# Patient Record
Sex: Female | Born: 1955 | Race: White | Hispanic: No | Marital: Single | Smoking: Former smoker
Health system: Southern US, Community
[De-identification: ages and names within clinical notes are randomized; demographics above are authoritative.]

## PROBLEM LIST (undated history)

## (undated) DIAGNOSIS — Z923 Personal history of irradiation: Secondary | ICD-10-CM

## (undated) DIAGNOSIS — I7300 Raynaud's syndrome without gangrene: Secondary | ICD-10-CM

## (undated) DIAGNOSIS — C089 Malignant neoplasm of major salivary gland, unspecified: Secondary | ICD-10-CM

## (undated) DIAGNOSIS — F329 Major depressive disorder, single episode, unspecified: Secondary | ICD-10-CM

## (undated) DIAGNOSIS — F32A Depression, unspecified: Secondary | ICD-10-CM

## (undated) DIAGNOSIS — I10 Essential (primary) hypertension: Secondary | ICD-10-CM

## (undated) DIAGNOSIS — K589 Irritable bowel syndrome without diarrhea: Secondary | ICD-10-CM

## (undated) DIAGNOSIS — K219 Gastro-esophageal reflux disease without esophagitis: Secondary | ICD-10-CM

## (undated) DIAGNOSIS — K922 Gastrointestinal hemorrhage, unspecified: Secondary | ICD-10-CM

## (undated) DIAGNOSIS — E785 Hyperlipidemia, unspecified: Secondary | ICD-10-CM

## (undated) HISTORY — DX: Major depressive disorder, single episode, unspecified: F32.9

## (undated) HISTORY — DX: Essential (primary) hypertension: I10

## (undated) HISTORY — DX: Depression, unspecified: F32.A

## (undated) HISTORY — DX: Gastro-esophageal reflux disease without esophagitis: K21.9

## (undated) HISTORY — PX: PAROTIDECTOMY: SUR1003

## (undated) HISTORY — DX: Irritable bowel syndrome, unspecified: K58.9

## (undated) HISTORY — DX: Raynaud's syndrome without gangrene: I73.00

## (undated) HISTORY — DX: Hyperlipidemia, unspecified: E78.5

## (undated) HISTORY — DX: Malignant neoplasm of major salivary gland, unspecified: C08.9

---

## 1997-06-09 DIAGNOSIS — C089 Malignant neoplasm of major salivary gland, unspecified: Secondary | ICD-10-CM

## 1997-06-09 DIAGNOSIS — Z923 Personal history of irradiation: Secondary | ICD-10-CM

## 1997-06-09 HISTORY — DX: Personal history of irradiation: Z92.3

## 1997-06-09 HISTORY — DX: Malignant neoplasm of major salivary gland, unspecified: C08.9

## 1997-06-09 HISTORY — PX: SALIVARY GLAND SURGERY: SHX768

## 2002-06-09 HISTORY — PX: ABDOMINAL HYSTERECTOMY: SHX81

## 2004-10-21 ENCOUNTER — Ambulatory Visit: Payer: Self-pay | Admitting: Psychiatry

## 2005-08-05 ENCOUNTER — Inpatient Hospital Stay: Payer: Self-pay | Admitting: Obstetrics and Gynecology

## 2007-03-24 ENCOUNTER — Emergency Department: Payer: Self-pay | Admitting: Emergency Medicine

## 2008-10-09 ENCOUNTER — Ambulatory Visit: Payer: Self-pay | Admitting: Family Medicine

## 2008-12-05 ENCOUNTER — Emergency Department: Payer: Self-pay | Admitting: Emergency Medicine

## 2009-11-07 ENCOUNTER — Ambulatory Visit: Payer: Self-pay | Admitting: Internal Medicine

## 2009-12-17 LAB — HM DEXA SCAN

## 2010-03-05 ENCOUNTER — Ambulatory Visit: Payer: Self-pay | Admitting: Emergency Medicine

## 2010-05-13 ENCOUNTER — Ambulatory Visit: Payer: Self-pay | Admitting: Gastroenterology

## 2010-07-01 ENCOUNTER — Ambulatory Visit: Payer: Self-pay | Admitting: Gastroenterology

## 2011-01-07 ENCOUNTER — Ambulatory Visit: Payer: Self-pay | Admitting: Internal Medicine

## 2011-10-29 ENCOUNTER — Ambulatory Visit: Payer: Self-pay | Admitting: Unknown Physician Specialty

## 2011-11-06 ENCOUNTER — Encounter: Payer: Self-pay | Admitting: Family Medicine

## 2011-11-06 ENCOUNTER — Ambulatory Visit (INDEPENDENT_AMBULATORY_CARE_PROVIDER_SITE_OTHER): Payer: 59 | Admitting: Family Medicine

## 2011-11-06 VITALS — BP 130/90 | HR 88 | Temp 97.9°F | Ht 65.0 in | Wt 140.0 lb

## 2011-11-06 DIAGNOSIS — E785 Hyperlipidemia, unspecified: Secondary | ICD-10-CM | POA: Insufficient documentation

## 2011-11-06 DIAGNOSIS — K219 Gastro-esophageal reflux disease without esophagitis: Secondary | ICD-10-CM | POA: Insufficient documentation

## 2011-11-06 DIAGNOSIS — I10 Essential (primary) hypertension: Secondary | ICD-10-CM

## 2011-11-06 DIAGNOSIS — E119 Type 2 diabetes mellitus without complications: Secondary | ICD-10-CM

## 2011-11-06 DIAGNOSIS — F329 Major depressive disorder, single episode, unspecified: Secondary | ICD-10-CM

## 2011-11-06 DIAGNOSIS — F32A Depression, unspecified: Secondary | ICD-10-CM | POA: Insufficient documentation

## 2011-11-06 MED ORDER — HYDROCHLOROTHIAZIDE 12.5 MG PO CAPS: 12.5000 mg | ORAL_CAPSULE | Freq: Every day | ORAL | Status: DC

## 2011-11-06 NOTE — Progress Notes (Signed)
Subjective:    Patient ID: Krystal Walker, female    DOB: Feb 13, 1956, 56 y.o.   MRN: [id number]  HPI  56 yo here to establish care.  HTN- has been on and off medication for years.  Stopped taking everything because she felt overmedicated.  Last anti hypertensive was amlodipine. She does not think she had any adverse reactions to any of them.  Dr. Mikey Bussing office called and asked that we work her in because her BP was 158/101 in his office. She has been having more headaches. No blurred vision, CP. Does have some mild LE edema.  OA- undergoing PT for her right hip pain. Was told she could not participate in PT under BP under better control.  Depression- h/o prior mental health hospitalizations.  Most recently on effexor.  Feels symptoms are well controlled off medications. Would prefer "not to take man made medications when possible."  HLD- was previously on statin.  Advised to stop taking it because it "was causing muscle break down." Lipids normal in 05/2011.  Patient Active Problem List  Diagnoses  . Hypertension  . Hyperlipidemia  . Depression  . GERD (gastroesophageal reflux disease)   Past Medical History  Diagnosis Date  . Hypertension   . Hyperlipidemia   . Diabetes mellitus   . Depression   . IBS (irritable bowel syndrome)   . Osteoporosis   . Raynaud disease   . GERD (gastroesophageal reflux disease)   . 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 Abdominal hysterectomy    History  Substance Use Topics  . Smoking status: Former Games developer  . Smokeless tobacco: Not on file  . Alcohol Use: Not on file   No family history on file. Allergies  Allergen Reactions  . Morphine And Related Nausea And Vomiting   Current Outpatient Prescriptions on File Prior to Visit  Medication Sig Dispense Refill  . CALCIUM-VITAMIN D PO Take one by mouth daily      . topiramate (TOPAMAX) 25 MG tablet Take 25 mg by  mouth daily.      . hydrochlorothiazide (MICROZIDE) 12.5 MG capsule Take 1 capsule (12.5 mg total) by mouth daily.  30 capsule  1   The PMH, PSH, Social History, Family History, Medications, and allergies have been reviewed in [facility], and have been updated if relevant.   Review of Systems See HPI Patient reports no  vision/ hearing changes,anorexia, weight change, fever ,adenopathy, persistant / recurrent hoarseness, swallowing issues, chest pain, edema,persistant / recurrent cough, hemoptysis, dyspnea(rest, exertional, paroxysmal nocturnal), gastrointestinal  bleeding (melena, rectal bleeding)    Objective:   Physical Exam BP 130/90  Pulse 88  Temp(Src) 97.9 F (36.6 C) (Oral)  Ht 5\' 5"  (1.651 m)  Wt 140 lb (63.504 kg)  BMI 23.30 kg/m2  General:  Well-developed,well-nourished,in no acute distress; alert,appropriate and cooperative throughout examination Head:  normocephalic and atraumatic.   Eyes:  vision grossly intact, pupils equal, pupils round, and pupils reactive to light.   Ears:  R ear normal and L ear normal.   Nose:  no external deformity.   Mouth:  good dentition.   Neck:  No deformities, masses, or tenderness noted. Lungs:  Normal respiratory effort, chest expands symmetrically. Lungs are clear to auscultation, no crackles or wheezes. Heart:  Normal rate and regular rhythm. S1 and S2 normal without gallop, murmur, click, rub or other extra sounds. Abdomen:  Bowel sounds positive,abdomen soft and non-tender without  masses, organomegaly or hernias noted. Msk:  No deformity or scoliosis noted of thoracic or lumbar spine.   Extremities:  No clubbing, cyanosis, edema, or deformity noted with normal full range of motion of all joints.   Neurologic:  alert & oriented X3 and gait normal.   Skin:  Intact without suspicious lesions or rashes Cervical Nodes:  No lymphadenopathy noted Axillary Nodes:  No palpable lymphadenopathy Psych:  Cognition and judgment appear intact. Very  tangential speech, difficulty focusing on one topic.     Assessment & Plan:   1. Hypertension  Deteriorated although BP quite good in office today. >30 min spent with face to face with patient, >50% counseling and/or coordinating care Will start low dose HCTZ 12.5 mg daily. Follow up in one month.  2. Hyperlipidemia  Off statin. Recheck lipids in 3 months (will need order as she works for [employer]).  3. GERD (gastroesophageal reflux disease)  Stable.  4. Depression  Stable off meds.

## 2011-11-06 NOTE — Patient Instructions (Addendum)
It was nice to meet you. We are starting hydrochlorothiazide 12.5 mg daily. Please come back in 1 month for follow up blood pressure.  We will plan to make an appointment for a physical this summer.

## 2011-12-08 ENCOUNTER — Encounter: Payer: Self-pay | Admitting: Family Medicine

## 2011-12-08 ENCOUNTER — Ambulatory Visit (INDEPENDENT_AMBULATORY_CARE_PROVIDER_SITE_OTHER): Payer: 59 | Admitting: Family Medicine

## 2011-12-08 VITALS — BP 110/64 | HR 88 | Temp 97.9°F | Wt 138.2 lb

## 2011-12-08 DIAGNOSIS — I10 Essential (primary) hypertension: Secondary | ICD-10-CM

## 2011-12-08 MED ORDER — OMEPRAZOLE 40 MG PO CPDR: 40.0000 mg | DELAYED_RELEASE_CAPSULE | Freq: Every day | ORAL | Status: DC

## 2011-12-08 MED ORDER — HYDROCHLOROTHIAZIDE 12.5 MG PO CAPS: 12.5000 mg | ORAL_CAPSULE | Freq: Every day | ORAL | Status: DC

## 2011-12-08 NOTE — Progress Notes (Signed)
Subjective:    Patient ID: Krystal Walker, female    DOB: June 02, 1956, 56 y.o.   MRN: [id number]  HPI  56 yo here for follow up HTN.  HTN- has been on and off medication for years.  Stopped taking everything because she felt overmedicated.  Last anti hypertensive was amlodipine. She does not think she had any adverse reactions to any of them.  Started HCTZ 12.5 mg daily last month. Normotensive today.  Denies any Side effects. No CP, SOB, HA, blurred vision or LE edema.  Patient Active Problem List  Diagnosis  . Hypertension  . Hyperlipidemia  . Depression  . GERD (gastroesophageal reflux disease)   Past Medical History  Diagnosis Date  . Hypertension   . Hyperlipidemia   . Diabetes mellitus   . Depression   . IBS (irritable bowel syndrome)   . Osteoporosis   . Raynaud disease   . GERD (gastroesophageal reflux disease)   . 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 Abdominal hysterectomy    History  Substance Use Topics  . Smoking status: Former Games developer  . Smokeless tobacco: Not on file  . Alcohol Use: No   No family history on file. Allergies  Allergen Reactions  . Latex   . Morphine And Related Nausea And Vomiting   Current Outpatient Prescriptions on File Prior to Visit  Medication Sig Dispense Refill  . CALCIUM-VITAMIN D PO Take one by mouth daily      . hydrochlorothiazide (MICROZIDE) 12.5 MG capsule Take 1 capsule (12.5 mg total) by mouth daily.  30 capsule  1  . Multiple Vitamin (MULTIVITAMIN) tablet Take 1 tablet by mouth daily.      Marland Kitchen omeprazole (PRILOSEC) 40 MG capsule Take 40 mg by mouth daily.      . Probiotic Product (ALIGN PO) Take by mouth.      . topiramate (TOPAMAX) 25 MG tablet Take 25 mg by mouth daily.       The PMH, PSH, Social History, Family History, Medications, and allergies have been reviewed in [facility], and have been updated if relevant.   Review of Systems See HPI       Objective:   Physical Exam BP 110/64  Pulse 88  Temp 97.9 F (36.6 C) (Oral)  Wt 138 lb 4 oz (62.71 kg)  General:  Well-developed,well-nourished,in no acute distress; alert,appropriate and cooperative throughout examination Head:  normocephalic and atraumatic.   Eyes:  vision grossly intact, pupils equal, pupils round, and pupils reactive to light.   Ears:  R ear normal and L ear normal.   Nose:  no external deformity.   Mouth:  good dentition.   Neck:  No deformities, masses, or tenderness noted. Lungs:  Normal respiratory effort, chest expands symmetrically. Lungs are clear to auscultation, no crackles or wheezes. Heart:  Normal rate and regular rhythm. S1 and S2 normal without gallop, murmur, click, rub or other extra sounds. Extremities:  No clubbing, cyanosis, edema, or deformity noted with normal full range of motion of all joints.   Neurologic:  alert & oriented X3 and gait normal.   Skin:  Intact without suspicious lesions or rashes Psych:  Cognition and judgment appear intact. Very tangential speech, difficulty focusing on one topic.     Assessment & Plan:   1. Hypertension  Improved. Continue low dose HCTZ. Follow up prn. The patient indicates understanding of these issues and agrees with the plan.

## 2012-01-05 ENCOUNTER — Ambulatory Visit: Payer: Self-pay | Admitting: Family Medicine

## 2012-01-12 ENCOUNTER — Telehealth: Payer: Self-pay

## 2012-01-12 DIAGNOSIS — Z1231 Encounter for screening mammogram for malignant neoplasm of breast: Secondary | ICD-10-CM

## 2012-01-12 NOTE — Telephone Encounter (Signed)
Pt got notice from [facility] for pt to contact our office time for mammogram. Pt got letter in June and forgot to call office. Pt wants to know if she needs mammogram and when should she get CPX.Please advise.

## 2012-01-13 ENCOUNTER — Telehealth: Payer: Self-pay | Admitting: Family Medicine

## 2012-01-13 NOTE — Telephone Encounter (Signed)
Left message for patient to call back  

## 2012-01-13 NOTE — Telephone Encounter (Signed)
Patient scheduled her cpx for 03/15/12.  She has to have her lab work done at [facility].   She asked if you could mail the lab order to her.

## 2012-01-13 NOTE — Telephone Encounter (Signed)
Advised patient, call sent to [facility] to schedule.

## 2012-01-13 NOTE — Telephone Encounter (Signed)
Referral placed.   She established care with me in May, does not appear that she had physical this year at her previous doctor's office so yes she can schedule a physical.

## 2012-01-14 NOTE — Telephone Encounter (Signed)
Order mailed to patient.

## 2012-01-14 NOTE — Telephone Encounter (Signed)
Orders written, on my desk.

## 2012-01-20 ENCOUNTER — Ambulatory Visit: Payer: Self-pay | Admitting: Family Medicine

## 2012-01-21 ENCOUNTER — Encounter: Payer: Self-pay | Admitting: Family Medicine

## 2012-01-26 ENCOUNTER — Ambulatory Visit: Payer: Self-pay | Admitting: Family Medicine

## 2012-01-26 ENCOUNTER — Encounter: Payer: Self-pay | Admitting: Family Medicine

## 2012-01-27 ENCOUNTER — Other Ambulatory Visit: Payer: Self-pay | Admitting: Family Medicine

## 2012-01-27 DIAGNOSIS — R928 Other abnormal and inconclusive findings on diagnostic imaging of breast: Secondary | ICD-10-CM

## 2012-02-02 ENCOUNTER — Other Ambulatory Visit: Payer: Self-pay | Admitting: *Deleted

## 2012-02-02 MED ORDER — TOPIRAMATE 25 MG PO TABS: 25.0000 mg | ORAL_TABLET | Freq: Every day | ORAL | Status: DC

## 2012-02-02 NOTE — Telephone Encounter (Signed)
Pt requests refills on topiramate.

## 2012-02-13 ENCOUNTER — Telehealth: Payer: Self-pay | Admitting: Family Medicine

## 2012-02-13 MED ORDER — FLUCONAZOLE 150 MG PO TABS: 150.0000 mg | ORAL_TABLET | Freq: Once | ORAL | Status: AC

## 2012-02-13 NOTE — Telephone Encounter (Signed)
Rx for Diflucan sent to [facility].

## 2012-02-13 NOTE — Telephone Encounter (Signed)
Pt just got off of anti-biotics and has a yeast infection and was wondering if something could be called in to [facility] on [address]. For that.

## 2012-02-13 NOTE — Telephone Encounter (Signed)
Advised patient

## 2012-02-23 ENCOUNTER — Ambulatory Visit: Payer: Self-pay | Admitting: General Surgery

## 2012-02-23 HISTORY — PX: BREAST BIOPSY: SHX20

## 2012-02-24 LAB — PATHOLOGY REPORT

## 2012-03-02 ENCOUNTER — Encounter: Payer: Self-pay | Admitting: Family Medicine

## 2012-03-15 ENCOUNTER — Ambulatory Visit (INDEPENDENT_AMBULATORY_CARE_PROVIDER_SITE_OTHER): Payer: 59 | Admitting: Family Medicine

## 2012-03-15 ENCOUNTER — Encounter: Payer: Self-pay | Admitting: Family Medicine

## 2012-03-15 VITALS — BP 110/80 | HR 72 | Temp 97.7°F | Ht 65.0 in | Wt 130.0 lb

## 2012-03-15 DIAGNOSIS — Z0000 Encounter for general adult medical examination without abnormal findings: Secondary | ICD-10-CM | POA: Insufficient documentation

## 2012-03-15 DIAGNOSIS — F329 Major depressive disorder, single episode, unspecified: Secondary | ICD-10-CM

## 2012-03-15 DIAGNOSIS — E785 Hyperlipidemia, unspecified: Secondary | ICD-10-CM

## 2012-03-15 DIAGNOSIS — I10 Essential (primary) hypertension: Secondary | ICD-10-CM

## 2012-03-15 DIAGNOSIS — K219 Gastro-esophageal reflux disease without esophagitis: Secondary | ICD-10-CM

## 2012-03-15 MED ORDER — PRAVASTATIN SODIUM 10 MG PO TABS: 10.0000 mg | ORAL_TABLET | Freq: Every day | ORAL | Status: DC

## 2012-03-15 NOTE — Progress Notes (Signed)
Subjective:    Patient ID: Krystal Walker, female    DOB: 11/09/1955, 56 y.o.   MRN: [id number]  HPI  56 yo here for CPX.  HTN- has been on and off medication for years.    Started HCTZ 12.5 mg daily this summer. Normotensive today.  Denies any Side effects. No CP, SOB, HA, blurred vision or LE edema.  S/p hysterectomy.  Had left breast biopsy two weeks ago- Dr. Evette Cristal- was neg.  Scheduled follow up mammogram in 6 months.  HLD- LDL elevated to 167.  Has been on statins in past- made her muscles ache.   Patient Active Problem List  Diagnosis  . Hypertension  . Hyperlipidemia  . Depression  . GERD (gastroesophageal reflux disease)  . Routine general medical examination at a health care facility   Past Medical History  Diagnosis Date  . Hypertension   . Hyperlipidemia   . Diabetes mellitus   . Depression   . IBS (irritable bowel syndrome)   . Osteoporosis   . Raynaud disease   . GERD (gastroesophageal reflux disease)   . 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 Abdominal hysterectomy    History  Substance Use Topics  . Smoking status: Former Games developer  . Smokeless tobacco: Not on file  . Alcohol Use: No   No family history on file. Allergies  Allergen Reactions  . Latex   . Morphine And Related Nausea And Vomiting   Current Outpatient Prescriptions on File Prior to Visit  Medication Sig Dispense Refill  . CALCIUM-VITAMIN D PO Take one by mouth daily      . hydrochlorothiazide (MICROZIDE) 12.5 MG capsule Take 1 capsule (12.5 mg total) by mouth daily.  90 capsule  2  . Multiple Vitamin (MULTIVITAMIN) tablet Take 1 tablet by mouth daily.      Marland Kitchen omeprazole (PRILOSEC) 40 MG capsule Take 1 capsule (40 mg total) by mouth daily.  90 capsule  2  . Probiotic Product (ALIGN PO) Take by mouth.      . topiramate (TOPAMAX) 25 MG tablet Take 1 tablet (25 mg total) by mouth daily.  90 tablet  3   The PMH, PSH,  Social History, Family History, Medications, and allergies have been reviewed in [facility], and have been updated if relevant.   Review of Systems See HPI     Objective:   Physical Exam BP 110/80  Pulse 72  Temp 97.7 F (36.5 C) (Oral)  Ht 5\' 5"  (1.651 m)  Wt 130 lb (58.968 kg)  BMI 21.63 kg/m2  General:  Well-developed,well-nourished,in no acute distress; alert,appropriate and cooperative throughout examination Head:  normocephalic and atraumatic.   Eyes:  vision grossly intact, pupils equal, pupils round, and pupils reactive to light.   Ears:  R ear normal and L ear normal.   Nose:  no external deformity.   Mouth:  good dentition.   Neck:  No deformities, masses, or tenderness noted. Breasts:  Left breast echymosis- small palpable mass below nipple (area recently biopsied). Lungs:  Normal respiratory effort, chest expands symmetrically. Lungs are clear to auscultation, no crackles or wheezes. Heart:  Normal rate and regular rhythm. S1 and S2 normal without gallop, murmur, click, rub or other extra sounds. Extremities:  No clubbing, cyanosis, edema, or deformity noted with normal full range of motion of all joints.   Neurologic:  alert & oriented X3 and gait normal.   Skin:  Intact  without suspicious lesions or rashes Psych:  Cognition and judgment appear intact. Very tangential speech, difficulty focusing on one topic.     Assessment & Plan:   1. Hyperlipidemia  Deteriorated. Start Pravachol.  Follow up labs in 8 weeks- rx given to [facility].  2. Hypertension  Stable on HCTZ.  3. GERD (gastroesophageal reflux disease)  Quiet.  4. Routine general medical examination at a health care facility  Reviewed preventive care protocols, scheduled due services, and updated immunizations Discussed nutrition, exercise, diet, and healthy lifestyle.

## 2012-03-15 NOTE — Patient Instructions (Addendum)
We are adding Pravastatin 10 mg daily. Recheck your labs in 8 weeks.  Health Maintenance, Females A healthy lifestyle and preventative care can promote health and wellness.  Maintain regular health, dental, and eye exams.  Eat a healthy diet. Foods like vegetables, fruits, whole grains, low-fat dairy products, and lean protein foods contain the nutrients you need without too many calories. Decrease your intake of foods high in solid fats, added sugars, and salt. Get information about a proper diet from your caregiver, if necessary.  Regular physical exercise is one of the most important things you can do for your health. Most adults should get at least 150 minutes of moderate-intensity exercise (any activity that increases your heart rate and causes you to sweat) each week. In addition, most adults need muscle-strengthening exercises on 2 or more days a week.   Maintain a healthy weight. The body mass index (BMI) is a screening tool to identify possible weight problems. It provides an estimate of body fat based on height and weight. Your caregiver can help determine your BMI, and can help you achieve or maintain a healthy weight. For adults 20 years and older:  A BMI below 18.5 is considered underweight.  A BMI of 18.5 to 24.9 is normal.  A BMI of 25 to 29.9 is considered overweight.  A BMI of 30 and above is considered obese.  Maintain normal blood lipids and cholesterol by exercising and minimizing your intake of saturated fat. Eat a balanced diet with plenty of fruits and vegetables. Blood tests for lipids and cholesterol should begin at age 89 and be repeated every 5 years. If your lipid or cholesterol levels are high, you are over 50, or you are a high risk for heart disease, you may need your cholesterol levels checked more frequently.Ongoing high lipid and cholesterol levels should be treated with medicines if diet and exercise are not effective.  If you smoke, find out from your  caregiver how to quit. If you do not use tobacco, do not start.  If you are pregnant, do not drink alcohol. If you are breastfeeding, be very cautious about drinking alcohol. If you are not pregnant and choose to drink alcohol, do not exceed 1 drink per day. One drink is considered to be 12 ounces (355 mL) of beer, 5 ounces (148 mL) of wine, or 1.5 ounces (44 mL) of liquor.  Avoid use of street drugs. Do not share needles with anyone. Ask for help if you need support or instructions about stopping the use of drugs.  High blood pressure causes heart disease and increases the risk of stroke. Blood pressure should be checked at least every 1 to 2 years. Ongoing high blood pressure should be treated with medicines, if weight loss and exercise are not effective.  If you are 83 to 57 years old, ask your caregiver if you should take aspirin to prevent strokes.  Diabetes screening involves taking a blood sample to check your fasting blood sugar level. This should be done once every 3 years, after age 58, if you are within normal weight and without risk factors for diabetes. Testing should be considered at a younger age or be carried out more frequently if you are overweight and have at least 1 risk factor for diabetes.  Breast cancer screening is essential preventative care for women. You should practice "breast self-awareness." This means understanding the normal appearance and feel of your breasts and may include breast self-examination. Any changes detected, no matter how small,  should be reported to a caregiver. Women in their 41s and 30s should have a clinical breast exam (CBE) by a caregiver as part of a regular health exam every 1 to 3 years. After age 80, women should have a CBE every year. Starting at age 3, women should consider having a mammogram (breast X-ray) every year. Women who have a family history of breast cancer should talk to their caregiver about genetic screening. Women at a high risk of  breast cancer should talk to their caregiver about having an MRI and a mammogram every year.  The Pap test is a screening test for cervical cancer. Women should have a Pap test starting at age 7. Between ages 87 and 39, Pap tests should be repeated every 2 years. Beginning at age 26, you should have a Pap test every 3 years as long as the past 3 Pap tests have been normal. If you had a hysterectomy for a problem that was not cancer or a condition that could lead to cancer, then you no longer need Pap tests. If you are between ages 60 and 6, and you have had normal Pap tests going back 10 years, you no longer need Pap tests. If you have had past treatment for cervical cancer or a condition that could lead to cancer, you need Pap tests and screening for cancer for at least 20 years after your treatment. If Pap tests have been discontinued, risk factors (such as a new sexual partner) need to be reassessed to determine if screening should be resumed. Some women have medical problems that increase the chance of getting cervical cancer. In these cases, your caregiver may recommend more frequent screening and Pap tests.  The human papillomavirus (HPV) test is an additional test that may be used for cervical cancer screening. The HPV test looks for the virus that can cause the cell changes on the cervix. The cells collected during the Pap test can be tested for HPV. The HPV test could be used to screen women aged 49 years and older, and should be used in women of any age who have unclear Pap test results. After the age of 73, women should have HPV testing at the same frequency as a Pap test.  Colorectal cancer can be detected and often prevented. Most routine colorectal cancer screening begins at the age of 50 and continues through age 93. However, your caregiver may recommend screening at an earlier age if you have risk factors for colon cancer. On a yearly basis, your caregiver may provide home test kits to check  for hidden blood in the stool. Use of a small camera at the end of a tube, to directly examine the colon (sigmoidoscopy or colonoscopy), can detect the earliest forms of colorectal cancer. Talk to your caregiver about this at age 45, when routine screening begins. Direct examination of the colon should be repeated every 5 to 10 years through age 71, unless early forms of pre-cancerous polyps or small growths are found.  Hepatitis C blood testing is recommended for all people born from [birth year] through 1966 and any individual with known risks for hepatitis C.  Practice safe sex. Use condoms and avoid high-risk sexual practices to reduce the spread of sexually transmitted infections (STIs). Sexually active women aged 41 and younger should be checked for Chlamydia, which is a common sexually transmitted infection. Older women with new or multiple partners should also be tested for Chlamydia. Testing for other STIs is recommended if you are  sexually active and at increased risk.  Osteoporosis is a disease in which the bones lose minerals and strength with aging. This can result in serious bone fractures. The risk of osteoporosis can be identified using a bone density scan. Women ages 29 and over and women at risk for fractures or osteoporosis should discuss screening with their caregivers. Ask your caregiver whether you should be taking a calcium supplement or vitamin D to reduce the rate of osteoporosis.  Menopause can be associated with physical symptoms and risks. Hormone replacement therapy is available to decrease symptoms and risks. You should talk to your caregiver about whether hormone replacement therapy is right for you.  Use sunscreen with a sun protection factor (SPF) of 30 or greater. Apply sunscreen liberally and repeatedly throughout the day. You should seek shade when your shadow is shorter than you. Protect yourself by wearing long sleeves, pants, a wide-brimmed hat, and sunglasses year round,  whenever you are outdoors.  Notify your caregiver of new moles or changes in moles, especially if there is a change in shape or color. Also notify your caregiver if a mole is larger than the size of a pencil eraser.  Stay current with your immunizations. Document Released: 12/09/2010 Document Revised: 08/18/2011 Document Reviewed: 12/09/2010 [facility] Patient Information 2013 [place], [place].

## 2012-04-01 ENCOUNTER — Encounter: Payer: Self-pay | Admitting: Family Medicine

## 2012-04-12 ENCOUNTER — Ambulatory Visit (INDEPENDENT_AMBULATORY_CARE_PROVIDER_SITE_OTHER): Payer: 59 | Admitting: Family Medicine

## 2012-04-12 ENCOUNTER — Encounter: Payer: Self-pay | Admitting: Family Medicine

## 2012-04-12 VITALS — BP 118/88 | HR 125 | Temp 98.2°F | Wt 129.0 lb

## 2012-04-12 DIAGNOSIS — I7300 Raynaud's syndrome without gangrene: Secondary | ICD-10-CM | POA: Insufficient documentation

## 2012-04-12 DIAGNOSIS — M549 Dorsalgia, unspecified: Secondary | ICD-10-CM

## 2012-04-12 DIAGNOSIS — K589 Irritable bowel syndrome without diarrhea: Secondary | ICD-10-CM

## 2012-04-12 MED ORDER — MELOXICAM 15 MG PO TABS: 15.0000 mg | ORAL_TABLET | Freq: Every day | ORAL | Status: DC

## 2012-04-12 MED ORDER — CYCLOBENZAPRINE HCL 10 MG PO TABS: 10.0000 mg | ORAL_TABLET | Freq: Every day | ORAL | Status: DC

## 2012-04-12 MED ORDER — TRAMADOL HCL 50 MG PO TABS: 50.0000 mg | ORAL_TABLET | Freq: Four times a day (QID) | ORAL | Status: DC | PRN

## 2012-04-12 NOTE — Patient Instructions (Signed)
Stop the hydrocodone that you are taking Stop the aspirin / caffeine combination

## 2012-04-12 NOTE — Progress Notes (Signed)
Patient Name: Krystal Walker Date of Birth: 10/20/1955 Medical Record Number: [id number] Gender: female  PCP: Ruthe Mannan, MD  History of Present Illness:  Krystal Walker is a 56 y.o. very pleasant female patient who presents with the following: Back Pain  ongoing for approximately: on and off for weeks to months The patient has had back pain before. The back pain is localized into the lumbar spine area, but some in the thoracic. They also describe no radiculopathy.  Taking Bayer back and body and taking some vicodin. Took a pound of barbecue and then got some significant IBS flare.  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  Wanted to do the forever fit program at [facility]. Next day, felt like her coccyx ws drilling oin to the ground and  Went to [facility] before with some hip pain, but has only rare been doing her HEP.  IBS sx bothering her a great deal now and constipation  Reynaud's phenomenon - worsening and having some symptoms with going into the cold  No numbness or tingling. No bowel or bladder incontinence. No focal weakness. Prior interventions: none Physical therapy: yes for hip PT, but doing some core Chiropractic manipulations: No Acupuncture: No Osteopathic manipulation: No Heat or cold: Minimal effect  Past Medical History, Surgical History, Family History, Medications, Allergies have been reviewed and updated if relevant.  Review of Systems  GEN: No fevers, chills. Nontoxic. Primarily MSK c/o today. MSK: Detailed in the HPI GI: tolerating PO intake without difficulty - as above Neuro: As above  Otherwise the pertinent positives of the ROS are noted above.    Physical Exam  Filed Vitals:   04/12/12 0907  BP: 118/88  Pulse: 125  Temp: 98.2 F (36.8 C)  TempSrc: Oral  Weight: 129 lb (58.514 kg)  SpO2: 99%    Gen: Well-developed,well-nourished,in no acute distress; alert,appropriate and cooperative throughout examination HEENT: Normocephalic and atraumatic  without obvious abnormalities.  Ears, externally no deformities Pulm: Breathing comfortably in no respiratory distress Range of motion at  the waist: Flexion, rotation and lateral bending: full  No echymosis or edema Rises to examination table with no difficulty Gait: non antalgic  Inspection/Deformity: No abnormality Paraspinus T:  Minimally tender  B Ankle Dorsiflexion (L5,4): 5/5 B Great Toe Dorsiflexion (L5,4): 5/5 Heel Walk (L5): WNL Toe Walk (S1): WNL Rise/Squat (L4): WNL  SENSORY B Medial Foot (L4): WNL B Dorsum (L5): WNL B Lateral (S1): WNL Light Touch: WNL  REFLEXES Knee (L4): 2+ Ankle (S1): 2+  B SLR, seated: neg B SLR, supine: neg B FABER: neg B Reverse FABER: neg B Greater Troch: NT B Log Roll: neg B Stork: NT B Sciatic Notch: NT   Assessment and Plan:    1. Back pain   2. Raynaud phenomenon   3. IBS (irritable bowel syndrome)     I reviewed with the patient the structures involved and how they related to diagnosis.   Conservative algorithms for acute back pain generally begin with the following: NSAIDS Muscle Relaxants Mild pain medication Start with medications, core rehab, and progress from there following low back pain algorithm. No red flags are present.  IBS: likely from vicodin use recently. D/c this. Change to meds below, basic ibs care  Raynaud's: Ca channel blocker could be considered by PCP, as pt already on HCTZ  Meds ordered this encounter  Medications  . meloxicam (MOBIC) 15 MG tablet    Sig: Take 1 tablet (15 mg total) by mouth daily.    Dispense:  30 tablet    Refill:  2  . traMADol (ULTRAM) 50 MG tablet    Sig: Take 1 tablet (50 mg total) by mouth every 6 (six) hours as needed for pain.    Dispense:  50 tablet    Refill:  2  . cyclobenzaprine (FLEXERIL) 10 MG tablet    Sig: Take 1 tablet (10 mg total) by mouth at bedtime.    Dispense:  30 tablet    Refill:  1

## 2012-04-30 ENCOUNTER — Other Ambulatory Visit: Payer: Self-pay | Admitting: *Deleted

## 2012-04-30 MED ORDER — MELOXICAM 15 MG PO TABS: 15.0000 mg | ORAL_TABLET | Freq: Every day | ORAL | Status: DC

## 2012-04-30 NOTE — Telephone Encounter (Signed)
Faxed refill request for 90 day supply from mail order

## 2012-05-10 ENCOUNTER — Other Ambulatory Visit: Payer: Self-pay | Admitting: Family Medicine

## 2012-05-17 ENCOUNTER — Telehealth: Payer: Self-pay

## 2012-05-17 NOTE — Telephone Encounter (Signed)
Pt left v/m returning call to Jacki Cones; call pt [phone].

## 2012-05-17 NOTE — Telephone Encounter (Signed)
Rx written and on my desk. 

## 2012-05-17 NOTE — Telephone Encounter (Signed)
Advised patient of lab results from [facility].  She needs a written order to take to [facility] to recheck BMET today.

## 2012-05-17 NOTE — Telephone Encounter (Signed)
Order placed at front desk for pick up. 

## 2012-05-20 ENCOUNTER — Encounter: Payer: Self-pay | Admitting: Family Medicine

## 2012-05-21 ENCOUNTER — Encounter: Payer: Self-pay | Admitting: Family Medicine

## 2012-05-21 ENCOUNTER — Telehealth: Payer: Self-pay | Admitting: Family Medicine

## 2012-05-21 NOTE — Telephone Encounter (Signed)
Copies of lab reports from 12/02 and 12/09 are on your desk.

## 2012-05-21 NOTE — Telephone Encounter (Signed)
Krystal Walker, please see pt advise note.  I cannot find labs she is asking about in Epic.

## 2012-05-28 ENCOUNTER — Encounter: Payer: Self-pay | Admitting: Family Medicine

## 2012-06-07 ENCOUNTER — Encounter: Payer: Self-pay | Admitting: Family Medicine

## 2012-06-07 NOTE — Telephone Encounter (Signed)
See note below.  Labs printed for patient pick up.

## 2012-06-07 NOTE — Telephone Encounter (Signed)
Also, patient wants order to tdap to take to her work.  She has a new grandbaby due next month.

## 2012-06-07 NOTE — Telephone Encounter (Signed)
Order given to patient for tdap, by Dr. Dayton Martes.

## 2012-06-24 ENCOUNTER — Ambulatory Visit (INDEPENDENT_AMBULATORY_CARE_PROVIDER_SITE_OTHER): Payer: 59 | Admitting: *Deleted

## 2012-06-24 DIAGNOSIS — Z23 Encounter for immunization: Secondary | ICD-10-CM

## 2012-07-17 ENCOUNTER — Encounter: Payer: Self-pay | Admitting: General Surgery

## 2012-07-24 ENCOUNTER — Other Ambulatory Visit: Payer: Self-pay

## 2012-08-16 ENCOUNTER — Ambulatory Visit (INDEPENDENT_AMBULATORY_CARE_PROVIDER_SITE_OTHER)
Admission: RE | Admit: 2012-08-16 | Discharge: 2012-08-16 | Disposition: A | Discharge status: Home / Self Care | Payer: 59 | Source: Ambulatory Visit | Attending: Family Medicine | Admitting: Family Medicine

## 2012-08-16 ENCOUNTER — Encounter: Payer: Self-pay | Admitting: Family Medicine

## 2012-08-16 ENCOUNTER — Ambulatory Visit (INDEPENDENT_AMBULATORY_CARE_PROVIDER_SITE_OTHER): Payer: 59 | Admitting: Family Medicine

## 2012-08-16 VITALS — BP 120/64 | HR 108 | Temp 98.6°F | Ht 64.0 in | Wt 127.0 lb

## 2012-08-16 DIAGNOSIS — M549 Dorsalgia, unspecified: Secondary | ICD-10-CM

## 2012-08-16 DIAGNOSIS — M546 Pain in thoracic spine: Secondary | ICD-10-CM

## 2012-08-16 MED ORDER — AMITRIPTYLINE HCL 25 MG PO TABS: 25.0000 mg | ORAL_TABLET | Freq: Every day | ORAL | Status: DC

## 2012-08-16 MED ORDER — TRIAMCINOLONE ACETONIDE 0.1 % EX CREA: TOPICAL_CREAM | Freq: Two times a day (BID) | CUTANEOUS | Status: DC

## 2012-08-16 MED ORDER — PREDNISONE 20 MG PO TABS: ORAL_TABLET | ORAL | Status: DC

## 2012-08-16 NOTE — Patient Instructions (Addendum)
Stop cyclobenzaprine   Recheck in 1 month

## 2012-08-16 NOTE — Progress Notes (Signed)
[facility] [address] [place] [address] Phone: [phone] Fax: [phone]  Date:  08/16/2012   Name:  Krystal Walker   DOB:  Dec 13, 1955   MRN:  [id number] Gender: female Age: 57 y.o.  Primary Physician:  Ruthe Mannan, MD  Evaluating MD: Hannah Beat, MD   Chief Complaint: Follow-up and Back Pain   History of Present Illness:  Krystal Walker is a 57 y.o. pleasant patient who presents with the following:  Patient with a history of adenoid cystic carcinoma, multiple other medical problems detailed below include diabetes, who presents with a followup on back pain and, when I initially saw her in November, 2013. At that point she was having mostly coccyx and sacral pain, and some low back pain, and we suggested some home rehabilitation for her. She has not been doing well over the last few months, and she complains at this point mostly of a burning tingling pain in her posterior thorax.  Tried [facility] - forever fit, could not continue. Now has a new chair at work. Has a lumbar chair on order, and having some pain mostly in the thoracic spine and pain in the rib area. Has taken some tramadol.   She works for [employer], and currently is in a new division.   She denies bowel or bladder incontinence. She does have chronic problems with her bowels related to IBS.  She also brings in multiple other complaints including regarding her irritable bowel, Raynaud's phenomenon, and the current rash.  Rash: TAC    Patient Active Problem List  Diagnosis  . Hypertension  . Hyperlipidemia  . Depression  . GERD (gastroesophageal reflux disease)  . Routine general medical examination at a health care facility  . Raynaud phenomenon  . IBS (irritable bowel syndrome)    Past Medical History  Diagnosis Date  . Hypertension   . Hyperlipidemia   . Diabetes mellitus   . Depression   . IBS (irritable bowel syndrome)   . Osteoporosis   . Raynaud disease   .  GERD (gastroesophageal reflux disease)   . 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 Raynaud phenomenon 1982  . Personal history of tobacco use, presenting hazards to health   . Breast screening, unspecified   . Mammographic microcalcification   . Special screening for malignant neoplasms, colon   . Bowel trouble 1997  . Radiation     for adenoid cystic carcinoma    Past Surgical History  Procedure Laterality Date  . Abdominal hysterectomy  2004    partial  . Salivary gland surgery  1999  . Colonoscopy  2010  . Parotidectomy Right     adenoid cystic carcinoma  . Breast biopsy Left 02-23-12    left breast stereotactic biopsy; benign    History   Social History  . Marital Status: Single    Spouse Name: N/A    Number of Children: N/A  . Years of Education: N/A   Occupational History  . Not on file.   Social History Main Topics  . Smoking status: Former Smoker -- 0.50 packs/day for 15 years    Types: Cigarettes  . Smokeless tobacco: Not on file  . Alcohol Use: No  . Drug Use: No  . Sexually Active: Not on file   Other Topics Concern  . Not on file   Social History Narrative  . No narrative on file    No family history on file.  Allergies  Allergen Reactions  . Latex   . Morphine And Related Nausea And Vomiting    Medication list has been reviewed and updated.  Outpatient Prescriptions Prior to Visit  Medication Sig Dispense Refill  . CALCIUM-VITAMIN D PO Take one by mouth daily      . cyclobenzaprine (FLEXERIL) 10 MG tablet Take 1 tablet (10 mg total) by mouth at bedtime.  30 tablet  1  . fish oil-omega-3 fatty acids 1000 MG capsule Take one by mouth twice daily      . GLUCOSAMINE SULFATE PO Take 2000 mg's by mouth daily      . hydrochlorothiazide (MICROZIDE) 12.5 MG capsule Take 1 capsule by mouth  daily  90 capsule  1  . Lactase (LACTOSE INTOLERANCE PO) Take by mouth.      . meloxicam (MOBIC) 15 MG tablet Take 1 tablet (15 mg total)  by mouth daily.  90 tablet  2  . Multiple Vitamin (MULTIVITAMIN) tablet Take 1 tablet by mouth daily.      Marland Kitchen omeprazole (PRILOSEC) 40 MG capsule Take 1 capsule by mouth  every day  90 capsule  1  . pravastatin (PRAVACHOL) 10 MG tablet Take 1 tablet (10 mg total) by mouth daily.  30 tablet  1  . Probiotic Product (ALIGN PO) Take by mouth.      . topiramate (TOPAMAX) 25 MG tablet Take 1 tablet (25 mg total) by mouth daily.  90 tablet  3  . traMADol (ULTRAM) 50 MG tablet Take 1 tablet (50 mg total) by mouth every 6 (six) hours as needed for pain.  50 tablet  2  . Aspirin-Caffeine (BAYER BACK & BODY PAIN EX ST PO) Take 1000 mg's five days a week.       No facility-administered medications prior to visit.    Review of Systems:   GEN: No fevers, chills. Nontoxic. Primarily MSK c/o today. MSK: Detailed in the HPI GI: tolerating PO intake without difficulty Neuro: detailed above Otherwise the pertinent positives of the ROS are noted above.    Physical Examination: BP 120/64  Pulse 108  Temp(Src) 98.6 F (37 C) (Oral)  Ht 5\' 4"  (1.626 m)  Wt 127 lb (57.607 kg)  BMI 21.79 kg/m2  SpO2 97%  Ideal Body Weight: Weight in (lb) to have BMI = 25: 145.3  Gen: Well-developed,well-nourished,in no acute distress; alert,appropriate and cooperative throughout examination HEENT: Normocephalic and atraumatic without obvious abnormalities.  Ears, externally no deformities Pulm: Breathing comfortably in no respiratory distress Range of motion at  the waist: Flexion, rotation and lateral bending: full  No echymosis or edema Rises to examination table with no difficulty Gait: non antalgic  Inspection/Deformity: No abnormality Paraspinus T:  Minimally tender  Mildly tender the thoracic region from T4-T12, and there is also some tenderness in the adjacent trapezius.  B Ankle Dorsiflexion (L5,4): 5/5 B Great Toe Dorsiflexion (L5,4): 5/5 Heel Walk (L5): WNL Toe Walk (S1): WNL Rise/Squat (L4):  WNL  SENSORY B Medial Foot (L4): WNL B Dorsum (L5): WNL B Lateral (S1): WNL Light Touch: WNL  REFLEXES Knee (L4): 2+ Ankle (S1): 2+  B SLR, seated: neg B SLR, supine: neg B FABER: neg B Reverse FABER: neg B Greater Troch: NT B Log Roll: neg B Stork: NT B Sciatic Notch: NT  Objective  Dg Thoracic Spine W/swimmers  08/16/2012  *RADIOLOGY REPORT*  Clinical Data: Back pain.  THORACIC SPINE - 2 VIEW + SWIMMERS  Comparison: None.  Findings: Slight rightward scoliosis in  the upper thoracic spine and leftward scoliosis in the lower thoracic spine.  Minimal degenerative spurring anteriorly.  No fracture or subluxation.  IMPRESSION: Slight thoracic scoliosis.  Early degenerative changes.  No acute findings.   Original Report Authenticated By: Charlett Nose, M.D.    Dg Lumbar Spine Complete  08/16/2012  *RADIOLOGY REPORT*  Clinical Data: Back pain  LUMBAR SPINE - COMPLETE 4+ VIEW  Comparison: None.  Findings: Tiny anterior endplate spurs T12-L4.  Mild bilateral facet degenerative hypertrophy L4-5 and L5-S1.  There is no evidence of lumbar spine fracture.  Alignment is normal. Intervertebral disc spaces are maintained.  IMPRESSION: 1.  Negative for fracture or other acute abnormality. 2.  Mild degenerative changes as above.   Original Report Authenticated By: D. Andria Rhein, MD      Assessment and Plan:  Back pain - Plan: DG Lumbar Spine Complete, DG Thoracic Spine W/Swimmers  Thoracic back pain  Suspect most likely thoracic nerve impingement with neuropathic sensation. I may give her a pulse and tapered dose of prednisone, and place her on Elavil 25 mg at nighttime. We can titrate up the Elavil for effect.  Also gave her some triamcinolone cream for what appears to be an atopic dermatitis to me.  F/u 1 month  Orders Today:  Orders Placed This Encounter  Procedures  . DG Lumbar Spine Complete    Standing Status: Future     Number of Occurrences: 1     Standing Expiration Date:  10/16/2013    Order Specific Question:  Preferred imaging location?    Answer:  [facility]    Order Specific Question:  Reason for exam:    Answer:  > 3 months of back pain  . DG Thoracic Spine W/Swimmers    Standing Status: Future     Number of Occurrences: 1     Standing Expiration Date: 10/16/2013    Order Specific Question:  Reason for exam:    Answer:  back pain > 3 months    Order Specific Question:  Is the patient pregnant?    Answer:  No    Order Specific Question:  Preferred imaging location?    Answer:  [facility]    Updated Medication List: (Includes new medications, updates to list, dose adjustments) Meds ordered this encounter  Medications  . triamcinolone cream (KENALOG) 0.1 %    Sig: Apply topically 2 (two) times daily.    Dispense:  45 g    Refill:  2  . amitriptyline (ELAVIL) 25 MG tablet    Sig: Take 1 tablet (25 mg total) by mouth at bedtime.    Dispense:  30 tablet    Refill:  4  . predniSONE (DELTASONE) 20 MG tablet    Sig: 2 tabs po daily for 4 days, then 1 tab po for 3 days    Dispense:  11 tablet    Refill:  0    Medications Discontinued: Medications Discontinued During This Encounter  Medication Reason  . Aspirin-Caffeine (BAYER BACK & BODY PAIN EX ST PO) Error  . cyclobenzaprine (FLEXERIL) 10 MG tablet       Signed, Spencer T. Copland, MD 08/16/2012 10:30 AM

## 2012-08-23 ENCOUNTER — Ambulatory Visit: Payer: Self-pay | Admitting: General Surgery

## 2012-08-30 ENCOUNTER — Encounter: Payer: Self-pay | Admitting: Adult Health

## 2012-08-30 ENCOUNTER — Ambulatory Visit (INDEPENDENT_AMBULATORY_CARE_PROVIDER_SITE_OTHER): Payer: 59 | Admitting: Adult Health

## 2012-08-30 VITALS — BP 129/92 | HR 106 | Temp 98.0°F | Resp 14 | Ht 65.0 in | Wt 133.0 lb

## 2012-08-30 DIAGNOSIS — R42 Dizziness and giddiness: Secondary | ICD-10-CM

## 2012-08-30 NOTE — Patient Instructions (Addendum)
   I am referring you for MRI of the brain.  Our office will contact you with an appointment.

## 2012-08-30 NOTE — Assessment & Plan Note (Signed)
Suspect this may be BPPV; however, uncertain why she is experiencing the numbness in her lips and right arm. EKG performed and was normal. Physical exam was normal. Unable to reproduce symptom of vertigo with Dix-Hallpike maneuver. I am sending her for MRI of brain without contrast. Given patient's strong family hx of cardiac/stroke and risk factors of her own, I believe this to be prudent.

## 2012-08-30 NOTE — Progress Notes (Signed)
Subjective:    Patient ID: Krystal Walker, female    DOB: 21-Sep-1955, 57 y.o.   MRN: [id number]  HPI  Patient is a pleasant 57 year old patient with history of hypertension, hyperlipidemia, adenocarcinoma of the right salivary gland who presents to clinic with c/o waking in the middle of night thinking she was having a dream that she was dizzy. She soon realized that she was actually standing and felt the room was spinning. She went back to bed and when she woke up she still felt the room was spinning. It was aggravated by sudden movements. Her right arm was numb as well as her lips. She saw floaters. Her heart was pounding and she felt her chest tight. She reports "I just don't feel right". Recently had a sinus HA. Saturday, she took sudafed x 2 and 3 nuprin for sinus HA. Pt denies syncope, chest pain or shortness of breath. She denies any facial drooping, slurred speech.    Current Outpatient Prescriptions on File Prior to Visit  Medication Sig Dispense Refill  . amitriptyline (ELAVIL) 25 MG tablet Take 1 tablet (25 mg total) by mouth at bedtime.  30 tablet  4  . CALCIUM-VITAMIN D PO Take one by mouth daily      . fish oil-omega-3 fatty acids 1000 MG capsule Take one by mouth twice daily      . GLUCOSAMINE SULFATE PO Take 2000 mg's by mouth daily      . hydrochlorothiazide (MICROZIDE) 12.5 MG capsule Take 1 capsule by mouth  daily  90 capsule  1  . Lactase (LACTOSE INTOLERANCE PO) Take by mouth.      . meloxicam (MOBIC) 15 MG tablet Take 1 tablet (15 mg total) by mouth daily.  90 tablet  2  . Multiple Vitamin (MULTIVITAMIN) tablet Take 1 tablet by mouth daily.      Marland Kitchen omeprazole (PRILOSEC) 40 MG capsule Take 1 capsule by mouth  every day  90 capsule  1  . pravastatin (PRAVACHOL) 10 MG tablet Take 1 tablet (10 mg total) by mouth daily.  30 tablet  1  . Probiotic Product (ALIGN PO) Take by mouth.      . topiramate (TOPAMAX) 25 MG tablet Take 1 tablet (25 mg total) by mouth daily.  90 tablet  3   . triamcinolone cream (KENALOG) 0.1 % Apply topically 2 (two) times daily.  45 g  2  . predniSONE (DELTASONE) 20 MG tablet 2 tabs po daily for 4 days, then 1 tab po for 3 days  11 tablet  0  . traMADol (ULTRAM) 50 MG tablet Take 1 tablet (50 mg total) by mouth every 6 (six) hours as needed for pain.  50 tablet  2   No current facility-administered medications on file prior to visit.     Review of Systems  Constitutional: Negative for fever and chills.  HENT: Positive for congestion and sinus pressure.   Eyes: Positive for visual disturbance.       Floaters  Respiratory: Positive for chest tightness. Negative for cough, shortness of breath and wheezing.   Cardiovascular: Negative for chest pain and leg swelling.       Heart pounding  Gastrointestinal: Negative for nausea.  Neurological: Positive for dizziness and headaches. Negative for syncope, weakness and light-headedness.    BP 129/92  Pulse 106  Temp(Src) 98 F (36.7 C)  Resp 14  Ht 5\' 5"  (1.651 m)  Wt 133 lb (60.328 kg)  BMI 22.13 kg/m2  SpO2 100%  Objective:   Physical Exam  Constitutional: She is oriented to person, place, and time. She appears well-developed and well-nourished. No distress.  HENT:  Head: Normocephalic and atraumatic.  Right Ear: External ear normal.  Left Ear: External ear normal.  Drainage posterior pharynx  Neck: Normal range of motion.  Cardiovascular: Regular rhythm, S1 normal, S2 normal, normal heart sounds and intact distal pulses.  Tachycardia present.   No murmur heard. Pulmonary/Chest: Effort normal and breath sounds normal. No respiratory distress. She has no wheezes. She has no rales. She exhibits no tenderness.  Musculoskeletal:  Excellent muscle strength bilateral upper/lower extremities.  Lymphadenopathy:    She has no cervical adenopathy.  Neurological: She is alert and oriented to person, place, and time. No cranial nerve deficit. Coordination normal.  Intact to light touch,  pain, position sense bilateral arms, hands. No nystagmus noted. Unable to reproduce symptoms of vertigo with Dix-Hallpike test.  Skin: Skin is warm and dry.  Psychiatric: She has a normal mood and affect. Her behavior is normal. Judgment and thought content normal.          Assessment & Plan:

## 2012-09-06 ENCOUNTER — Ambulatory Visit (INDEPENDENT_AMBULATORY_CARE_PROVIDER_SITE_OTHER): Payer: 59 | Admitting: General Surgery

## 2012-09-06 ENCOUNTER — Encounter: Payer: Self-pay | Admitting: General Surgery

## 2012-09-06 VITALS — BP 130/80 | HR 72 | Resp 14 | Ht 65.0 in | Wt 133.0 lb

## 2012-09-06 DIAGNOSIS — N6019 Diffuse cystic mastopathy of unspecified breast: Secondary | ICD-10-CM

## 2012-09-06 DIAGNOSIS — N6012 Diffuse cystic mastopathy of left breast: Secondary | ICD-10-CM

## 2012-09-06 DIAGNOSIS — Z1231 Encounter for screening mammogram for malignant neoplasm of breast: Secondary | ICD-10-CM

## 2012-09-06 DIAGNOSIS — R920 Mammographic microcalcification found on diagnostic imaging of breast: Secondary | ICD-10-CM

## 2012-09-06 NOTE — Patient Instructions (Addendum)
Patient advised to return in 5 months with bilateral screening mammogram.

## 2012-09-06 NOTE — Progress Notes (Signed)
Patient ID: Krystal Walker, female   DOB: Sep 03, 1955, 57 y.o.   MRN: [id number]  Chief Complaint  Patient presents with  . Follow-up    mammogram    HPI Krystal Walker is a 57 y.o. female that presents for a follow up mammogram.  Most recent mammogram was done at [facility] with a birad category 3. No new breast problems. No known family history of breast cancer. The only thing new is the patient had an episode of vertigo 1 week ago and right arm numbness. Patient has been seen by [facility] and is scheduled to have an MRI tomorrow.  HPI  Past Medical History  Diagnosis Date  . Hypertension   . Hyperlipidemia   . Diabetes mellitus   . Depression   . IBS (irritable bowel syndrome)   . Osteoporosis   . Raynaud disease   . GERD (gastroesophageal reflux disease)   . 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 Raynaud phenomenon 1982  . Personal history of tobacco use, presenting hazards to health   . Breast screening, unspecified   . Mammographic microcalcification   . Special screening for malignant neoplasms, colon   . Bowel trouble 1997  . Radiation     for adenoid cystic carcinoma    Past Surgical History  Procedure Laterality Date  . Abdominal hysterectomy  2004    partial  . Salivary gland surgery  1999  . Colonoscopy  2010  . Parotidectomy Right     adenoid cystic carcinoma  . Breast biopsy Left 02-23-12    left breast stereotactic biopsy; benign    History reviewed. No pertinent family history.  Social History History  Substance Use Topics  . Smoking status: Former Smoker -- 0.50 packs/day for 15 years    Types: Cigarettes  . Smokeless tobacco: Not on file  . Alcohol Use: No    Allergies  Allergen Reactions  . Latex   . Morphine And Related Nausea And Vomiting    Current Outpatient Prescriptions  Medication Sig Dispense Refill  . amitriptyline (ELAVIL) 25 MG tablet Take 1 tablet (25 mg total) by mouth at bedtime.  30 tablet  4  .  CALCIUM-VITAMIN D PO Take one by mouth daily      . fish oil-omega-3 fatty acids 1000 MG capsule Take one by mouth twice daily      . GLUCOSAMINE SULFATE PO Take 2000 mg's by mouth daily      . hydrochlorothiazide (MICROZIDE) 12.5 MG capsule Take 1 capsule by mouth  daily  90 capsule  1  . Lactase (LACTOSE INTOLERANCE PO) Take by mouth.      . meloxicam (MOBIC) 15 MG tablet Take 1 tablet (15 mg total) by mouth daily.  90 tablet  2  . Multiple Vitamin (MULTIVITAMIN) tablet Take 1 tablet by mouth daily.      Marland Kitchen omeprazole (PRILOSEC) 40 MG capsule Take 1 capsule by mouth  every day  90 capsule  1  . pravastatin (PRAVACHOL) 10 MG tablet Take 1 tablet (10 mg total) by mouth daily.  30 tablet  1  . Probiotic Product (ALIGN PO) Take by mouth.      . topiramate (TOPAMAX) 25 MG tablet Take 1 tablet (25 mg total) by mouth daily.  90 tablet  3   No current facility-administered medications for this visit.    Review of Systems Review of Systems  Constitutional: Negative.   Respiratory: Negative.   Cardiovascular: Negative.  Blood pressure 130/80, pulse 72, resp. rate 14, height 5\' 5"  (1.651 m), weight 133 lb (60.328 kg).  Physical Exam Physical Exam  Constitutional: She appears well-developed and well-nourished.  Neck: Trachea normal. Neck supple. No mass and no thyromegaly present.  Cardiovascular: Normal rate, regular rhythm, normal heart sounds and normal pulses.   Pulmonary/Chest: Effort normal and breath sounds normal. Right breast exhibits no inverted nipple, no mass, no nipple discharge, no skin change and no tenderness. Left breast exhibits no inverted nipple, no mass, no nipple discharge, no skin change and no tenderness. Breasts are symmetrical.  Abdominal: Soft. Normal appearance. There is no hepatosplenomegaly. There is no tenderness. No hernia.  Lymphadenopathy:    She has no cervical adenopathy.    She has no axillary adenopathy.    Data Reviewed Mammogram left   reviewed-BIRADS 2. Prior calcifications are not seen, clip in place  Assessment    Stable exam.     Plan    F/u in 1 yr        Darnelle Catalan 09/06/2012, 1:39 PM

## 2012-09-07 ENCOUNTER — Encounter: Payer: Self-pay | Admitting: General Surgery

## 2012-09-07 DIAGNOSIS — N6019 Diffuse cystic mastopathy of unspecified breast: Secondary | ICD-10-CM | POA: Insufficient documentation

## 2012-09-07 DIAGNOSIS — R920 Mammographic microcalcification found on diagnostic imaging of breast: Secondary | ICD-10-CM | POA: Insufficient documentation

## 2012-09-08 ENCOUNTER — Ambulatory Visit: Payer: Self-pay | Admitting: Internal Medicine

## 2012-09-09 ENCOUNTER — Telehealth: Payer: Self-pay | Admitting: Internal Medicine

## 2012-09-13 ENCOUNTER — Telehealth: Payer: Self-pay | Admitting: Adult Health

## 2012-09-13 NOTE — Telephone Encounter (Signed)
Please call patient and find out if she is feeling any better with the numbness in right arm and lips.

## 2012-09-13 NOTE — Telephone Encounter (Signed)
Called patient and msg left

## 2012-09-14 ENCOUNTER — Other Ambulatory Visit: Payer: Self-pay | Admitting: *Deleted

## 2012-09-14 MED ORDER — AMITRIPTYLINE HCL 25 MG PO TABS: 25.0000 mg | ORAL_TABLET | Freq: Every day | ORAL | Status: DC

## 2012-09-15 NOTE — Telephone Encounter (Signed)
Left a message for pt to inform us if she is feeling any better.

## 2012-09-17 ENCOUNTER — Telehealth: Payer: Self-pay | Admitting: *Deleted

## 2012-09-17 NOTE — Telephone Encounter (Signed)
Spoke with patient she stated she is still having a headache, but thinks it is related to her sinus. No longer having any of the numbness and tingling. Would not like to be referred to Neuro right now, just going to give it time and get a massage. If she thinks it is getting worse she will call office back to let us know. Informed patient of her MRI results per Raquel, MRI showed no acute abnormalities. Etiology such as migraine headaches or vasculitis cannot be excluded.

## 2012-09-17 NOTE — Telephone Encounter (Signed)
Called patient to check on her to see how she is feeling, if she is still having symptoms. If so per Raquel, she would like to refer her to neuro.

## 2012-09-20 ENCOUNTER — Encounter: Payer: Self-pay | Admitting: Family Medicine

## 2012-09-20 ENCOUNTER — Ambulatory Visit (INDEPENDENT_AMBULATORY_CARE_PROVIDER_SITE_OTHER): Payer: 59 | Admitting: Family Medicine

## 2012-09-20 ENCOUNTER — Ambulatory Visit: Payer: 59 | Admitting: Family Medicine

## 2012-09-20 VITALS — BP 118/78 | HR 88 | Temp 98.1°F | Wt 132.2 lb

## 2012-09-20 DIAGNOSIS — M546 Pain in thoracic spine: Secondary | ICD-10-CM

## 2012-09-20 NOTE — Progress Notes (Signed)
[facility] at [facility] [address] [place] [address] Phone: [phone] Fax: [phone]  Date:  09/20/2012   Name:  Krystal Walker   DOB:  [birth year]   MRN:  [id number] Gender: female Age: 57 y.o.  Primary Physician:  Ruthe Mannan, MD  Evaluating MD: Hannah Beat, MD   Chief Complaint: Follow-up   History of Present Illness:  Krystal Walker is a 57 y.o. pleasant patient who presents with the following:  F/u on back. Relates history of having some numbness in UE - see [facility] notes - had an MRI at [facility], normal.   Now changed back and work position. Moving through all different offices throughout the day. Nowhere hurting. Back is feeling a lot better. Did a course of steroids and We started her on Elavil 25 mg at night.  08/16/2012 OV Patient with a history of adenoid cystic carcinoma, multiple other medical problems detailed below include diabetes, who presents with a followup on back pain and, when I initially saw her in November, 2013. At that point she was having mostly coccyx and sacral pain, and some low back pain, and we suggested some home rehabilitation for her. She has not been doing well over the last few months, and she complains at this point mostly of a burning tingling pain in her posterior thorax.  Tried [facility] - forever fit, could not continue. Now has a new chair at work. Has a lumbar chair on order, and having some pain mostly in the thoracic spine and pain in the rib area. Has taken some tramadol.   She works for [employer], and currently is in a new division.   She denies bowel or bladder incontinence. She does have chronic problems with her bowels related to IBS.  She also brings in multiple other complaints including regarding her irritable bowel, Raynaud's phenomenon, and the current rash.   Patient Active Problem List  Diagnosis  . Hypertension  . Hyperlipidemia  . Depression  . GERD (gastroesophageal reflux disease)  .  Routine general medical examination at a health care facility  . Raynaud phenomenon  . IBS (irritable bowel syndrome)  . Dizziness  . Abnormal mammogram with microcalcification  . Fibrocystic disease of breast    Past Medical History  Diagnosis Date  . Hypertension   . Hyperlipidemia   . Diabetes mellitus   . Depression   . IBS (irritable bowel syndrome)   . Osteoporosis   . Raynaud disease   . GERD (gastroesophageal reflux disease)   . 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 Raynaud phenomenon 1982  . Personal history of tobacco use, presenting hazards to health   . Breast screening, unspecified   . Mammographic microcalcification   . Special screening for malignant neoplasms, colon   . Bowel trouble 1997  . Radiation     for adenoid cystic carcinoma    Past Surgical History  Procedure Laterality Date  . Abdominal hysterectomy  2004    partial  . Salivary gland surgery  1999  . Colonoscopy  2010  . Parotidectomy Right     adenoid cystic carcinoma  . Breast biopsy Left 02-23-12    left breast stereotactic biopsy; benign    History   Social History  . Marital Status: Single    Spouse Name: N/A    Number of Children: N/A  . Years of Education: N/A   Occupational History  . Not on file.   Social History Main  Topics  . Smoking status: Former Smoker -- 0.50 packs/day for 15 years    Types: Cigarettes  . Smokeless tobacco: Never Used     Comment: quit 1986  . Alcohol Use: No  . Drug Use: No  . Sexually Active: Not on file   Other Topics Concern  . Not on file   Social History Narrative  . No narrative on file    No family history on file.  Allergies  Allergen Reactions  . Latex   . Morphine And Related Nausea And Vomiting    Medication list has been reviewed and updated.  Outpatient Prescriptions Prior to Visit  Medication Sig Dispense Refill  . amitriptyline (ELAVIL) 25 MG tablet Take 1 tablet (25 mg total) by mouth at  bedtime.  90 tablet  0  . CALCIUM-VITAMIN D PO 2 (two) times daily. Take one by mouth daily      . fish oil-omega-3 fatty acids 1000 MG capsule Take one by mouth twice daily      . GLUCOSAMINE SULFATE PO Take 2000 mg's by mouth daily      . hydrochlorothiazide (MICROZIDE) 12.5 MG capsule Take 1 capsule by mouth  daily  90 capsule  1  . Lactase (LACTOSE INTOLERANCE PO) Take by mouth daily.       . meloxicam (MOBIC) 15 MG tablet Take 1 tablet (15 mg total) by mouth daily.  90 tablet  2  . Multiple Vitamin (MULTIVITAMIN) tablet Take 1 tablet by mouth daily.      Marland Kitchen omeprazole (PRILOSEC) 40 MG capsule Take 1 capsule by mouth  every day  90 capsule  1  . pravastatin (PRAVACHOL) 10 MG tablet Take 1 tablet (10 mg total) by mouth daily.  30 tablet  1  . Probiotic Product (ALIGN PO) Take by mouth daily.       Marland Kitchen topiramate (TOPAMAX) 25 MG tablet Take 1 tablet (25 mg total) by mouth daily.  90 tablet  3   No facility-administered medications prior to visit.    Review of Systems:   GEN: No fevers, chills. Nontoxic. Primarily MSK c/o today. MSK: Detailed in the HPI GI: tolerating PO intake without difficulty Neuro: detailed above Otherwise the pertinent positives of the ROS are noted above.    Physical Examination: BP 118/78  Pulse 88  Temp(Src) 98.1 F (36.7 C) (Oral)  Wt 132 lb 4 oz (59.988 kg)  BMI 22.01 kg/m2  Ideal Body Weight:     GEN: Well-developed,well-nourished,in no acute distress; alert,appropriate and cooperative throughout examination HEENT: Normocephalic and atraumatic without obvious abnormalities. Ears, externally no deformities PULM: Breathing comfortably in no respiratory distress EXT: No clubbing, cyanosis, or edema PSYCH: Normally interactive. Cooperative during the interview. Pleasant. Friendly and conversant. Not anxious or depressed appearing. Normal, full affect.  Range of motion at  the waist: Flexion: normal Extension: normal Lateral bending:  normal Rotation: all normal  No echymosis or edema Rises to examination table with no difficulty Gait: non antalgic  Inspection/Deformity: N Paraspinus Tenderness: nml  SENSORY B Medial Foot (L4): WNL B Dorsum (L5): WNL B Lateral (S1): WNL Light Touch: WNL Pinprick: WNL  REFLEXES Knee (L4): 2+ Ankle (S1): 2+  B SLR, seated: neg B Sciatic Notch: NT   Assessment and Plan:  Thoracic back pain  Doing much better. Keep being active, tramadol prn. Elavil at night seems to help  F/u with me prn  Orders Today:  No orders of the defined types were placed in this encounter.  Updated Medication List: (Includes new medications, updates to list, dose adjustments) Meds ordered this encounter  Medications  . traMADol (ULTRAM) 50 MG tablet    Sig: Take 50 mg by mouth every 6 (six) hours as needed for pain.  . pseudoephedrine (SUDAFED) 30 MG tablet    Sig: Take 30 mg by mouth every 4 (four) hours as needed for congestion.  . diphenhydrAMINE (BENADRYL) 25 mg capsule    Sig: Take 25 mg by mouth at bedtime as needed for itching.  . loratadine (CLARITIN) 10 MG tablet    Sig: Take 10 mg by mouth daily as needed for allergies.    Medications Discontinued: There are no discontinued medications.    Signed, Elpidio Galea. Rosser Collington, MD 09/20/2012 10:43 AM

## 2012-10-14 ENCOUNTER — Encounter: Payer: Self-pay | Admitting: Adult Health

## 2012-10-25 ENCOUNTER — Other Ambulatory Visit: Payer: Self-pay | Admitting: Family Medicine

## 2013-01-10 ENCOUNTER — Other Ambulatory Visit: Payer: Self-pay

## 2013-01-10 MED ORDER — TOPIRAMATE 25 MG PO TABS: 25.0000 mg | ORAL_TABLET | Freq: Every day | ORAL | Status: DC

## 2013-01-10 MED ORDER — AMITRIPTYLINE HCL 25 MG PO TABS: 25.0000 mg | ORAL_TABLET | Freq: Every day | ORAL | Status: DC

## 2013-01-10 NOTE — Telephone Encounter (Signed)
Pt left note requesting refill amitriptyline and topiramate to [facility]. Pt scheduled CPX 04/04/13 and request written order for labs prior to appt mailed to verified home address. (pt is [employer]).Please advise.

## 2013-02-08 ENCOUNTER — Ambulatory Visit: Payer: Self-pay | Admitting: General Surgery

## 2013-02-08 ENCOUNTER — Encounter: Payer: Self-pay | Admitting: General Surgery

## 2013-02-21 ENCOUNTER — Ambulatory Visit: Payer: 59 | Admitting: General Surgery

## 2013-03-01 ENCOUNTER — Ambulatory Visit: Payer: 59 | Admitting: General Surgery

## 2013-03-07 ENCOUNTER — Other Ambulatory Visit: Payer: Self-pay | Admitting: Family Medicine

## 2013-03-07 NOTE — Telephone Encounter (Signed)
Received refill request electronically. Patient has an appointment scheduled for 04/04/13. Is it okay to refill medication?

## 2013-03-28 ENCOUNTER — Encounter: Payer: Self-pay | Admitting: Family Medicine

## 2013-03-29 ENCOUNTER — Encounter: Payer: Self-pay | Admitting: Family Medicine

## 2013-03-30 MED ORDER — TRAMADOL HCL 50 MG PO TABS: 50.0000 mg | ORAL_TABLET | Freq: Four times a day (QID) | ORAL | Status: DC | PRN

## 2013-03-30 NOTE — Telephone Encounter (Signed)
Called to [facility] in [place].

## 2013-03-30 NOTE — Telephone Encounter (Signed)
Ok to refill 40, 4 refills

## 2013-04-04 ENCOUNTER — Encounter: Payer: 59 | Admitting: Family Medicine

## 2013-04-14 ENCOUNTER — Other Ambulatory Visit: Payer: Self-pay

## 2013-05-05 ENCOUNTER — Other Ambulatory Visit: Payer: Self-pay | Admitting: Family Medicine

## 2013-05-25 ENCOUNTER — Telehealth: Payer: Self-pay | Admitting: Family Medicine

## 2013-05-25 NOTE — Telephone Encounter (Signed)
Patient had called about heart palpitations, asked for call back Attempted calling patient x4 but no answer - left message for patient to call us back.

## 2013-05-25 NOTE — Telephone Encounter (Signed)
Noted- maybe pt will call back

## 2013-05-30 ENCOUNTER — Ambulatory Visit (INDEPENDENT_AMBULATORY_CARE_PROVIDER_SITE_OTHER): Payer: 59 | Admitting: Internal Medicine

## 2013-05-30 ENCOUNTER — Encounter: Payer: Self-pay | Admitting: Internal Medicine

## 2013-05-30 VITALS — BP 108/72 | HR 98 | Temp 97.8°F | Wt 138.0 lb

## 2013-05-30 DIAGNOSIS — R42 Dizziness and giddiness: Secondary | ICD-10-CM

## 2013-05-30 DIAGNOSIS — R002 Palpitations: Secondary | ICD-10-CM

## 2013-05-30 DIAGNOSIS — I10 Essential (primary) hypertension: Secondary | ICD-10-CM

## 2013-05-30 NOTE — Patient Instructions (Signed)

## 2013-05-30 NOTE — Progress Notes (Signed)
Subjective:    Patient ID: Krystal Walker, female    DOB: 1956-02-16, 57 y.o.   MRN: [id number]  HPI  Pt presents to the clinic today with c/o low blood pressure and palpitations. This occurred 1 week ago. She does report on the day that it occurred, she had donated blood. She did have some snacks right after donating blood. After, she went shopping and does not recall eating or drinking anything for about 6 hours. When she got home, she started making fruit dip. That's when she felt dizzy. She tried to sit down but the feeling didn't go away. She went into the other room to take her blood pressure. She reports it was 63/40. At that time she laid down in the floor, called her sister. Her sister called 911. When EMS arrived, her blood pressure was 108/something. They advised her to follow up with her PCP. She pushed fluids over the next few days and did not have any further episodes of dizziness. Additionally, she c/o feeling like her heart is pounding out of her chest. This occurs at night. She does not feel like it is beating in a abnormal rhythm, she just feels like it is beating hard, so hard that it shakes her whole body and the bed. She denies chest pain, chest tightness or shortness of breath. She would like to wear a heart monitor to evaluate if her heart has something wrong with it. She did have a normal ECG 05/2013. She would also like to discuss ringing in her ears for the last 2 months. I told her we would cover the first 2 issues, and she would need to make a follow up appointment to discuss the rest of her concerns.   Review of Systems      Past Medical History  Diagnosis Date  . Hypertension   . Hyperlipidemia   . Diabetes mellitus   . Depression   . IBS (irritable bowel syndrome)   . Osteoporosis   . Raynaud disease   . GERD (gastroesophageal reflux disease)   . 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 Raynaud phenomenon 1982  . Personal history of  tobacco use, presenting hazards to health   . Breast screening, unspecified   . Mammographic microcalcification   . Special screening for malignant neoplasms, colon   . Bowel trouble 1997  . Radiation     for adenoid cystic carcinoma    Current Outpatient Prescriptions  Medication Sig Dispense Refill  . amitriptyline (ELAVIL) 25 MG tablet Take 1 tablet (25 mg total) by mouth at bedtime.  90 tablet  0  . CALCIUM-VITAMIN D PO 2 (two) times daily. Take one by mouth daily      . diphenhydrAMINE (BENADRYL) 25 mg capsule Take 25 mg by mouth at bedtime as needed for itching.      . fish oil-omega-3 fatty acids 1000 MG capsule Take one by mouth twice daily      . GLUCOSAMINE SULFATE PO Take 2000 mg's by mouth daily      . hydrochlorothiazide (MICROZIDE) 12.5 MG capsule Take 1 capsule by mouth  daily  90 capsule  0  . Lactase (LACTOSE INTOLERANCE PO) Take by mouth daily.       Marland Kitchen loratadine (CLARITIN) 10 MG tablet Take 10 mg by mouth daily as needed for allergies.      . meloxicam (MOBIC) 15 MG tablet Take 1 tablet by mouth  daily  90 tablet  3  .  Multiple Vitamin (MULTIVITAMIN) tablet Take 1 tablet by mouth daily.      Marland Kitchen omeprazole (PRILOSEC) 40 MG capsule Take 1 capsule by mouth  every day  90 capsule  0  . pravastatin (PRAVACHOL) 10 MG tablet Take 1 tablet by mouth  daily  90 tablet  0  . Probiotic Product (ALIGN PO) Take by mouth daily.       . pseudoephedrine (SUDAFED) 30 MG tablet Take 30 mg by mouth every 4 (four) hours as needed for congestion.      . topiramate (TOPAMAX) 25 MG tablet Take 1 tablet by mouth  daily.  90 tablet  0  . traMADol (ULTRAM) 50 MG tablet Take 1 tablet (50 mg total) by mouth every 6 (six) hours as needed for pain.  40 tablet  4   No current facility-administered medications for this visit.    Allergies  Allergen Reactions  . Latex   . Morphine And Related Nausea And Vomiting    No family history on file.  History   Social History  . Marital Status:  Single    Spouse Name: N/A    Number of Children: N/A  . Years of Education: N/A   Occupational History  . Not on file.   Social History Main Topics  . Smoking status: Former Smoker -- 0.50 packs/day for 15 years    Types: Cigarettes  . Smokeless tobacco: Never Used     Comment: quit 1986  . Alcohol Use: No  . Drug Use: No  . Sexual Activity: Not on file   Other Topics Concern  . Not on file   Social History Narrative  . No narrative on file     Constitutional: Denies fever, malaise, fatigue, headache or abrupt weight changes.  Respiratory: Denies difficulty breathing, shortness of breath, cough or sputum production.   Cardiovascular: Denies chest pain, chest tightness, or swelling in the hands or feet.   Neurological: Denies dizziness, difficulty with memory, difficulty with speech or problems with balance and coordination.   No other specific complaints in a complete review of systems (except as listed in HPI above).  Objective:   Physical Exam  BP 108/72  Pulse 98  Temp(Src) 97.8 F (36.6 C) (Oral)  Wt 138 lb (62.596 kg)  SpO2 98% Wt Readings from Last 3 Encounters:  05/30/13 138 lb (62.596 kg)  09/20/12 132 lb 4 oz (59.988 kg)  09/06/12 133 lb (60.328 kg)    General: Appears her  stated age, well developed, well nourished in NAD. Cardiovascular: Normal rate and rhythm. S1,S2 noted.  No murmur, rubs or gallops noted. No JVD or BLE edema. No carotid bruits noted. Pulmonary/Chest: Normal effort and positive vesicular breath sounds. No respiratory distress. No wheezes, rales or ronchi noted.  Neurological: Alert and oriented. Cranial nerves II-XII intact. Coordination normal. +DTRs bilaterally.      Assessment & Plan:   Dizziness, likely related to dehydration and blood donation:  No recurrent episodes Advised pt to avoid blood donation for now and drinks lots of water to stay well hydrated I did advise her to stop her BP medication, monitor her blood  pressure and followup in 1 month to reassess Will check CBC, CMET and TSH today  Abnormal heart sensation:  ECG from 09/2012 reviewed- normal Will refer to cardiology per pt request for possible holter monitor  RTC in 1 month

## 2013-05-30 NOTE — Progress Notes (Signed)
Pre-visit discussion using our clinic review tool. No additional management support is needed unless otherwise documented below in the visit note.  

## 2013-05-30 NOTE — Assessment & Plan Note (Signed)
Low Will hold hctz and recheck bp in 1 month

## 2013-06-01 LAB — TSH: TSH: 2.8 u[IU]/mL (ref 0.450–4.500)

## 2013-06-01 LAB — COMPREHENSIVE METABOLIC PANEL
AST: 32 IU/L (ref 0–40)
Albumin: 4.3 g/dL (ref 3.5–5.5)
BUN: 20 mg/dL (ref 6–24)
Chloride: 102 mmol/L (ref 97–108)
GFR calc Af Amer: 75 mL/min/{1.73_m2} (ref >59)
Glucose: 89 mg/dL (ref 65–99)
Sodium: 142 mmol/L (ref 134–144)
Total Bilirubin: 0.5 mg/dL (ref 0.0–1.2)
Total Protein: 6.1 g/dL (ref 6.0–8.5)

## 2013-06-01 LAB — CBC
Hemoglobin: 11.4 g/dL (ref 11.1–15.9)
MCHC: 34.5 g/dL (ref 31.5–35.7)
MCV: 93 fL (ref 79–97)
RBC: 3.54 x10E6/uL — ABNORMAL LOW (ref 3.77–5.28)

## 2013-06-13 ENCOUNTER — Encounter: Payer: Self-pay | Admitting: Cardiovascular Disease

## 2013-06-13 ENCOUNTER — Ambulatory Visit (INDEPENDENT_AMBULATORY_CARE_PROVIDER_SITE_OTHER): Payer: 59 | Admitting: Cardiovascular Disease

## 2013-06-13 VITALS — BP 150/98 | HR 111 | Ht 65.0 in | Wt 139.0 lb

## 2013-06-13 DIAGNOSIS — R000 Tachycardia, unspecified: Secondary | ICD-10-CM

## 2013-06-13 DIAGNOSIS — R002 Palpitations: Secondary | ICD-10-CM | POA: Insufficient documentation

## 2013-06-13 DIAGNOSIS — I10 Essential (primary) hypertension: Secondary | ICD-10-CM

## 2013-06-13 DIAGNOSIS — R55 Syncope and collapse: Secondary | ICD-10-CM | POA: Insufficient documentation

## 2013-06-13 MED ORDER — METOPROLOL TARTRATE 25 MG PO TABS: 25.0000 mg | ORAL_TABLET | Freq: Two times a day (BID) | ORAL | Status: DC

## 2013-06-13 NOTE — Assessment & Plan Note (Signed)
Given her near syncope, episodes of palpitations at nighttime, tachycardia, we have ordered an event monitor. We'll start her on metoprolol 25 mg twice a day after she has been on the monitor for several days. She reports chronic tachycardia at home

## 2013-06-13 NOTE — Patient Instructions (Addendum)
We will order a event monitor for tachycardia, pass out spells, tachycardia  We have ordered metoprolol twice a day for fast heart rate (start after you have been wearing the monitor for a few days)  We will order an echocardiogram for syncope  Please call us if you have new issues that need to be addressed before your next appt.  Your physician wants you to follow-up in: 6 weeks

## 2013-06-13 NOTE — Progress Notes (Signed)
Patient ID: Krystal Walker, female    DOB: May 18, 1956, 58 y.o.   MRN: [id number]  HPI Comments: Krystal Walker is a very pleasant 58 year old woman with history of throat cancer/salivary gland, strong family history of stroke who presents for new patient evaluation of near syncope and palpitations at nighttime. She reports having a long history of headaches.  She reports that 2 weeks ago she donated blood in the morning. She had a good meal after donating blood. That evening she reports having acute onset of dizziness while in the kitchen. She had to lay down on the floor and felt very dizzy. She lay there until symptoms resolve and at home she reported systolic pressures as low as 60/40. EMTs were called that recorded blood pressure of 938 systolic by her report.  She reports having a similar episode several years ago while she was eating cereal in the morning. At that time she again called EMTs. She is otherwise active, does exercise Lab work shows borderline anemia She reports having very strong heartbeat at nighttime, she'll strong and fast. Does not appreciate him in the daytime HCTZ was recently held for suspected low blood pressure. She reports having a dry mouth all the time.  Stress test March 2012 showed no ischemia, normal ejection fraction. Prior EKG in March 2012 showing T-wave abnormality in leads V5, V6 EKG today showing normal sinus rhythm with rate 95 beats per minute with nonspecific T wave abnormality in leads V2 through V6, 2, 3, aVF   Outpatient Encounter Prescriptions as of 06/13/2013  Medication Sig  . amitriptyline (ELAVIL) 25 MG tablet Take 1 tablet (25 mg total) by mouth at bedtime.  Marland Kitchen CALCIUM-VITAMIN D PO 2 (two) times daily. Take one by mouth daily  . diphenhydrAMINE (BENADRYL) 25 mg capsule Take 25 mg by mouth at bedtime as needed for itching.  . fish oil-omega-3 fatty acids 1000 MG capsule Take one by mouth twice daily  . GLUCOSAMINE SULFATE PO Take 2000 mg's by  mouth daily  . Lactase (LACTOSE INTOLERANCE PO) Take by mouth daily.   Marland Kitchen loratadine (CLARITIN) 10 MG tablet Take 10 mg by mouth daily as needed for allergies.  . meloxicam (MOBIC) 15 MG tablet Take 1 tablet by mouth  daily  . Multiple Vitamin (MULTIVITAMIN) tablet Take 1 tablet by mouth daily.  Marland Kitchen omeprazole (PRILOSEC) 40 MG capsule Take 1 capsule by mouth  every day  . pravastatin (PRAVACHOL) 10 MG tablet Take 1 tablet by mouth  daily  . Probiotic Product (ALIGN PO) Take by mouth daily.   . pseudoephedrine (SUDAFED) 30 MG tablet Take 30 mg by mouth every 4 (four) hours as needed for congestion.  . topiramate (TOPAMAX) 25 MG tablet Take 1 tablet by mouth  daily.  . traMADol (ULTRAM) 50 MG tablet Take 1 tablet (50 mg total) by mouth every 6 (six) hours as needed for pain.  . metoprolol tartrate (LOPRESSOR) 25 MG tablet Take 1 tablet (25 mg total) by mouth 2 (two) times daily.    Review of Systems  Constitutional: Negative.   HENT: Negative.   Eyes: Negative.   Respiratory: Negative.   Cardiovascular: Positive for palpitations.  Gastrointestinal: Negative.   Endocrine: Negative.   Musculoskeletal: Negative.   Skin: Negative.   Allergic/Immunologic: Negative.   Neurological: Positive for light-headedness.  Hematological: Negative.   Psychiatric/Behavioral: Negative.   All other systems reviewed and are negative.    BP 150/98  Pulse 111  Ht 5\' 5"  (1.651 m)  Wt 139  lb (63.05 kg)  BMI 23.13 kg/m2  Physical Exam  Nursing note and vitals reviewed. Constitutional: She is oriented to person, place, and time. She appears well-developed and well-nourished.  HENT:  Head: Normocephalic.  Nose: Nose normal.  Mouth/Throat: Oropharynx is clear and moist.  Eyes: Conjunctivae are normal. Pupils are equal, round, and reactive to light.  Neck: Normal range of motion. Neck supple. No JVD present.  Cardiovascular: Normal rate, regular rhythm, S1 normal, S2 normal, normal heart sounds and  intact distal pulses.  Exam reveals no gallop and no friction rub.   No murmur heard. Pulmonary/Chest: Effort normal and breath sounds normal. No respiratory distress. She has no wheezes. She has no rales. She exhibits no tenderness.  Abdominal: Soft. Bowel sounds are normal. She exhibits no distension. There is no tenderness.  Musculoskeletal: Normal range of motion. She exhibits no edema and no tenderness.  Lymphadenopathy:    She has no cervical adenopathy.  Neurological: She is alert and oriented to person, place, and time. Coordination normal.  Skin: Skin is warm and dry. No rash noted. No erythema.  Psychiatric: She has a normal mood and affect. Her behavior is normal. Judgment and thought content normal.    Assessment and Plan

## 2013-06-13 NOTE — Assessment & Plan Note (Signed)
Blood pressure is elevated today. We have suggested she start metoprolol 25 mg twice a day as she has elevated heart rate today and reports having elevated heart rate at home with pounding and tachycardia at nighttime

## 2013-06-13 NOTE — Assessment & Plan Note (Signed)
Event monitor has been ordered. Sounds very similar to vasovagal episode though no precipitating events apart from donating blood that morning. Did have an event several years ago as well. Unable to exclude arrhythmia. EKG changes noted, diffuse T-wave abnormality. Echocardiogram has been ordered

## 2013-06-14 ENCOUNTER — Other Ambulatory Visit: Payer: Self-pay

## 2013-06-14 ENCOUNTER — Other Ambulatory Visit (INDEPENDENT_AMBULATORY_CARE_PROVIDER_SITE_OTHER): Payer: 59

## 2013-06-14 DIAGNOSIS — R42 Dizziness and giddiness: Secondary | ICD-10-CM

## 2013-06-14 DIAGNOSIS — R000 Tachycardia, unspecified: Secondary | ICD-10-CM

## 2013-06-14 DIAGNOSIS — R55 Syncope and collapse: Secondary | ICD-10-CM

## 2013-06-14 DIAGNOSIS — R002 Palpitations: Secondary | ICD-10-CM

## 2013-06-20 ENCOUNTER — Encounter: Payer: Self-pay | Admitting: Family Medicine

## 2013-06-20 DIAGNOSIS — R55 Syncope and collapse: Secondary | ICD-10-CM

## 2013-07-04 ENCOUNTER — Encounter: Payer: Self-pay | Admitting: Family Medicine

## 2013-07-04 ENCOUNTER — Other Ambulatory Visit: Payer: Self-pay | Admitting: Family Medicine

## 2013-07-04 ENCOUNTER — Ambulatory Visit (INDEPENDENT_AMBULATORY_CARE_PROVIDER_SITE_OTHER): Payer: 59 | Admitting: Family Medicine

## 2013-07-04 ENCOUNTER — Encounter: Payer: 59 | Admitting: Family Medicine

## 2013-07-04 VITALS — BP 128/78 | HR 93 | Temp 97.8°F | Ht 64.5 in | Wt 141.8 lb

## 2013-07-04 DIAGNOSIS — Z0000 Encounter for general adult medical examination without abnormal findings: Secondary | ICD-10-CM

## 2013-07-04 DIAGNOSIS — R002 Palpitations: Secondary | ICD-10-CM

## 2013-07-04 DIAGNOSIS — F3289 Other specified depressive episodes: Secondary | ICD-10-CM

## 2013-07-04 DIAGNOSIS — E785 Hyperlipidemia, unspecified: Secondary | ICD-10-CM

## 2013-07-04 DIAGNOSIS — F32A Depression, unspecified: Secondary | ICD-10-CM

## 2013-07-04 DIAGNOSIS — I10 Essential (primary) hypertension: Secondary | ICD-10-CM

## 2013-07-04 DIAGNOSIS — F329 Major depressive disorder, single episode, unspecified: Secondary | ICD-10-CM

## 2013-07-04 MED ORDER — OMEPRAZOLE 40 MG PO CPDR: DELAYED_RELEASE_CAPSULE | ORAL | Status: DC

## 2013-07-04 MED ORDER — AMITRIPTYLINE HCL 25 MG PO TABS: ORAL_TABLET | ORAL | Status: DC

## 2013-07-04 MED ORDER — TOPIRAMATE 25 MG PO TABS: ORAL_TABLET | ORAL | Status: DC

## 2013-07-04 NOTE — Assessment & Plan Note (Signed)
Wearing holter monitor. No further episodes of near syncope. Keep appt with Dr. Rockey Situ.

## 2013-07-04 NOTE — Progress Notes (Signed)
Subjective:    Patient ID: Krystal Walker, female    DOB: Dec 08, 1955, 58 y.o.   MRN: [id number]  HPI  58 yo pleasant female here for CPX.  Syncope/palpitations- saw Rollene Fare and was referred to Dr. Rockey Situ.  His note reviewed from earlier this month. Advised Holter monitor and metoprolol but she has not yet started the metoprolol.  She also has not been taking HCTZ and her BP has been normal.  Still mildly tachycardic but no longer as symptomatic.  She wants to wait until her follow up with Dr. Rockey Situ this month and after she is no longer wearing the holter monitor (she is currently wearing it).   S/p hysterectomy.  Not sexually active.  Mammogram UTD- 02/2013.  HLD- LDL elevated to 167.  Has been on statins in past- made her muscles ache.   Patient Active Problem List   Diagnosis Date Noted  . Near syncope 06/13/2013  . Palpitations 59/10/2013  . Abnormal mammogram with microcalcification 09/07/2012  . Fibrocystic disease of breast 09/07/2012  . Raynaud phenomenon 04/12/2012  . IBS (irritable bowel syndrome) 04/12/2012  . Routine general medical examination at a health care facility 03/15/2012  . Hypertension   . Hyperlipidemia   . Depression   . GERD (gastroesophageal reflux disease)    Past Medical History  Diagnosis Date  . Hypertension   . Hyperlipidemia   . Diabetes mellitus   . Depression   . IBS (irritable bowel syndrome)   . Osteoporosis   . Raynaud disease   . GERD (gastroesophageal reflux disease)   . 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 Raynaud phenomenon 1982  . Personal history of tobacco use, presenting hazards to health   . Breast screening, unspecified   . Mammographic microcalcification   . Special screening for malignant neoplasms, colon   . Bowel trouble 1997  . Radiation     for adenoid cystic carcinoma   Past Surgical History  Procedure Laterality Date  . Abdominal hysterectomy  2004    partial  . Salivary gland  surgery  1999  . Colonoscopy  2010  . Parotidectomy Right     adenoid cystic carcinoma  . Breast biopsy Left 02-23-12    left breast stereotactic biopsy; benign   History  Substance Use Topics  . Smoking status: Former Smoker -- 0.50 packs/day for 15 years    Types: Cigarettes  . Smokeless tobacco: Never Used     Comment: quit 1986  . Alcohol Use: No   Family History  Problem Relation Age of Onset  . Arrhythmia Mother     pacemaker  . Fainting Mother   . Hypertension Mother   . Hyperlipidemia Mother    Allergies  Allergen Reactions  . Latex   . Morphine And Related Nausea And Vomiting   Current Outpatient Prescriptions on File Prior to Visit  Medication Sig Dispense Refill  . CALCIUM-VITAMIN D PO 2 (two) times daily. Take one by mouth daily      . diphenhydrAMINE (BENADRYL) 25 mg capsule Take 25 mg by mouth at bedtime as needed for itching.      . fish oil-omega-3 fatty acids 1000 MG capsule Take one by mouth twice daily      . GLUCOSAMINE SULFATE PO Take 2000 mg's by mouth daily      . Lactase (LACTOSE INTOLERANCE PO) Take by mouth daily.       Marland Kitchen loratadine (CLARITIN) 10 MG tablet Take 10 mg by mouth  daily as needed for allergies.      . meloxicam (MOBIC) 15 MG tablet Take 1 tablet by mouth  daily  90 tablet  3  . metoprolol tartrate (LOPRESSOR) 25 MG tablet Take 1 tablet (25 mg total) by mouth 2 (two) times daily.  60 tablet  6  . Multiple Vitamin (MULTIVITAMIN) tablet Take 1 tablet by mouth daily.      . pravastatin (PRAVACHOL) 10 MG tablet Take 1 tablet by mouth  daily  90 tablet  0  . Probiotic Product (ALIGN PO) Take by mouth daily.       . pseudoephedrine (SUDAFED) 30 MG tablet Take 30 mg by mouth every 4 (four) hours as needed for congestion.      . traMADol (ULTRAM) 50 MG tablet Take 1 tablet (50 mg total) by mouth every 6 (six) hours as needed for pain.  40 tablet  4   No current facility-administered medications on file prior to visit.   The PMH, PSH, Social  History, Family History, Medications, and allergies have been reviewed in [facility] and have been updated if relevant.   Review of Systems See HPI Patient reports no  vision/ hearing changes,anorexia, weight change, fever ,adenopathy, persistant / recurrent hoarseness, swallowing issues, chest pain, edema,persistant / recurrent cough, hemoptysis, dyspnea(rest, exertional, paroxysmal nocturnal), gastrointestinal  bleeding (melena, rectal bleeding), abdominal pain, excessive heart burn, GU symptoms(dysuria, hematuria, pyuria, voiding/incontinence  Issues) , focal weakness, severe memory loss, concerning skin lesions, depression, anxiety, abnormal bruising/bleeding, major joint swelling, breast masses or abnormal vaginal bleeding.       Objective:   Physical Exam BP 128/78  Pulse 93  Temp(Src) 97.8 F (36.6 C) (Oral)  Ht 5' 4.5" (1.638 m)  Wt 141 lb 12 oz (64.297 kg)  BMI 23.96 kg/m2  SpO2 99%  General:  Well-developed,well-nourished,in no acute distress; alert,appropriate and cooperative throughout examination Head:  normocephalic and atraumatic.   Eyes:  vision grossly intact, pupils equal, pupils round, and pupils reactive to light.   Ears:  R ear normal and L ear normal.   Nose:  no external deformity.   Mouth:  good dentition.   Lungs:  Normal respiratory effort, chest expands symmetrically. Lungs are clear to auscultation, no crackles or wheezes. Heart:  Normal rate and regular rhythm. S1 and S2 normal without gallop, murmur, click, rub or other extra sounds. Extremities:  No clubbing, cyanosis, edema, or deformity noted with normal full range of motion of all joints.   Neurologic:  alert & oriented X3 and gait normal.   Skin:  Intact without suspicious lesions or rashes Psych:  Cognition and judgment appear intact.      Assessment & Plan:

## 2013-07-04 NOTE — Assessment & Plan Note (Signed)
Stable without rx.

## 2013-07-04 NOTE — Assessment & Plan Note (Signed)
Reviewed preventive care protocols, scheduled due services, and updated immunizations Discussed nutrition, exercise, diet, and healthy lifestyle.  Orders Placed This Encounter  Procedures  . Comprehensive metabolic panel  . Lipid panel    

## 2013-07-04 NOTE — Assessment & Plan Note (Signed)
Check labs today.

## 2013-07-04 NOTE — Patient Instructions (Addendum)
Good to see you. We will call you with your lab results. Dr. Rockey Situ will send me the results of your Holter monitor.  It's ok to wait until after you get your Holter Monitor evaluated to start the metoprolol unless you become symptomatic or your pulse increases again.

## 2013-07-04 NOTE — Progress Notes (Signed)
Pre-visit discussion using our clinic review tool. No additional management support is needed unless otherwise documented below in the visit note.  

## 2013-07-05 LAB — COMPREHENSIVE METABOLIC PANEL
A/G RATIO: 3 — AB (ref 1.1–2.5)
ALK PHOS: 65 IU/L (ref 39–117)
ALT: 33 IU/L — ABNORMAL HIGH (ref 0–32)
AST: 33 IU/L (ref 0–40)
Albumin: 4.5 g/dL (ref 3.5–5.5)
BUN / CREAT RATIO: 22 (ref 9–23)
BUN: 21 mg/dL (ref 6–24)
CO2: 24 mmol/L (ref 18–29)
CREATININE: 0.96 mg/dL (ref 0.57–1.00)
Calcium: 9.5 mg/dL (ref 8.7–10.2)
Chloride: 103 mmol/L (ref 97–108)
GFR calc Af Amer: 75 mL/min/{1.73_m2} (ref >59)
GFR, EST NON AFRICAN AMERICAN: 65 mL/min/{1.73_m2} (ref >59)
GLOBULIN, TOTAL: 1.5 g/dL (ref 1.5–4.5)
Glucose: 89 mg/dL (ref 65–99)
POTASSIUM: 4.1 mmol/L (ref 3.5–5.2)
SODIUM: 141 mmol/L (ref 134–144)
Total Bilirubin: 0.3 mg/dL (ref 0.0–1.2)
Total Protein: 6 g/dL (ref 6.0–8.5)

## 2013-07-05 LAB — LIPID PANEL W/O CHOL/HDL RATIO
Cholesterol, Total: 189 mg/dL (ref 100–199)
HDL: 74 mg/dL (ref >39)
LDL Calculated: 102 mg/dL — ABNORMAL HIGH (ref 0–99)
Triglycerides: 65 mg/dL (ref 0–149)
VLDL Cholesterol Cal: 13 mg/dL (ref 5–40)

## 2013-07-07 ENCOUNTER — Other Ambulatory Visit: Payer: Self-pay | Admitting: Family Medicine

## 2013-07-07 NOTE — Telephone Encounter (Signed)
Please call pt to see if she is taking HCTZ

## 2013-07-07 NOTE — Telephone Encounter (Signed)
Pt requesting medication refill, but I dont show it to be on pts current medication list. Last OV was CPE on 07/04/13. pls advise

## 2013-07-07 NOTE — Telephone Encounter (Signed)
Lm on pts vm requesting a call back 

## 2013-07-07 NOTE — Telephone Encounter (Signed)
Pt called back and she is no longer taking this.  rx denial sent back to [facility]

## 2013-07-13 ENCOUNTER — Encounter: Payer: Self-pay | Admitting: Family Medicine

## 2013-07-16 ENCOUNTER — Other Ambulatory Visit: Payer: Self-pay | Admitting: Family Medicine

## 2013-07-25 ENCOUNTER — Encounter: Payer: Self-pay | Admitting: Cardiovascular Disease

## 2013-07-25 ENCOUNTER — Ambulatory Visit (INDEPENDENT_AMBULATORY_CARE_PROVIDER_SITE_OTHER): Payer: 59 | Admitting: Cardiovascular Disease

## 2013-07-25 VITALS — BP 120/80 | HR 100 | Ht 64.0 in | Wt 143.5 lb

## 2013-07-25 DIAGNOSIS — I10 Essential (primary) hypertension: Secondary | ICD-10-CM

## 2013-07-25 DIAGNOSIS — R002 Palpitations: Secondary | ICD-10-CM

## 2013-07-25 DIAGNOSIS — R55 Syncope and collapse: Secondary | ICD-10-CM

## 2013-07-25 NOTE — Assessment & Plan Note (Signed)
30 day monitor did not show any significant arrhythmia. If she continues to have symptoms on a periodic basis, concerning for arrhythmia, may need a implantable loop monitor

## 2013-07-25 NOTE — Assessment & Plan Note (Signed)
Blood pressure is well controlled on today's visit. No changes made to the medications. Suggested she take metoprolol only as needed for palpitations

## 2013-07-25 NOTE — Progress Notes (Signed)
Patient ID: Krystal Walker, female    DOB: June 15, 1955, 58 y.o.   MRN: [id number]  HPI Comments: Krystal Walker is a very pleasant 58 year old woman with history of throat cancer/salivary gland, strong family history of stroke, with previous history near syncope and palpitations at nighttime.  long history of headaches,   On her last clinic visit, she reported having an episode where she donated blood in the morning. She had a good meal after donating blood. That evening she reports having acute onset of dizziness while in the kitchen. She had to lay down on the floor and felt very dizzy. She lay there until symptoms resolve and at home she reported systolic pressures as low as 60/40. EMTs were called that recorded blood pressure of 347 systolic by her report.   similar episode several years ago while she was eating cereal in the morning. At that time she again called EMTs.  In followup today, we reviewed a 30 day monitor that showed no significant arrhythmia, showing predominantly normal sinus rhythm Her blood pressure at home has been running in the 425 systolic range. She does report having rare episodes of vertigo described as a spinning sensation when she turns her head in bed She is otherwise active, does exercise HCTZ was recently previously for suspected low blood pressure.  She was prescribed metoprolol tartrate to take twice a day on her last clinic visit. She has not started this as she was concerned it would push her heart rate and blood pressure low  Stress test March 2012 showed no ischemia, normal ejection fraction. Prior EKG in March 2012 showing T-wave abnormality in leads V5, V6 EKG today showing normal sinus rhythm with rate 100 beats per minute with nonspecific T wave abnormality in leads V2 through V6, 2, 3, aVF   Outpatient Encounter Prescriptions as of 07/25/2013  Medication Sig  . amitriptyline (ELAVIL) 25 MG tablet Take 1 tablet (25 mg total) by mouth at bedtime.  Marland Kitchen  CALCIUM-VITAMIN D PO 2 (two) times daily. Take one by mouth daily  . diphenhydrAMINE (BENADRYL) 25 mg capsule Take 25 mg by mouth at bedtime as needed for itching.  . fish oil-omega-3 fatty acids 1000 MG capsule Take one by mouth twice daily  . GLUCOSAMINE SULFATE PO Take 2000 mg's by mouth daily  . Lactase (LACTOSE INTOLERANCE PO) Take by mouth daily.   Marland Kitchen loratadine (CLARITIN) 10 MG tablet Take 10 mg by mouth daily as needed for allergies.  . meloxicam (MOBIC) 15 MG tablet Take 1 tablet by mouth  daily  . Multiple Vitamin (MULTIVITAMIN) tablet Take 1 tablet by mouth daily.  Marland Kitchen omeprazole (PRILOSEC) 40 MG capsule Take 1 capsule by mouth  every day  . pravastatin (PRAVACHOL) 10 MG tablet Take 1 tablet by mouth  daily  . Probiotic Product (ALIGN PO) Take by mouth daily.   . pseudoephedrine (SUDAFED) 30 MG tablet Take 30 mg by mouth every 4 (four) hours as needed for congestion.  . topiramate (TOPAMAX) 25 MG tablet Take 1 tablet by mouth  daily.  . traMADol (ULTRAM) 50 MG tablet Take 1 tablet (50 mg total) by mouth every 6 (six) hours as needed for pain.  . metoprolol tartrate (LOPRESSOR) 25 MG tablet Take 1 tablet (25 mg total) by mouth 2 (two) times daily.     Review of Systems  Constitutional: Negative.   HENT: Negative.   Eyes: Negative.   Respiratory: Negative.   Gastrointestinal: Negative.   Endocrine: Negative.   Musculoskeletal: Negative.  Skin: Negative.   Allergic/Immunologic: Negative.   Hematological: Negative.   Psychiatric/Behavioral: Negative.   All other systems reviewed and are negative.    BP 120/80  Pulse 100  Ht 5\' 4"  (1.626 m)  Wt 143 lb 8 oz (65.091 kg)  BMI 24.62 kg/m2  Physical Exam  Nursing note and vitals reviewed. Constitutional: She is oriented to person, place, and time. She appears well-developed and well-nourished.  HENT:  Head: Normocephalic.  Nose: Nose normal.  Mouth/Throat: Oropharynx is clear and moist.  Eyes: Conjunctivae are normal.  Pupils are equal, round, and reactive to light.  Neck: Normal range of motion. Neck supple. No JVD present.  Cardiovascular: Normal rate, regular rhythm, S1 normal, S2 normal, normal heart sounds and intact distal pulses.  Exam reveals no gallop and no friction rub.   No murmur heard. Pulmonary/Chest: Effort normal and breath sounds normal. No respiratory distress. She has no wheezes. She has no rales. She exhibits no tenderness.  Abdominal: Soft. Bowel sounds are normal. She exhibits no distension. There is no tenderness.  Musculoskeletal: Normal range of motion. She exhibits no edema and no tenderness.  Lymphadenopathy:    She has no cervical adenopathy.  Neurological: She is alert and oriented to person, place, and time. Coordination normal.  Skin: Skin is warm and dry. No rash noted. No erythema.  Psychiatric: She has a normal mood and affect. Her behavior is normal. Judgment and thought content normal.    Assessment and Plan

## 2013-07-25 NOTE — Patient Instructions (Signed)
You are doing well. No medication changes were made.  Please call us if you have new issues that need to be addressed before your next appt.  Your physician wants you to follow-up in: 12 months.  You will receive a reminder letter in the mail two months in advance. If you don't receive a letter, please call our office to schedule the follow-up appointment. 

## 2013-08-01 ENCOUNTER — Telehealth: Payer: Self-pay

## 2013-08-01 ENCOUNTER — Encounter: Payer: Self-pay | Admitting: Family Medicine

## 2013-08-01 NOTE — Telephone Encounter (Signed)
Event monitor results: "NSR w/ rare PVCs".  Left message for pt to call back.

## 2013-08-01 NOTE — Telephone Encounter (Signed)
Krystal Liberty, do you know what messages they're receiving?

## 2013-08-01 NOTE — Telephone Encounter (Signed)
Reviewed results w/ pt.   She verbalizes understanding and will call w/ any questions or concerns.

## 2013-08-03 ENCOUNTER — Other Ambulatory Visit: Payer: Self-pay

## 2013-08-03 ENCOUNTER — Encounter (INDEPENDENT_AMBULATORY_CARE_PROVIDER_SITE_OTHER): Payer: 59

## 2013-08-03 DIAGNOSIS — R55 Syncope and collapse: Secondary | ICD-10-CM

## 2013-08-03 DIAGNOSIS — R002 Palpitations: Secondary | ICD-10-CM

## 2013-08-03 DIAGNOSIS — R000 Tachycardia, unspecified: Secondary | ICD-10-CM

## 2013-08-04 ENCOUNTER — Encounter: Payer: Self-pay | Admitting: Family Medicine

## 2013-08-05 NOTE — Telephone Encounter (Signed)
Messaged pt and informed her the only appt I'm showing for 08/03/13 was with cardiology, in which she attended.

## 2013-09-01 ENCOUNTER — Encounter: Payer: 59 | Admitting: Family Medicine

## 2013-09-01 ENCOUNTER — Other Ambulatory Visit: Payer: Self-pay | Admitting: Family Medicine

## 2013-09-17 ENCOUNTER — Other Ambulatory Visit: Payer: Self-pay | Admitting: Family Medicine

## 2013-10-13 ENCOUNTER — Other Ambulatory Visit: Payer: Self-pay | Admitting: Family Medicine

## 2013-10-13 ENCOUNTER — Encounter: Payer: Self-pay | Admitting: Family Medicine

## 2013-10-14 MED ORDER — AMITRIPTYLINE HCL 25 MG PO TABS: ORAL_TABLET | ORAL | Status: DC

## 2013-10-14 NOTE — Telephone Encounter (Signed)
Pt left v/m; pt returning call and will cb after 3 pm. [phone].

## 2013-10-14 NOTE — Telephone Encounter (Signed)
Lm on pts vm requesting a call back. Rx sent to mail order pharmacy. Contacted pt to confirm whether or not she is wanting 30days sent to local pharmacy since medication is about to run out.

## 2013-10-15 MED ORDER — AMITRIPTYLINE HCL 25 MG PO TABS: ORAL_TABLET | ORAL | Status: DC

## 2013-10-15 NOTE — Addendum Note (Signed)
Addended by: Despina Hidden on: 10/15/2013 09:31 AM   Modules accepted: Orders

## 2013-12-01 ENCOUNTER — Other Ambulatory Visit: Payer: Self-pay | Admitting: *Deleted

## 2013-12-01 MED ORDER — MELOXICAM 15 MG PO TABS: ORAL_TABLET | ORAL | Status: DC

## 2013-12-13 ENCOUNTER — Telehealth: Payer: Self-pay

## 2013-12-13 ENCOUNTER — Other Ambulatory Visit: Payer: Self-pay | Admitting: Family Medicine

## 2013-12-13 MED ORDER — FLUCONAZOLE 150 MG PO TABS: 150.0000 mg | ORAL_TABLET | Freq: Once | ORAL | Status: AC

## 2013-12-13 NOTE — Telephone Encounter (Signed)
Rx sent.  Needs to be seen if symptoms do not improve.

## 2013-12-13 NOTE — Telephone Encounter (Signed)
Pt requesting medication refill. Last f/u appt 06/2013 with no future appts scheduled. Pt requesting 33mo supply. pls advise

## 2013-12-13 NOTE — Telephone Encounter (Signed)
Pt was taking antibiotic from Dr Richardson Landry for sinus infection one week ago; now has brownish vaginal discharge with odor;pt request oral diflucan to [facility].Please advise.

## 2013-12-14 NOTE — Telephone Encounter (Signed)
Lm on pts vm informing her Rx has been sent to requested pharmacy. Advised per Dr Deborra Medina if s/s do not subside

## 2014-01-04 ENCOUNTER — Encounter: Payer: Self-pay | Admitting: Family Medicine

## 2014-01-16 ENCOUNTER — Encounter: Payer: Self-pay | Admitting: Family Medicine

## 2014-01-17 MED ORDER — TRAMADOL HCL 50 MG PO TABS: 50.0000 mg | ORAL_TABLET | Freq: Four times a day (QID) | ORAL | Status: DC | PRN

## 2014-01-17 NOTE — Telephone Encounter (Signed)
Tramadol called to [facility].

## 2014-01-17 NOTE — Addendum Note (Signed)
Addended by: Lucille Passy on: 01/17/2014 09:56 AM   Modules accepted: Orders

## 2014-01-17 NOTE — Telephone Encounter (Signed)
Ok to refill rx as entered.

## 2014-01-17 NOTE — Telephone Encounter (Signed)
Ok to refill #40, 3 refills.  I haven't seen in a long time, I will let PCP's take over writing tramadol and other arthritis medications.   It looks like the patient is past-due for a check-up with Dr. Deborra Medina, I will cc: Dr. Deborra Medina to see if she wants to see her for a physical.

## 2014-04-10 ENCOUNTER — Other Ambulatory Visit: Payer: Self-pay | Admitting: Family Medicine

## 2014-04-10 ENCOUNTER — Encounter: Payer: Self-pay | Admitting: Cardiovascular Disease

## 2014-04-24 ENCOUNTER — Ambulatory Visit (INDEPENDENT_AMBULATORY_CARE_PROVIDER_SITE_OTHER): Payer: 59 | Admitting: Family Medicine

## 2014-04-24 ENCOUNTER — Ambulatory Visit (INDEPENDENT_AMBULATORY_CARE_PROVIDER_SITE_OTHER)
Admission: RE | Admit: 2014-04-24 | Discharge: 2014-04-24 | Disposition: A | Discharge status: Home / Self Care | Payer: 59 | Source: Ambulatory Visit | Attending: Family Medicine | Admitting: Family Medicine

## 2014-04-24 ENCOUNTER — Encounter: Payer: Self-pay | Admitting: Family Medicine

## 2014-04-24 VITALS — BP 124/82 | HR 96 | Temp 98.0°F | Wt 137.5 lb

## 2014-04-24 DIAGNOSIS — M5416 Radiculopathy, lumbar region: Secondary | ICD-10-CM

## 2014-04-24 DIAGNOSIS — M5417 Radiculopathy, lumbosacral region: Secondary | ICD-10-CM

## 2014-04-24 MED ORDER — MELOXICAM 15 MG PO TABS: ORAL_TABLET | ORAL | Status: DC

## 2014-04-24 MED ORDER — PREDNISONE 20 MG PO TABS: ORAL_TABLET | ORAL | Status: DC

## 2014-04-24 MED ORDER — TOPIRAMATE 25 MG PO TABS: ORAL_TABLET | ORAL | Status: DC

## 2014-04-24 NOTE — Progress Notes (Signed)
Pre visit review using our clinic review tool, if applicable. No additional management support is needed unless otherwise documented below in the visit note. 

## 2014-04-24 NOTE — Patient Instructions (Addendum)
Good to see you. Please stop taking Meloxicam.  Start taking prednisone as directed.  We will call you with your xray results.

## 2014-04-24 NOTE — Progress Notes (Signed)
Subjective:   Patient ID: Krystal Walker, female    DOB: Jan 06, 1956, 58 y.o.   MRN: [id number]  Krystal Walker is a pleasant 58 y.o. year old female who presents to clinic today with Follow-up and Leg Pain  on 04/24/2014  HPI: Wanted to discuss several issues.  Does not need me to refill meds today- she will call Dr. Rockey Situ for her metoprolol rx.  Right sided back pain with radiculopathy- ongoing for months.  She thinks it started at work- does repetitive movements and lifts things.  A month or so after it started, she did drop something heavy on her right feel but this only aggravated her symptoms.  Taking Meloxicam daily and prn tramadol which has helped with her back pain but still having numbness from "calf down."  Intermittent, not worsened by exertion or relieved by rest.  Current Outpatient Prescriptions on File Prior to Visit  Medication Sig Dispense Refill  . amitriptyline (ELAVIL) 25 MG tablet Take 1 tablet by mouth at  bedtime 90 tablet 0  . CALCIUM-VITAMIN D PO 2 (two) times daily. Take one by mouth daily    . diphenhydrAMINE (BENADRYL) 25 mg capsule Take 25 mg by mouth at bedtime as needed for itching.    . fish oil-omega-3 fatty acids 1000 MG capsule Take one by mouth twice daily    . GLUCOSAMINE SULFATE PO Take 2000 mg's by mouth daily    . loratadine (CLARITIN) 10 MG tablet Take 10 mg by mouth daily as needed for allergies.    . metoprolol tartrate (LOPRESSOR) 25 MG tablet Take 1 tablet (25 mg total) by mouth 2 (two) times daily. (Patient taking differently: Take 25 mg by mouth daily. ) 60 tablet 6  . Multiple Vitamin (MULTIVITAMIN) tablet Take 1 tablet by mouth daily.    Marland Kitchen omeprazole (PRILOSEC) 40 MG capsule Take 1 capsule by mouth  every day 90 capsule 1  . pravastatin (PRAVACHOL) 10 MG tablet Take 1 tablet by mouth  daily 90 tablet 1  . Probiotic Product (ALIGN PO) Take by mouth daily.     . traMADol (ULTRAM) 50 MG tablet Take 1 tablet (50 mg total) by mouth every 6  (six) hours as needed. 40 tablet 3   No current facility-administered medications on file prior to visit.    Allergies  Allergen Reactions  . Latex   . Morphine And Related Nausea And Vomiting    Past Medical History  Diagnosis Date  . Hypertension   . Hyperlipidemia   . Diabetes mellitus   . Depression   . IBS (irritable bowel syndrome)   . Osteoporosis   . Raynaud disease   . GERD (gastroesophageal reflux disease)   . 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 Raynaud phenomenon 1982  . Personal history of tobacco use, presenting hazards to health   . Breast screening, unspecified   . Mammographic microcalcification   . Special screening for malignant neoplasms, colon   . Bowel trouble 1997  . Radiation     for adenoid cystic carcinoma    Past Surgical History  Procedure Laterality Date  . Abdominal hysterectomy  2004    partial  . Salivary gland surgery  1999  . Colonoscopy  2010  . Parotidectomy Right     adenoid cystic carcinoma  . Breast biopsy Left 02-23-12    left breast stereotactic biopsy; benign    Family History  Problem Relation Age of Onset  .  Arrhythmia Mother     pacemaker  . Fainting Mother   . Hypertension Mother   . Hyperlipidemia Mother     History   Social History  . Marital Status: Single    Spouse Name: N/A    Number of Children: N/A  . Years of Education: N/A   Occupational History  . Not on file.   Social History Main Topics  . Smoking status: Former Smoker -- 0.50 packs/day for 15 years    Types: Cigarettes  . Smokeless tobacco: Never Used     Comment: quit 1986  . Alcohol Use: No  . Drug Use: No  . Sexual Activity: Not on file   Other Topics Concern  . Not on file   Social History Narrative   The PMH, PSH, Social History, Family History, Medications, and allergies have been reviewed in [facility], and have been updated if relevant.    Review of Systems  Constitutional: Negative.   Genitourinary:  Negative.   Musculoskeletal: Positive for back pain. Negative for neck pain and neck stiffness.  Neurological: Positive for numbness.  All other systems reviewed and are negative.      Objective:    BP 124/82 mmHg  Pulse 96  Temp(Src) 98 F (36.7 C) (Oral)  Wt 137 lb 8 oz (62.37 kg)  SpO2 99%   Physical Exam  Constitutional: She is oriented to person, place, and time. She appears well-developed and well-nourished. No distress.  HENT:  Head: Normocephalic.  Musculoskeletal:       Lumbar back: She exhibits normal range of motion, no tenderness, no bony tenderness, no swelling and no spasm.       Right lower leg: She exhibits no tenderness.  +/- SLR right, neg SLR left Neg fabers bilaterally.  Neurological: She is alert and oriented to person, place, and time. No cranial nerve deficit.  Skin: Skin is warm and dry.  Psychiatric: She has a normal mood and affect. Her behavior is normal. Judgment and thought content normal. Cognition and memory are normal.  Nursing note and vitals reviewed.         Assessment & Plan:   Lumbar back pain with radiculopathy affecting right lower extremity - Plan: DG Lumbar Spine Complete No Follow-up on file.

## 2014-04-24 NOTE — Assessment & Plan Note (Signed)
New- ongoing. Back pain improved with NSAIDs but having persistent radiculopathy. Will get lumbar xray today. Given duration of symptoms, I did advise PT but she declined stating that she did not have time. Short course of prednisone- advised to D/C meloxicam and other NSAIDs while taking it. May needs sports med referral vs MRI. The patient indicates understanding of these issues and agrees with the plan.

## 2014-04-28 ENCOUNTER — Other Ambulatory Visit: Payer: Self-pay | Admitting: Family Medicine

## 2014-05-03 ENCOUNTER — Encounter: Payer: Self-pay | Admitting: Family Medicine

## 2014-05-03 ENCOUNTER — Ambulatory Visit (INDEPENDENT_AMBULATORY_CARE_PROVIDER_SITE_OTHER): Payer: 59 | Admitting: Family Medicine

## 2014-05-03 VITALS — BP 110/80 | HR 94 | Temp 98.4°F | Ht 64.0 in | Wt 139.0 lb

## 2014-05-03 DIAGNOSIS — M5417 Radiculopathy, lumbosacral region: Secondary | ICD-10-CM

## 2014-05-03 DIAGNOSIS — M5416 Radiculopathy, lumbar region: Secondary | ICD-10-CM

## 2014-05-03 MED ORDER — GABAPENTIN 300 MG PO CAPS: 300.0000 mg | ORAL_CAPSULE | Freq: Three times a day (TID) | ORAL | Status: DC

## 2014-05-03 NOTE — Progress Notes (Signed)
Dr. Frederico Hamman T. Mandy Peeks, MD, [facility] Medicine [place], [address] Phone: [phone] Fax: [phone]  05/03/2014  Patient: Krystal Walker, MRN: [id number], DOB: [birth year], 58 y.o.  Primary Physician:  Arnette Norris, MD  Chief Complaint: Numbness   Subjective:   DARTHA ROZZELL is a 58 y.o. very pleasant female patient who presents with the following:  Had been doing some [facility] on [facility] the senior center. Back did not feel right and numbness in calf and foot. Has been ongoing since around 02/2014.   Having some radiating pain on the R. Ladder collapsed on her foot. Had to jump off of the ladder. Assessment certainly did not help.  The patient did do one course of 40 mg of prednisone daily x7 days given by my partner. She still is having some numbness and tingling in the lower extremity distal to the knee. She has not had any prior operative intervention in her spine. She is relatively active, and it does feel a little bit better when she is stretching her hips and her piriformis region.  Past Medical History, Surgical History, Social History, Family History, Problem List, Medications, and Allergies have been reviewed and updated if relevant.  GEN: no acute illness or fever CV: No chest pain or shortness of breath MSK: detailed above Neuro: neurological signs are described above ROS O/w per HPI  Objective:   BP 110/80 mmHg  Pulse 94  Temp(Src) 98.4 F (36.9 C) (Oral)  Ht 5\' 4"  (1.626 m)  Wt 139 lb (63.05 kg)  BMI 23.85 kg/m2   GEN: Well-developed,well-nourished,in no acute distress; alert,appropriate and cooperative throughout examination HEENT: Normocephalic and atraumatic without obvious abnormalities. Ears, externally no deformities PULM: Breathing comfortably in no respiratory distress EXT: No clubbing, cyanosis, or edema PSYCH: Normally interactive. Cooperative during the interview. Pleasant. Friendly  and conversant. Not anxious or depressed appearing. Normal, full affect.  Range of motion at  the waist: Flexion, extension, lateral bending and rotation:  Fairly normal with minimal limitation  No echymosis or edema Rises to examination table with mild difficulty Gait: minimally antalgic  Inspection/Deformity: N Paraspinus Tenderness: mild in character from L3-S1 bilaterally  B Ankle Dorsiflexion (L5,4): 5/5 B Great Toe Dorsiflexion (L5,4): 5/5 Heel Walk (L5): WNL Toe Walk (S1): WNL Rise/Squat (L4): WNL, mild pain  SENSORY B Medial Foot (L4): WNL B Dorsum (L5): WNL B Lateral (S1): WNL Light Touch: WNL Pinprick: slightly decreased plantar, edge laterally and medially  REFLEXES Knee (L4): 2+ Ankle (S1): 2+  B SLR, seated: neg B SLR, supine: neg B FABER: neg B Reverse FABER: neg B Greater Troch: NT B Log Roll: neg B Stork: NT B Sciatic Notch: NT   Radiology: Dg Lumbar Spine Complete  04/24/2014   CLINICAL DATA:  Low back and right lower extremity radicular symptoms; duration of 2 months  EXAM: LUMBAR SPINE - COMPLETE 4+ VIEW  COMPARISON:  Lumbar spine series of August 16, 2012  FINDINGS: The lumbar vertebral bodies are preserved in height. The intervertebral disc space heights are well maintained. There is minimal anterolisthesis of L4 with respect L5 which is new. No pars defects are demonstrated. There is facet joint hypertrophy at L4-5 and at L5-S1. The pedicles and transverse processes are intact. The observed portions of the sacrum are unremarkable.  IMPRESSION: There is mild degenerative facet joint change at L4-5 and L5-S1 and new minimal anterolisthesis of L4 with respect L5 likely on the basis  of degenerative disc and facet joint disease.   Electronically Signed   By: David  Martinique   On: 04/24/2014 13:07   *RADIOLOGY REPORT*  Clinical Data: Back pain  LUMBAR SPINE - COMPLETE 4+ VIEW  Comparison: None.  Findings: Tiny anterior endplate spurs [id number]. Mild  bilateral facet degenerative hypertrophy L4-5 and L5-S1. There is no evidence of lumbar spine fracture. Alignment is normal. Intervertebral disc spaces are maintained.  IMPRESSION: 1. Negative for fracture or other acute abnormality. 2. Mild degenerative changes as above.   Original Report Authenticated By: D. Wallace Going, MD  Assessment and Plan:   Lumbar back pain with radiculopathy affecting right lower extremity  I reviewed the patient's case and her recent films.  There is no significant nerve encroachment or disc herniation appreciated.  There is a very good chance that this can be managed conservatively.  We are going to place her on gabapentin and titrate up the dose.  No previous neuropathic pain agents have been tried.  Follow-up: Return in about 2 months (around 07/03/2014).  New Prescriptions   GABAPENTIN (NEURONTIN) 300 MG CAPSULE    Take 1 capsule (300 mg total) by mouth 3 (three) times daily.   No orders of the defined types were placed in this encounter.    Signed,  Maud Deed. Ottie Tillery, MD   Patient's Medications  New Prescriptions   GABAPENTIN (NEURONTIN) 300 MG CAPSULE    Take 1 capsule (300 mg total) by mouth 3 (three) times daily.  Previous Medications   AMITRIPTYLINE (ELAVIL) 25 MG TABLET    Take 1 tablet by mouth at  bedtime   CALCIUM-VITAMIN D PO    2 (two) times daily. Take one by mouth daily   DIPHENHYDRAMINE (BENADRYL) 25 MG CAPSULE    Take 25 mg by mouth at bedtime as needed for itching.   FISH OIL-OMEGA-3 FATTY ACIDS 1000 MG CAPSULE    Take one by mouth twice daily   GLUCOSAMINE SULFATE PO    Take 2000 mg's by mouth daily   LORATADINE (CLARITIN) 10 MG TABLET    Take 10 mg by mouth daily as needed for allergies.   MELOXICAM (MOBIC) 15 MG TABLET    Take 1 tablet by mouth  daily   METOPROLOL TARTRATE (LOPRESSOR) 25 MG TABLET    Take 1 tablet (25 mg total) by mouth 2 (two) times daily.   MULTIPLE VITAMIN (MULTIVITAMIN) TABLET    Take 1 tablet by  mouth daily.   OMEPRAZOLE (PRILOSEC) 40 MG CAPSULE    Take 1 capsule by mouth  every day   PRAVASTATIN (PRAVACHOL) 10 MG TABLET    Take 1 tablet by mouth  daily   PROBIOTIC PRODUCT (ALIGN PO)    Take by mouth daily.    TOPIRAMATE (TOPAMAX) 25 MG TABLET    Take 1 tablet by mouth  daily   TRAMADOL (ULTRAM) 50 MG TABLET    Take 1 tablet (50 mg total) by mouth every 6 (six) hours as needed.  Modified Medications   No medications on file  Discontinued Medications   PREDNISONE (DELTASONE) 20 MG TABLET    2 tabs by mouth daily x 7 days

## 2014-05-03 NOTE — Patient Instructions (Signed)

## 2014-05-03 NOTE — Progress Notes (Signed)
Pre visit review using our clinic review tool, if applicable. No additional management support is needed unless otherwise documented below in the visit note. 

## 2014-07-03 ENCOUNTER — Ambulatory Visit: Payer: 59 | Admitting: Family Medicine

## 2014-07-03 ENCOUNTER — Ambulatory Visit (INDEPENDENT_AMBULATORY_CARE_PROVIDER_SITE_OTHER): Payer: 59 | Admitting: Family Medicine

## 2014-07-03 ENCOUNTER — Encounter: Payer: Self-pay | Admitting: Family Medicine

## 2014-07-03 VITALS — BP 122/80 | HR 98 | Temp 98.3°F | Ht 64.0 in | Wt 150.5 lb

## 2014-07-03 DIAGNOSIS — M5417 Radiculopathy, lumbosacral region: Secondary | ICD-10-CM

## 2014-07-03 DIAGNOSIS — M5416 Radiculopathy, lumbar region: Secondary | ICD-10-CM

## 2014-07-03 MED ORDER — GABAPENTIN 300 MG PO CAPS: 900.0000 mg | ORAL_CAPSULE | Freq: Three times a day (TID) | ORAL | Status: DC

## 2014-07-03 MED ORDER — MELOXICAM 15 MG PO TABS: ORAL_TABLET | ORAL | Status: DC

## 2014-07-03 MED ORDER — PRAVASTATIN SODIUM 10 MG PO TABS: ORAL_TABLET | ORAL | Status: DC

## 2014-07-03 MED ORDER — GABAPENTIN 300 MG PO CAPS: 300.0000 mg | ORAL_CAPSULE | Freq: Three times a day (TID) | ORAL | Status: DC

## 2014-07-03 MED ORDER — AMITRIPTYLINE HCL 25 MG PO TABS: ORAL_TABLET | ORAL | Status: DC

## 2014-07-03 MED ORDER — TOPIRAMATE 25 MG PO TABS: ORAL_TABLET | ORAL | Status: DC

## 2014-07-03 NOTE — Progress Notes (Signed)
Pre visit review using our clinic review tool, if applicable. No additional management support is needed unless otherwise documented below in the visit note. 

## 2014-07-03 NOTE — Progress Notes (Signed)
Dr. Frederico Hamman T. Marin Milley, MD, [facility] Medicine [place], [address] Phone: [phone] Fax: [phone]  07/03/2014  Patient: Krystal Walker, MRN: [id number], DOB: 11-May-1956, 59 y.o.  Primary Physician:  Arnette Norris, MD  Chief Complaint: No chief complaint on file.   Subjective:   Krystal Walker is a 59 y.o. very pleasant female patient who presents with the following:  Back is feeling a lot better. Up to 900 at night and AM. 600 mg of gabapentin during the daytime. She is a little bit sleepier during the daytime, but overall she is unable to tolerate this quite a bit. Her back pain is effectively completely gone. She is still having some neuropathic symptoms and some decreased sensation on the right side, but it improved significantly compared to before.  She also brings up another of issues including poor sleep, and potential adult ADD, and difficulty getting work done.  -  05/03/2014 Last OV with Owens Loffler, MD  Had been doing some exercise on the floor at the senior center. Back did not feel right and numbness in calf and foot. Has been ongoing since around 02/2014.   Having some radiating pain on the R. Ladder collapsed on her foot. Had to jump off of the ladder. Assessment certainly did not help.  The patient did do one course of 40 mg of prednisone daily x7 days given by my partner. She still is having some numbness and tingling in the lower extremity distal to the knee. She has not had any prior operative intervention in her spine. She is relatively active, and it does feel a little bit better when she is stretching her hips and her piriformis region.  Past Medical History, Surgical History, Social History, Family History, Problem List, Medications, and Allergies have been reviewed and updated if relevant.  GEN: no acute illness or fever CV: No chest pain or shortness of breath MSK: detailed above Neuro:  neurological signs are described above ROS O/w per HPI  Objective:   Filed Vitals:   07/03/14 0809  BP: 122/80  Pulse: 98  Temp: 98.3 F (36.8 C)  TempSrc: Oral  Height: 5\' 4"  (1.626 m)  Weight: 150 lb 8 oz (68.266 kg)   GEN: Well-developed,well-nourished,in no acute distress; alert,appropriate and cooperative throughout examination HEENT: Normocephalic and atraumatic without obvious abnormalities. Ears, externally no deformities PULM: Breathing comfortably in no respiratory distress EXT: No clubbing, cyanosis, or edema PSYCH: Normally interactive. Cooperative during the interview. Pleasant. Friendly and conversant. Not anxious or depressed appearing. Normal, full affect.  Range of motion at  the waist: Flexion, extension, lateral bending and rotation:  Fairly normal with minimal limitation  No echymosis or edema Rises to examination table with mild difficulty Gait: minimally antalgic  Inspection/Deformity: N Paraspinus Tenderness: NT  B Ankle Dorsiflexion (L5,4): 5/5 B Great Toe Dorsiflexion (L5,4): 5/5 Heel Walk (L5): WNL Toe Walk (S1): WNL Rise/Squat (L4): WNL, mild pain  SENSORY B Medial Foot (L4): WNL B Dorsum (L5): WNL B Lateral (S1): WNL Light Touch: WNL Pinprick: slightly decreased plantar, edge laterally, R  REFLEXES Knee (L4): 2+ Ankle (S1): 2+  B SLR, seated: neg B SLR, supine: neg B FABER: neg B Reverse FABER: neg B Greater Troch: NT B Log Roll: neg B Stork: NT B Sciatic Notch: NT   Radiology: No results found. *RADIOLOGY REPORT*  Clinical Data: Back pain  LUMBAR SPINE - COMPLETE 4+ VIEW  Comparison: None.  Findings: Tiny  anterior endplate spurs [id number]. Mild bilateral facet degenerative hypertrophy L4-5 and L5-S1. There is no evidence of lumbar spine fracture. Alignment is normal. Intervertebral disc spaces are maintained.  IMPRESSION: 1. Negative for fracture or other acute abnormality. 2. Mild degenerative changes as  above.   Original Report Authenticated By: D. Wallace Going, MD  Assessment and Plan:   Lumbar back pain with radiculopathy affecting right lower extremity  >25 minutes spent in face to face time with patient, >50% spent in counselling or coordination of care: She is doing well. Continue to titrate up her Neurontin use and begin activity as tolerated.  Additional time spent in discussion with the patient and listening regarding her multiple other questions regarding sleep, potential ADD, and other issues. I think that this would be better handled by her primary care provider, or I offered to refer her to psychology for ADD testing.  Signed,  Maud Deed. Munirah Doerner, MD   Patient's Medications  New Prescriptions   No medications on file  Previous Medications   AMITRIPTYLINE (ELAVIL) 25 MG TABLET    Take 1 tablet by mouth at  bedtime   CALCIUM-VITAMIN D PO    2 (two) times daily. Take one by mouth daily   DIPHENHYDRAMINE (BENADRYL) 25 MG CAPSULE    Take 25 mg by mouth at bedtime as needed for itching.   FISH OIL-OMEGA-3 FATTY ACIDS 1000 MG CAPSULE    Take one by mouth twice daily   GABAPENTIN (NEURONTIN) 300 MG CAPSULE    Take 1 capsule (300 mg total) by mouth 3 (three) times daily.   GLUCOSAMINE SULFATE PO    Take 2000 mg's by mouth daily   LORATADINE (CLARITIN) 10 MG TABLET    Take 10 mg by mouth daily as needed for allergies.   MELOXICAM (MOBIC) 15 MG TABLET    Take 1 tablet by mouth  daily   METOPROLOL TARTRATE (LOPRESSOR) 25 MG TABLET    Take 1 tablet (25 mg total) by mouth 2 (two) times daily.   MULTIPLE VITAMIN (MULTIVITAMIN) TABLET    Take 1 tablet by mouth daily.   OMEPRAZOLE (PRILOSEC) 40 MG CAPSULE    Take 1 capsule by mouth  every day   PRAVASTATIN (PRAVACHOL) 10 MG TABLET    Take 1 tablet by mouth  daily   PROBIOTIC PRODUCT (ALIGN PO)    Take by mouth daily.    TOPIRAMATE (TOPAMAX) 25 MG TABLET    Take 1 tablet by mouth  daily   TRAMADOL (ULTRAM) 50 MG TABLET    Take 1  tablet (50 mg total) by mouth every 6 (six) hours as needed.  Modified Medications   No medications on file  Discontinued Medications   No medications on file

## 2014-07-26 ENCOUNTER — Encounter: Payer: Self-pay | Admitting: Family Medicine

## 2014-07-27 ENCOUNTER — Other Ambulatory Visit: Payer: Self-pay | Admitting: Family Medicine

## 2014-07-27 NOTE — Telephone Encounter (Signed)
She is to have appt with Dr Lorelei Pont, not Deborra Medina

## 2014-08-21 ENCOUNTER — Other Ambulatory Visit: Payer: Self-pay | Admitting: Family Medicine

## 2014-09-04 ENCOUNTER — Ambulatory Visit (INDEPENDENT_AMBULATORY_CARE_PROVIDER_SITE_OTHER)
Admission: RE | Admit: 2014-09-04 | Discharge: 2014-09-04 | Disposition: A | Discharge status: Home / Self Care | Payer: 59 | Source: Ambulatory Visit | Attending: Family Medicine | Admitting: Family Medicine

## 2014-09-04 ENCOUNTER — Ambulatory Visit (INDEPENDENT_AMBULATORY_CARE_PROVIDER_SITE_OTHER): Payer: 59 | Admitting: Family Medicine

## 2014-09-04 ENCOUNTER — Ambulatory Visit: Payer: 59 | Admitting: Family Medicine

## 2014-09-04 ENCOUNTER — Encounter: Payer: Self-pay | Admitting: Family Medicine

## 2014-09-04 VITALS — BP 120/82 | HR 97 | Temp 98.0°F | Ht 64.0 in | Wt 151.8 lb

## 2014-09-04 DIAGNOSIS — M5417 Radiculopathy, lumbosacral region: Secondary | ICD-10-CM

## 2014-09-04 DIAGNOSIS — M25561 Pain in right knee: Secondary | ICD-10-CM

## 2014-09-04 DIAGNOSIS — M5410 Radiculopathy, site unspecified: Secondary | ICD-10-CM

## 2014-09-04 DIAGNOSIS — M5416 Radiculopathy, lumbar region: Secondary | ICD-10-CM

## 2014-09-04 MED ORDER — GABAPENTIN 300 MG PO CAPS: 1200.0000 mg | ORAL_CAPSULE | Freq: Three times a day (TID) | ORAL | Status: DC

## 2014-09-04 MED ORDER — PREDNISONE 20 MG PO TABS: ORAL_TABLET | ORAL | Status: DC

## 2014-09-04 NOTE — Progress Notes (Signed)
Pre visit review using our clinic review tool, if applicable. No additional management support is needed unless otherwise documented below in the visit note. 

## 2014-09-04 NOTE — Progress Notes (Signed)
Dr. Frederico Hamman T. Mason Burleigh, MD, [facility] Primary [facility] and Sports Medicine [place], [address] Phone: [phone] Fax: [phone]  09/04/2014  Patient: Krystal Walker, MRN: [id number], DOB: [birth year], 59 y.o.  Primary Physician:  Arnette Norris, MD  Chief Complaint: Shoulder Pain and Back Pain   Subjective:   Krystal Walker is a 59 y.o. very pleasant female patient who presents with the following:  The patient comes in with a multitude of complaints.  She was here to follow-up regarding her lumbar radiculopathy, but she has since had a trauma, and she has multiple other complaints now.  Lumbar radiculopathy actually is improving, she has minimal to no symptoms from this.  She is now taking gabapentin 900 mg 3 times a day without any significant side effects. Occasionally, this will still bother her.  Approximately 6 weeks ago, the patient fell on the ice and struck her right knee.  Since then she has continued to have pain.  Intermittently this has swollen, and she has had some pain and has had some limping.  Her primary complaint about this is that it feels as if she is having some stinging sensation on the surface.  She does not complain of anterior pain or joint line pain.  She has not had any locking up her symptomatically giving way in the knee.  She has not tried any additional medications or any bracing.  No prior operative interventions.  She also has another new symptom where she is having some electrical sensation in her arms and fingers bilaterally in the upper extremities.  She also feels as if she is sometimes losing control of her fingers and will tap or move her fingers involuntarily.  At the same time, she is not having any significant numbness, and she is not having any neck pain at all.  She has not had any known trauma where she injured her neck.  She has not had any head trauma when she fell.  Electrical feels like in her fingers and arms and  will involuntarily loose control.   Fell on some ice onto her right knee. Now has stings on her right knee. Right knee trauma.   07/03/2014 Last OV with Owens Loffler, MD  Back is feeling a lot better. Up to 900 at night and AM. 600 mg of gabapentin during the daytime. She is a little bit sleepier during the daytime, but overall she is unable to tolerate this quite a bit. Her back pain is effectively completely gone. She is still having some neuropathic symptoms and some decreased sensation on the right side, but it improved significantly compared to before.  She also brings up another of issues including poor sleep, and potential adult ADD, and difficulty getting work done.  -  05/03/2014 Last OV with Owens Loffler, MD  Had been doing some exercise on the floor at the senior center. Back did not feel right and numbness in calf and foot. Has been ongoing since around 02/2014.   Having some radiating pain on the R. Ladder collapsed on her foot. Had to jump off of the ladder. Assessment certainly did not help.  The patient did do one course of 40 mg of prednisone daily x7 days given by my partner. She still is having some numbness and tingling in the lower extremity distal to the knee. She has not had any prior operative intervention in her spine. She is relatively active, and it does feel a little bit  better when she is stretching her hips and her piriformis region.  Past Medical History, Surgical History, Social History, Family History, Problem List, Medications, and Allergies have been reviewed and updated if relevant.  GEN: no acute illness or fever CV: No chest pain or shortness of breath MSK: detailed above Neuro: neurological signs are described above ROS O/w per HPI  Objective:   Filed Vitals:   09/04/14 0820  BP: 120/82  Pulse: 97  Temp: 98 F (36.7 C)  TempSrc: Oral  Height: 5\' 4"  (1.626 m)  Weight: 151 lb 12.8 oz (68.856 kg)  SpO2: 97%   GEN:  Well-developed,well-nourished,in no acute distress; alert,appropriate and cooperative throughout examination HEENT: Normocephalic and atraumatic without obvious abnormalities. Ears, externally no deformities PULM: Breathing comfortably in no respiratory distress EXT: No clubbing, cyanosis, or edema PSYCH: Normally interactive. Cooperative during the interview. Pleasant. Friendly and conversant. Not anxious or depressed appearing. Normal, full affect.  Range of motion at  the waist: Flexion, extension, lateral bending and rotation:  Fairly normal with minimal limitation  No echymosis or edema Rises to examination table with no difficulty Gait: nonantalgic  Inspection/Deformity: N Paraspinus Tenderness: NT  B Ankle Dorsiflexion (L5,4): 5/5 B Great Toe Dorsiflexion (L5,4): 5/5 Heel Walk (L5): WNL Toe Walk (S1): WNL Rise/Squat (L4): WNL  SENSORY B Medial Foot (L4): WNL B Dorsum (L5): WNL B Lateral (S1): WNL Light Touch: WNL Pinprick: normal  REFLEXES Knee (L4): 2+ Ankle (S1): 2+  B SLR, seated: neg B SLR, supine: neg B FABER: neg B Reverse FABER: neg B Greater Troch: NT B Log Roll: neg B Stork: NT B Sciatic Notch: NT   R KNEE: the patient lacks 2 of extension. Flexion to 125.  Minimally tender on the medial joint line.  The lateral joint line does have some small amount of tenderness.  There is no effusion.  Stable MCL and LCL.  ACL and PCL are also stable.  McMurray's testing and flexion pinch testing are nontender.  Bounce home testing is nontender.  Patellar tendon and patellar nontender.  Remainder of bony exam is nontender and unremarkable.  CERVICAL SPINE EXAM Range of motion: Flexion, extension, lateral bending, and rotation: full Pain with terminal motion: none Spinous Processes: NT SCM: NT Upper paracervical muscles: nt Upper traps: NT C5-T1 intact, sensation and motor   Radiology: Dg Knee Ap/lat W/sunrise Right  09/04/2014   CLINICAL DATA:  Slipped and  fell on right knee 6 weeks ago. Initial evaluation.  EXAM: RIGHT KNEE 3 VIEWS  COMPARISON:  None.  FINDINGS: There is no evidence of fracture, dislocation, or joint effusion. There is no evidence of arthropathy or other focal bone abnormality. Soft tissues are unremarkable.  IMPRESSION: No acute abnormality.   Electronically Signed   By: Marcello Moores  Register   On: 09/04/2014 10:13     *RADIOLOGY REPORT*  Clinical Data: Back pain  LUMBAR SPINE - COMPLETE 4+ VIEW  Comparison: None.  Findings: Tiny anterior endplate spurs [id number]. Mild bilateral facet degenerative hypertrophy L4-5 and L5-S1. There is no evidence of lumbar spine fracture. Alignment is normal. Intervertebral disc spaces are maintained.  IMPRESSION: 1. Negative for fracture or other acute abnormality. 2. Mild degenerative changes as above.   Original Report Authenticated By: D. Wallace Going, MD  Assessment and Plan:   Right knee pain - Plan: DG Knee AP/LAT W/Sunrise Right  Lumbar back pain with radiculopathy affecting right lower extremity  Radiculopathy affecting upper extremity  >40 minutes spent in face to face time with  patient, >50% spent in counselling or coordination of care: lumbar radiculopathy is improving.  The patient is already on high doses of gabapentin, and she is also taking Elavil 25 mg at night.  Continue this.  Upper extremity sensations.  This does not follow a particular nerve pattern from the neck.  I cannot fully explain this.  For now, continue with neuropathic agents, and do a pulse and taper of corticosteroids with follow-up.  Right knee pain.  She does have some joint line tenderness on the medial lateral joint line, and her weightbearing knee films do not show any significant arthritis.  Meniscal pathology cannot be excluded.  Electrical sensations posttrauma could represent RSD.  The patient is already on multiple neuropathic agents, so I would simply continue this and give this  time.  The patient had multiple questions, and I tried to answer all of them.  I cannot fully exclude a systemic condition, but recent changes posttrauma would argue against this.  If she continues to have symptoms at follow-up or worsens, then additional workup would be indicated.  Follow-up: Return in about 2 months (around 11/04/2014).  New Prescriptions   PREDNISONE (DELTASONE) 20 MG TABLET    2 tabs po for 4 days, then 1 po for 4 days   Orders Placed This Encounter  Procedures  . DG Knee AP/LAT W/Sunrise Right    Signed,  Kahlin Mark T. Taiwana Willison, MD   Patient's Medications  New Prescriptions   PREDNISONE (DELTASONE) 20 MG TABLET    2 tabs po for 4 days, then 1 po for 4 days  Previous Medications   AMITRIPTYLINE (ELAVIL) 25 MG TABLET    Take 1 tablet by mouth at  bedtime   CALCIUM-VITAMIN D PO    2 (two) times daily. Take one by mouth daily   FISH OIL-OMEGA-3 FATTY ACIDS 1000 MG CAPSULE    Take one by mouth twice daily   GLUCOSAMINE SULFATE PO    Take 2000 mg's by mouth daily   MELOXICAM (MOBIC) 15 MG TABLET    Take 1 tablet by mouth  daily   METOPROLOL TARTRATE (LOPRESSOR) 25 MG TABLET    Take 1 tablet (25 mg total) by mouth 2 (two) times daily.   MULTIPLE VITAMIN (MULTIVITAMIN) TABLET    Take 1 tablet by mouth daily.   OMEPRAZOLE (PRILOSEC) 40 MG CAPSULE    Take 1 capsule by mouth  every day   PRAVASTATIN (PRAVACHOL) 10 MG TABLET    Take 1 tablet by mouth  daily   PROBIOTIC PRODUCT (ALIGN PO)    Take by mouth daily.    TOPIRAMATE (TOPAMAX) 25 MG TABLET    Take 1 tablet by mouth  daily   TRAMADOL (ULTRAM) 50 MG TABLET    Take 1 tablet (50 mg total) by mouth every 6 (six) hours as needed.  Modified Medications   Modified Medication Previous Medication   GABAPENTIN (NEURONTIN) 300 MG CAPSULE gabapentin (NEURONTIN) 300 MG capsule      Take 4 capsules (1,200 mg total) by mouth 3 (three) times daily.    Take 1 capsule by mouth 3  times daily  Discontinued Medications   No  medications on file

## 2014-10-19 ENCOUNTER — Telehealth: Payer: Self-pay | Admitting: *Deleted

## 2014-10-19 NOTE — Telephone Encounter (Signed)
Received fax from [facility] requesting prior authorization for Gabapentin 300 mg.  Form completed and faxed to [facility] at [phone].

## 2014-10-19 NOTE — Telephone Encounter (Signed)
Received fax from [facility] that states Gabpentin is on the plan's list of coverage drugs.  The member will be able to fill a prescription for this medication at their pharmacy..Notfication faxed to [facility].

## 2014-11-13 ENCOUNTER — Encounter: Payer: Self-pay | Admitting: Family Medicine

## 2014-11-13 ENCOUNTER — Ambulatory Visit (INDEPENDENT_AMBULATORY_CARE_PROVIDER_SITE_OTHER): Payer: 59 | Admitting: Family Medicine

## 2014-11-13 ENCOUNTER — Ambulatory Visit: Payer: 59 | Admitting: Family Medicine

## 2014-11-13 VITALS — BP 118/78 | HR 88 | Temp 98.4°F | Ht 64.0 in | Wt 154.8 lb

## 2014-11-13 DIAGNOSIS — R635 Abnormal weight gain: Secondary | ICD-10-CM

## 2014-11-13 DIAGNOSIS — M5417 Radiculopathy, lumbosacral region: Secondary | ICD-10-CM | POA: Diagnosis not present

## 2014-11-13 DIAGNOSIS — I10 Essential (primary) hypertension: Secondary | ICD-10-CM

## 2014-11-13 DIAGNOSIS — M5410 Radiculopathy, site unspecified: Secondary | ICD-10-CM | POA: Diagnosis not present

## 2014-11-13 DIAGNOSIS — M5416 Radiculopathy, lumbar region: Secondary | ICD-10-CM

## 2014-11-13 DIAGNOSIS — M25561 Pain in right knee: Secondary | ICD-10-CM

## 2014-11-13 NOTE — Progress Notes (Signed)
Dr. Frederico Hamman T. Chalee Hirota, MD, [facility] [place], [address] Phone: [phone] Fax: [phone]  11/13/2014  Patient: Krystal Walker, MRN: [id number], DOB: 1955-07-22, 59 y.o.  Primary Physician:  Arnette Norris, MD  Chief Complaint: Follow-up   Subjective:   Krystal Walker is a 59 y.o. very pleasant female patient who presents with the following:  Knee and other sensations:  Patient's right knee pain as described below is now completely better.  Doing much better, sometimes - and will fall asleep sometimes. Taking gabapentin 900 mg TID.  Elavil 25 mg at night. Still having some difficulty at night.   she has also gained about 20 pounds in the last 2 years.  R knee is feeling better.   Some neck popping.  She is worried about the neck popping that she is having any recently, she is also having some right-sided radiculopathy in the cervical region as well.  Wt Readings from Last 3 Encounters:  11/13/14 154 lb 12 oz (70.194 kg)  09/04/14 151 lb 12.8 oz (68.856 kg)  07/03/14 150 lb 8 oz (68.266 kg)     09/04/2014 Last OV with Owens Loffler, MD  The patient comes in with a multitude of complaints.  She was here to follow-up regarding her lumbar radiculopathy, but she has since had a trauma, and she has multiple other complaints now.  Lumbar radiculopathy actually is improving, she has minimal to no symptoms from this.  She is now taking gabapentin 900 mg 3 times a day without any significant side effects. Occasionally, this will still bother her.  Approximately 6 weeks ago, the patient fell on the ice and struck her right knee.  Since then she has continued to have pain.  Intermittently this has swollen, and she has had some pain and has had some limping.  Her primary complaint about this is that it feels as if she is having some stinging sensation on the surface.  She does not complain of anterior pain or joint line  pain.  She has not had any locking up her symptomatically giving way in the knee.  She has not tried any additional medications or any bracing.  No prior operative interventions.  She also has another new symptom where she is having some electrical sensation in her arms and fingers bilaterally in the upper extremities.  She also feels as if she is sometimes losing control of her fingers and will tap or move her fingers involuntarily.  At the same time, she is not having any significant numbness, and she is not having any neck pain at all.  She has not had any known trauma where she injured her neck.  She has not had any head trauma when she fell.  Electrical feels like in her fingers and arms and will involuntarily loose control.   Fell on some ice onto her right knee. Now has stings on her right knee. Right knee trauma.   07/03/2014 Last OV with Owens Loffler, MD  Back is feeling a lot better. Up to 900 at night and AM. 600 mg of gabapentin during the daytime. She is a little bit sleepier during the daytime, but overall she is unable to tolerate this quite a bit. Her back pain is effectively completely gone. She is still having some neuropathic symptoms and some decreased sensation on the right side, but it improved significantly compared to before.  She also brings up another of issues  including poor sleep, and potential adult ADD, and difficulty getting work done.  -  05/03/2014 Last OV with Owens Loffler, MD  Had been doing some [facility] the [facility]. Back did not feel right and numbness in calf and foot. Has been ongoing since around 02/2014.   Having some radiating pain on the R. Ladder collapsed on her foot. Had to jump off of the ladder. Assessment certainly did not help.  The patient did do one course of 40 mg of prednisone daily x7 days given by my partner. She still is having some numbness and tingling in the lower extremity distal to the knee. She has not had  any prior operative intervention in her spine. She is relatively active, and it does feel a little bit better when she is stretching her hips and her piriformis region.  Past Medical History, Surgical History, Social History, Family History, Problem List, Medications, and Allergies have been reviewed and updated if relevant.  GEN: no acute illness or fever CV: No chest pain or shortness of breath MSK: detailed above Neuro: neurological signs are described above ROS O/w per HPI  Objective:   Filed Vitals:   11/13/14 0944  BP: 118/78  Pulse: 88  Temp: 98.4 F (36.9 C)  TempSrc: Oral  Height: 5\' 4"  (1.626 m)  Weight: 154 lb 12 oz (70.194 kg)   GEN: Well-developed,well-nourished,in no acute distress; alert,appropriate and cooperative throughout examination HEENT: Normocephalic and atraumatic without obvious abnormalities. Ears, externally no deformities PULM: Breathing comfortably in no respiratory distress EXT: No clubbing, cyanosis, or edema PSYCH: Normally interactive. Cooperative during the interview. Pleasant. Friendly and conversant. Not anxious or depressed appearing. Normal, full affect.  Range of motion at  the waist: Flexion, extension, lateral bending and rotation:  Fairly normal with minimal limitation  No echymosis or edema Rises to examination table with no difficulty Gait: nonantalgic  Inspection/Deformity: N Paraspinus Tenderness: NT  B Ankle Dorsiflexion (L5,4): 5/5 B Great Toe Dorsiflexion (L5,4): 5/5 Heel Walk (L5): WNL Toe Walk (S1): WNL Rise/Squat (L4): WNL  SENSORY B Medial Foot (L4): WNL B Dorsum (L5): WNL B Lateral (S1): WNL Light Touch: WNL Pinprick: normal  REFLEXES Knee (L4): 2+ Ankle (S1): 2+  B SLR, seated: neg B SLR, supine: neg B FABER: neg B Reverse FABER: neg B Greater Troch: NT B Log Roll: neg B Stork: NT B Sciatic Notch: NT   R KNEE: the patient lacks 2 of extension. Flexion to 125.  Minimally tender on the medial joint  line.  The lateral joint line does have some small amount of tenderness.  There is no effusion.  Stable MCL and LCL.  ACL and PCL are also stable.  McMurray's testing and flexion pinch testing are nontender.  Bounce home testing is nontender.  Patellar tendon and patellar nontender.  Remainder of bony exam is nontender and unremarkable.  CERVICAL SPINE EXAM Range of motion: Flexion, extension, lateral bending, and rotation: full Pain with terminal motion: none Spinous Processes: NT SCM: NT Upper paracervical muscles: nt Upper traps: NT C5-T1 intact, sensation and motor   Radiology: No results found.   *RADIOLOGY REPORT*  Clinical Data: Back pain  LUMBAR SPINE - COMPLETE 4+ VIEW  Comparison: None.  Findings: Tiny anterior endplate spurs S14-E3. Mild bilateral facet degenerative hypertrophy L4-5 and L5-S1. There is no evidence of lumbar spine fracture. Alignment is normal. Intervertebral disc spaces are maintained.  IMPRESSION: 1. Negative for fracture or other acute abnormality. 2. Mild degenerative changes  as above.   Original Report Authenticated By: D. Wallace Going, MD  CLINICAL DATA:  Slipped and fell on right knee 6 weeks ago. Initial evaluation.   EXAM: RIGHT KNEE 3 VIEWS   COMPARISON:  None.   FINDINGS: There is no evidence of fracture, dislocation, or joint effusion. There is no evidence of arthropathy or other focal bone abnormality. Soft tissues are unremarkable.   IMPRESSION: No acute abnormality.     Electronically Signed   By: Marcello Moores  Register   On: 09/04/2014 10:13   Assessment and Plan:   Right knee pain  Lumbar back pain with radiculopathy affecting right lower extremity  Radiculopathy affecting upper extremity  Essential hypertension  Weight gain  >25 minutes spent in face to face time with patient, >50% spent in counselling or coordination of care  Refer to the patient instructions sections for details of plan shared  with patient.    back and knee pain are doing quite a bit better.   she is having some cervical radiculopathy. Recommended McKenzie protocol cervical spine.   she actually stopped all of her blood pressure medication, and she was confused about this. She ended up email me later, and I recommended that she return to using her Lopressor as directed. Follow-up with PCP in this regard.   she is also been gaining some weight, I am most suspicious that this is from amitriptyline. It could also be from gabapentin. Initially we will stop her amitriptyline. Over time if she does well, that she may be able to get off of her gabapentin entirely.  Patient Instructions  Stop the Amitryptiline  Be sure to take Metoprolol Tartrate (Lopressor) 1 tablet twice a day     Follow-up: Return if symptoms worsen or fail to improve.  New Prescriptions   No medications on file   No orders of the defined types were placed in this encounter.    Signed,  Maud Deed. Tyrece Vanterpool, MD   Patient's Medications  New Prescriptions   No medications on file  Previous Medications   CALCIUM-VITAMIN D PO    2 (two) times daily. Take one by mouth daily   FISH OIL-OMEGA-3 FATTY ACIDS 1000 MG CAPSULE    Take one by mouth twice daily   GABAPENTIN (NEURONTIN) 300 MG CAPSULE    Take 4 capsules (1,200 mg total) by mouth 3 (three) times daily.   GLUCOSAMINE SULFATE PO    Take 2000 mg's by mouth daily   MELOXICAM (MOBIC) 15 MG TABLET    Take 1 tablet by mouth  daily   METOPROLOL TARTRATE (LOPRESSOR) 25 MG TABLET    Take 1 tablet (25 mg total) by mouth 2 (two) times daily.   MULTIPLE VITAMIN (MULTIVITAMIN) TABLET    Take 1 tablet by mouth daily.   OMEPRAZOLE (PRILOSEC) 40 MG CAPSULE    Take 1 capsule by mouth  every day   PRAVASTATIN (PRAVACHOL) 10 MG TABLET    Take 1 tablet by mouth  daily   PROBIOTIC PRODUCT (ALIGN PO)    Take by mouth daily.    TOPIRAMATE (TOPAMAX) 25 MG TABLET    Take 1 tablet by mouth  daily   TRAMADOL  (ULTRAM) 50 MG TABLET    Take 1 tablet (50 mg total) by mouth every 6 (six) hours as needed.  Modified Medications   No medications on file  Discontinued Medications   AMITRIPTYLINE (ELAVIL) 25 MG TABLET    Take 1 tablet by mouth at  bedtime   PREDNISONE (  DELTASONE) 20 MG TABLET    2 tabs po for 4 days, then 1 po for 4 days

## 2014-11-13 NOTE — Progress Notes (Signed)
Pre visit review using our clinic review tool, if applicable. No additional management support is needed unless otherwise documented below in the visit note. 

## 2014-11-13 NOTE — Telephone Encounter (Signed)
I would like to involve Dr. Deborra Medina with these decisions.   I have been following for knee, back, neck, which have overall improved.   Electronically Signed  By: Owens Loffler, MD On: 11/13/2014 1:42 PM

## 2014-11-13 NOTE — Patient Instructions (Addendum)
Stop the Amitryptiline  Be sure to take Metoprolol Tartrate (Lopressor) 1 tablet twice a day

## 2014-11-24 ENCOUNTER — Encounter: Payer: Self-pay | Admitting: *Deleted

## 2014-11-26 ENCOUNTER — Encounter: Payer: Self-pay | Admitting: Family Medicine

## 2014-11-30 ENCOUNTER — Other Ambulatory Visit: Payer: Self-pay | Admitting: Family Medicine

## 2014-11-30 DIAGNOSIS — Z1231 Encounter for screening mammogram for malignant neoplasm of breast: Secondary | ICD-10-CM

## 2014-12-04 ENCOUNTER — Ambulatory Visit
Admission: RE | Admit: 2014-12-04 | Discharge: 2014-12-04 | Disposition: A | Discharge status: Home / Self Care | Payer: 59 | Source: Ambulatory Visit | Attending: Family Medicine | Admitting: Family Medicine

## 2014-12-04 DIAGNOSIS — Z1231 Encounter for screening mammogram for malignant neoplasm of breast: Secondary | ICD-10-CM | POA: Insufficient documentation

## 2014-12-18 ENCOUNTER — Encounter: Payer: Self-pay | Admitting: Internal Medicine

## 2014-12-18 ENCOUNTER — Ambulatory Visit (INDEPENDENT_AMBULATORY_CARE_PROVIDER_SITE_OTHER)
Admission: RE | Admit: 2014-12-18 | Discharge: 2014-12-18 | Disposition: A | Discharge status: Home / Self Care | Payer: 59 | Source: Ambulatory Visit | Attending: Internal Medicine | Admitting: Internal Medicine

## 2014-12-18 ENCOUNTER — Ambulatory Visit (INDEPENDENT_AMBULATORY_CARE_PROVIDER_SITE_OTHER): Payer: 59 | Admitting: Internal Medicine

## 2014-12-18 VITALS — BP 142/86 | HR 84 | Temp 98.4°F | Wt 154.0 lb

## 2014-12-18 DIAGNOSIS — M25531 Pain in right wrist: Secondary | ICD-10-CM

## 2014-12-18 DIAGNOSIS — M25431 Effusion, right wrist: Secondary | ICD-10-CM

## 2014-12-18 NOTE — Progress Notes (Signed)
Pre visit review using our clinic review tool, if applicable. No additional management support is needed unless otherwise documented below in the visit note. 

## 2014-12-18 NOTE — Patient Instructions (Signed)
Wrist Exercises RANGE OF MOTION (ROM) AND STRETCHING EXERCISES - Wrist Sprain  These exercises may help you when beginning to rehabilitate your injury. Your symptoms may resolve with or without further involvement from your physician, physical therapist, or athletic trainer. While completing these exercises, remember:   Restoring tissue flexibility helps normal motion to return to the joints. This allows healthier, less painful movement and activity.  An effective stretch should be held for at least 30 seconds.  A stretch should never be painful. You should only feel a gentle lengthening or release in the stretched tissue. RANGE OF MOTION - Wrist Flexion, Active-Assisted  Extend your right / left elbow with your palm pointing down.*  Gently pull the back of your hand toward you until you feel a gentle stretch on the top of your forearm.  Hold this position for __________ seconds. Repeat __________ times. Complete this exercise __________ times per day.  *If directed by your physician, physical therapist, or athletic trainer, complete this stretch with your elbow bent rather than extended. RANGE OF MOTION - Wrist Extension, Active-Assisted   Extend your right / left elbow and turn your palm upward.*  Gently pull your palm/fingertips back so your wrist extends and your fingers point more toward the ground.  You should feel a gentle stretch on the inside of your forearm.  Hold this position for __________ seconds. Repeat __________ times. Complete this exercise __________ times per day. *If directed by your physician, physical therapist, or athletic trainer, complete this stretch with your elbow bent, rather than extended. RANGE OF MOTION - Supination, Active   Stand or sit with your elbows at your side. Bend your right / left elbow to 90 degrees.  Turn your palm upward until you feel a gentle stretch on the inside of your forearm.  Hold this position for __________ seconds. Slowly  release and return to the starting position. Repeat __________ times. Complete this stretch __________ times per day.  RANGE OF MOTION - Pronation, Active   Stand or sit with your elbows at your side. Bend your right / left elbow to 90 degrees.  Turn your palm downward until you feel a gentle stretch on the top of your forearm.  Hold this position for __________ seconds. Slowly release and return to the starting position. Repeat __________ times. Complete this stretch __________ times per day.  STRENGTHENING EXERCISES  These exercises may help you when beginning to rehabilitate your injury. They may resolve your symptoms with or without further involvement from your physician, physical therapist, or athletic trainer. While completing these exercises, remember:   Muscles can gain both the endurance and the strength needed for everyday activities through controlled exercises.  Complete these exercises as instructed by your physician, physical therapist, or athletic trainer. Progress the resistance and repetitions only as guided.  You may experience muscle soreness or fatigue, but the pain or discomfort you are trying to eliminate should never worsen during these exercises. If this pain does worsen, stop and make certain you are following the directions exactly. If the pain is still present after adjustments, discontinue the exercise until you can discuss the trouble with your clinician. STRENGTH - Wrist Flexors  Sit with your right / left forearm palm-up and fully supported. Your elbow should be resting below the height of your shoulder. Allow your wrist to extend over the edge of the surface.  Loosely holding a __________ weight or a piece of rubber exercise band/tubing, slowly curl your hand up toward your forearm.  Hold this position for __________ seconds. Slowly lower the wrist back to the starting position in a controlled manner. Repeat __________ times. Complete this exercise __________  times per day.  STRENGTH - Wrist Extensors  Sit with your right / left forearm palm-down and fully supported. Your elbow should be resting below the height of your shoulder. Allow your wrist to extend over the edge of the surface.  Loosely holding a __________ weight or a piece of rubber exercise band/tubing, slowly curl your hand up toward your forearm.  Hold this position for __________ seconds. Slowly lower the wrist back to the starting position in a controlled manner. Repeat __________ times. Complete this exercise __________ times per day.  STRENGTH - Forearm Supinators  Sit with your right / left forearm supported on a table, keeping your elbow below shoulder height. Rest your hand over the edge, palm down.  Gently grip a hammer or a soup ladle.  Without moving your elbow, slowly turn your palm and hand upward to a "thumbs-up" position.  Hold this position for __________ seconds. Slowly return to the starting position. Repeat __________ times. Complete this exercise __________ times per day.  STRENGTH - Forearm Pronators   Sit with your right / left forearm supported on a table, keeping your elbow below shoulder height. Rest your hand over the edge, palm up.  Gently grip a hammer or a soup ladle.  Without moving your elbow, slowly turn your palm and hand upward to a "thumbs-up" position.  Hold this position for __________ seconds. Slowly return to the starting position. Repeat __________ times. Complete this exercise __________ times per day.  STRENGTH - Grip  Grasp a tennis ball, a dense sponge, or a large, rolled sock in your hand.  Squeeze as hard as you can without increasing any pain.  Hold this position for __________ seconds. Release your grip slowly. Repeat __________ times. Complete this exercise __________ times per day.  Document Released: 04/09/2005 Document Revised: 10/10/2013 Document Reviewed: 09/07/2008 [facility] Patient Information 2015 [place], [place].  This information is not intended to replace advice given to you by your health care provider. Make sure you discuss any questions you have with your health care provider.

## 2014-12-18 NOTE — Progress Notes (Signed)
Subjective:    Patient ID: Krystal Walker, female    DOB: [birth year], 59 y.o.   MRN: [id number]  HPI  Pt presents to the clinic today with c/o right wrist pain. This started 1-2 months ago but seems to be getting worse. She has noticed some swelling but has not noticed any bruising. She reports she has numbness and weakness in the bottom of her hand and thumb. She has tried Tylenol without any relief. She does use her hand repetitive motions with her hand. She denies injury to the area.  Review of Systems      Past Medical History  Diagnosis Date  . Hypertension   . Hyperlipidemia   . Diabetes mellitus   . Depression   . IBS (irritable bowel syndrome)   . Osteoporosis   . Raynaud disease   . GERD (gastroesophageal reflux disease)   . Raynaud phenomenon 1982  . Personal history of tobacco use, presenting hazards to health   . Breast screening, unspecified   . Mammographic microcalcification   . Special screening for malignant neoplasms, colon   . Bowel trouble 1997  . Radiation     for adenoid cystic carcinoma  . 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 CALCIUM-VITAMIN D PO 2 (two) times daily. Take one by mouth daily    . fish oil-omega-3 fatty acids 1000 MG capsule Take one by mouth twice daily    . gabapentin (NEURONTIN) 300 MG capsule Take 4 capsules (1,200 mg total) by mouth 3 (three) times daily. 360 capsule 2  . GLUCOSAMINE SULFATE PO Take 2000 mg's by mouth daily    . meloxicam (MOBIC) 15 MG tablet Take 1 tablet by mouth  daily 90 tablet 1  . metoprolol tartrate (LOPRESSOR) 25 MG tablet Take 1 tablet (25 mg total) by mouth 2 (two) times daily. (Patient taking differently: Take 25 mg by mouth daily. ) 60 tablet 6  . Multiple Vitamin (MULTIVITAMIN) tablet Take 1 tablet by mouth daily.    Marland Kitchen omeprazole (PRILOSEC) 40 MG capsule Take 1 capsule by mouth  every day 90 capsule 3  .  pravastatin (PRAVACHOL) 10 MG tablet Take 1 tablet by mouth  daily 90 tablet 1  . Probiotic Product (ALIGN PO) Take by mouth daily.     Marland Kitchen topiramate (TOPAMAX) 25 MG tablet Take 1 tablet by mouth  daily 90 tablet 1  . traMADol (ULTRAM) 50 MG tablet Take 1 tablet (50 mg total) by mouth every 6 (six) hours as needed. 40 tablet 3   No current facility-administered medications for this visit.    Allergies  Allergen Reactions  . Latex   . Morphine And Related Nausea And Vomiting    Family History  Problem Relation Age of Onset  . Arrhythmia Mother     pacemaker  . Fainting Mother   . Hypertension Mother   . Hyperlipidemia Mother     History   Social History  . Marital Status: Single    Spouse Name: N/A  . Number of Children: N/A  . Years of Education: N/A   Occupational History  . Not on file.   Social History Main Topics  . Smoking status: Former Smoker -- 0.50 packs/day for 15 years    Types: Cigarettes  . Smokeless tobacco: Never Used     Comment: quit 1986  . Alcohol Use: No  . Drug Use: No  .  Sexual Activity: Not on file   Other Topics Concern  . Not on file   Social History Narrative     Constitutional: Denies fever, malaise, fatigue, headache or abrupt weight changes.  Respiratory: Denies difficulty breathing, shortness of breath, cough or sputum production.   Cardiovascular: Denies chest pain, chest tightness, palpitations or swelling in the hands or feet.  Musculoskeletal: Pt reports wrist pain. Denies decrease in range of motion, difficulty with gait, muscle pain.  Neurological: Pt reports numbness in hand. Denies dizziness, difficulty with memory, difficulty with speech or problems with balance and coordination.   No other specific complaints in a complete review of systems (except as listed in HPI above).   Objective:   Physical Exam  BP 142/86 mmHg  Pulse 84  Temp(Src) 98.4 F (36.9 C) (Oral)  Wt 154 lb (69.854 kg)  SpO2 98% Wt Readings from  Last 3 Encounters:  12/18/14 154 lb (69.854 kg)  11/13/14 154 lb 12 oz (70.194 kg)  09/04/14 151 lb 12.8 oz (68.856 kg)    General: Appears her stated age, well developed, well nourished in NAD. Skin: Warm, dry and intact. No rashes, lesions or ulcerations noted. Cardiovascular: Normal rate and rhythm. S1,S2 noted.  No murmur, rubs or gallops noted.  Pulmonary/Chest: Normal effort and positive vesicular breath sounds. No respiratory distress. No wheezes, rales or ronchi noted.  Musculoskeletal: Normal extension and rotation. Decreased flexion of the right wrist. Some swelling noted. Normal ROM of the fingers. Grip strength equal. Neurological: Alert and oriented. Sensation intact to bilateral hands. Phalen and Tinels mildly positive.  BMET    Component Value Date/Time   NA 141 07/04/2013 0932   K 4.1 07/04/2013 0932   CL 103 07/04/2013 0932   CO2 24 07/04/2013 0932   GLUCOSE 89 07/04/2013 0932   BUN 21 07/04/2013 0932   CREATININE 0.96 07/04/2013 0932   CALCIUM 9.5 07/04/2013 0932   GFRNONAA 65 07/04/2013 0932   GFRAA 75 07/04/2013 0932    Lipid Panel     Component Value Date/Time   CHOL 189 07/04/2013 0932   TRIG 65 07/04/2013 0932   HDL 74 07/04/2013 0932   LDLCALC 102* 07/04/2013 0932    CBC    Component Value Date/Time   WBC 3.3* 05/31/2013 0917   RBC 3.54* 05/31/2013 0917   HGB 11.4 05/31/2013 0917   HCT 33.0* 05/31/2013 0917   PLT 180 05/31/2013 0917   MCV 93 05/31/2013 0917   MCH 32.2 05/31/2013 0917   MCHC 34.5 05/31/2013 0917   RDW 12.7 05/31/2013 0917    Hgb A1C No results found for: HGBA1C       Assessment & Plan:   Right wrist pain and swelling:  Will check xray of right wrist Continue Meloxicam daily Continue Tylenol as needed If xray is normal, will refer to neurology for EMG Wear your cock up brace during the day while at work  RTC as needed or if symptoms persist or worsen

## 2014-12-27 ENCOUNTER — Telehealth: Payer: Self-pay | Admitting: Family Medicine

## 2014-12-27 NOTE — Telephone Encounter (Signed)
Pt states that she got a call, no message,. Pt is reqeusting a call back.  She will not have access to her phone until lunch, but doesn't know what time that will be.

## 2014-12-27 NOTE — Telephone Encounter (Signed)
VM did not allow me to lmovm--please refer to result note

## 2014-12-29 ENCOUNTER — Encounter: Payer: Self-pay | Admitting: Internal Medicine

## 2014-12-29 ENCOUNTER — Encounter: Payer: Self-pay | Admitting: Family Medicine

## 2014-12-31 MED ORDER — GABAPENTIN 300 MG PO CAPS: 1200.0000 mg | ORAL_CAPSULE | Freq: Three times a day (TID) | ORAL | Status: DC

## 2014-12-31 NOTE — Addendum Note (Signed)
Addended by: Carter Kitten on: 12/31/2014 08:31 PM   Modules accepted: Orders

## 2015-01-01 MED ORDER — GABAPENTIN 300 MG PO CAPS: 1200.0000 mg | ORAL_CAPSULE | Freq: Three times a day (TID) | ORAL | Status: DC

## 2015-01-01 NOTE — Addendum Note (Signed)
Addended by: Carter Kitten on: 01/01/2015 08:26 AM   Modules accepted: Orders

## 2015-01-01 NOTE — Telephone Encounter (Signed)
Keep getting refill error stating prescription did not go through electronically.  Resent again.  If it doesn't go through this time, I will print to fax.

## 2015-01-25 ENCOUNTER — Encounter: Payer: Self-pay | Admitting: Family Medicine

## 2015-01-25 ENCOUNTER — Ambulatory Visit (INDEPENDENT_AMBULATORY_CARE_PROVIDER_SITE_OTHER): Payer: 59 | Admitting: Family Medicine

## 2015-01-25 VITALS — BP 130/82 | HR 80 | Temp 98.6°F | Ht 64.0 in | Wt 154.2 lb

## 2015-01-25 DIAGNOSIS — M654 Radial styloid tenosynovitis [de Quervain]: Secondary | ICD-10-CM

## 2015-01-25 NOTE — Patient Instructions (Signed)
DeQuairvain's Tenosynovitis  Wear Thumb Spica Splint for 3 weeks: wear all the time. (OK to take off to shower)  Take antiinflammatories scheduled right now. At least once a day, massage the area above your thumb with an ice cube for 10-20 minutes.  After 3 weeks, wean out of your splint. (Wear less and less each day)  Get a STRESS ball, practice squeezing it. Use a RUBBER BAND, practice opening it with your fingers and thumb.  

## 2015-01-25 NOTE — Progress Notes (Signed)
Pre visit review using our clinic review tool, if applicable. No additional management support is needed unless otherwise documented below in the visit note. 

## 2015-01-25 NOTE — Progress Notes (Signed)
Dr. Frederico Hamman T. Shealynn Saulnier, MD, [facility] Medicine [place], [address] Phone: [phone] Fax: [phone]  01/25/2015  Patient: Krystal Walker, MRN: [id number], DOB: September 03, 1955, 59 y.o.  Primary Physician:  Arnette Norris, MD  Chief Complaint: Wrist Pain  Subjective:   Krystal Walker is a 59 y.o. very pleasant female patient who presents with the following:  12/18/2014 OV with Mrs. Garnette Gunner, FNP reviewed.   The patient reports to me that she has had ongoing wrist pain for approximately 2 or 3 months.  She describes a specific incident that occurred at work to me where her wrist was moved in an unusual way and she felt some significant pain at that time acutely.  She didn't fall directly on her wrists.  She has not had any bruising.  There is a mild amount of some swelling more dorsally as well as radially.  She has been in a plane cockup wrist splint for approximately a month. She is also taking some anti-inflammatories and Tylenol.  At this point she does not think she is really any better at all.  She uses her hands repetitively all the time at work.  Basic repetitive hand motions at home are also causing discomfort now. Wrist pain - burning and pain and painful to brush teeth, unscrewing a jar.   States that she stretched it too far at work. Never had pain before then - progressed since then.   Past Medical History, Surgical History, Social History, Family History, Problem List, Medications, and Allergies have been reviewed and updated if relevant.  Patient Active Problem List   Diagnosis Date Noted  . Lumbar back pain with radiculopathy affecting right lower extremity 04/24/2014  . Near syncope 06/13/2013  . Palpitations 06/13/2013  . Abnormal mammogram with microcalcification 09/07/2012  . Fibrocystic disease of breast 09/07/2012  . Raynaud phenomenon 04/12/2012  . IBS (irritable bowel syndrome) 04/12/2012  . Hypertension   .  Hyperlipidemia   . Depression   . GERD (gastroesophageal reflux disease)     Past Medical History  Diagnosis Date  . Hypertension   . Hyperlipidemia   . Diabetes mellitus   . Depression   . IBS (irritable bowel syndrome)   . Osteoporosis   . Raynaud disease   . GERD (gastroesophageal reflux disease)   . Raynaud phenomenon 1982  . Personal history of tobacco use, presenting hazards to health   . Breast screening, unspecified   . Mammographic microcalcification   . Special screening for malignant neoplasms, colon   . Bowel trouble 1997  . Radiation     for adenoid cystic carcinoma  . 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 Abdominal hysterectomy  2004    partial  . Salivary gland surgery  1999  . Colonoscopy  2010  . Parotidectomy Right     adenoid cystic carcinoma  . Breast biopsy Left 02-23-12    left breast stereotactic biopsy; benign    Social History   Social History  . Marital Status: Single    Spouse Name: N/A  . Number of Children: N/A  . Years of Education: N/A   Occupational History  . Not on file.   Social History Main Topics  . Smoking status: Former Smoker -- 0.50 packs/day for 15 years    Types: Cigarettes  . Smokeless tobacco: Never Used  Comment: quit 1986  . Alcohol Use: No  . Drug Use: No  . Sexual Activity: Not on file   Other Topics Concern  . Not on file   Social History Narrative    Family History  Problem Relation Age of Onset  . Arrhythmia Mother     pacemaker  . Fainting Mother   . Hypertension Mother   . Hyperlipidemia Mother     Allergies  Allergen Reactions  . Latex   . Morphine And Related Nausea And Vomiting    Medication list reviewed and updated in full in [facility].  GEN: No fevers, chills. Nontoxic. Primarily MSK c/o today. MSK: Detailed in the HPI GI: tolerating PO intake without difficulty Neuro: No numbness,  parasthesias, or tingling associated. Otherwise the pertinent positives of the ROS are noted above.   Objective:   BP 130/82 mmHg  Pulse 80  Temp(Src) 98.6 F (37 C) (Oral)  Ht 5\' 4"  (1.626 m)  Wt 154 lb 4 oz (69.967 kg)  BMI 26.46 kg/m2   GEN: WDWN, NAD, Non-toxic, Alert & Oriented x 3 HEENT: Atraumatic, Normocephalic.  Ears and Nose: No external deformity. EXTR: No clubbing/cyanosis/edema NEURO: Normal gait.  PSYCH: Normally interactive. Conversant. Not depressed or anxious appearing.  Calm demeanor.   Hand: R Ecchymosis or edema: mild dorsally ROM wrist/hand/digits/elbow: full  Carpals, MCP's, digits: NT Distal Ulna and Radius: NT Supination lift test: neg Ecchymosis or edema: neg Cysts/nodules: neg Finkelstein's test: MARKEDLY POSITIVE Snuffbox tenderness: neg Scaphoid tubercle: NT Hook of Hamate: NT Resisted supination: NT Full composite fist Grip, all digits: 5/5 str No tenosynovitis Axial load test: neg Phalen's: neg Tinel's: neg Atrophy: neg  Hand sensation: intact   Radiology: No results found.   Assessment and Plan:   De Quervain's tenosynovitis, right  By history, it is certainly possible that the patient had a acute injury initially.  At this point, her hand and wrist behave as if she has classical de Quervain's tenosynovitis.  It seems to involve the first dorsal compartment as expected, and it may involve the second dorsal compartment to a lesser degree.  Classically, these are seen with overuse.  We reviewed the hand anatomy in [facility] [employer] and reviewed the components involved.  Place the thumb in a thumb spica splint, d/c cock-up wrist splint. If continues to do poorly, may need to inject the 1st dorsal sheath   Patient Instructions  DeQuairvain's Tenosynovitis  Wear Thumb Spica Splint for 3 weeks: wear all the time. (OK to take off to shower)  Take antiinflammatories scheduled right now. At least once a day, massage the  area above your thumb with an ice cube for 10-20 minutes.  After 3 weeks, wean out of your splint. (Wear less and less each day)  Get a STRESS ball, practice squeezing it. Use a RUBBER BAND, practice opening it with your fingers and thumb.      Follow-up: Return in about 1 month (around 02/25/2015).  Signed,  Maud Deed. Xitlally Mooneyham, MD   Patient's Medications  New Prescriptions   No medications on file  Previous Medications   AMITRIPTYLINE (ELAVIL) 25 MG TABLET    Take 25 mg by mouth at bedtime.   CALCIUM-VITAMIN D PO    2 (two) times daily. Take one by mouth daily   FISH OIL-OMEGA-3 FATTY ACIDS 1000 MG CAPSULE    Take one by mouth twice daily   GABAPENTIN (NEURONTIN) 300 MG CAPSULE    Take 4 capsules (1,200 mg total)  by mouth 3 (three) times daily.   GLUCOSAMINE SULFATE PO    Take 2000 mg's by mouth daily   MELOXICAM (MOBIC) 15 MG TABLET    Take 1 tablet by mouth  daily   METOPROLOL TARTRATE (LOPRESSOR) 25 MG TABLET    Take 1 tablet (25 mg total) by mouth 2 (two) times daily.   MULTIPLE VITAMIN (MULTIVITAMIN) TABLET    Take 1 tablet by mouth daily.   OMEPRAZOLE (PRILOSEC) 40 MG CAPSULE    Take 1 capsule by mouth  every day   PRAVASTATIN (PRAVACHOL) 10 MG TABLET    Take 1 tablet by mouth  daily   PROBIOTIC PRODUCT (ALIGN PO)    Take by mouth daily.    TOPIRAMATE (TOPAMAX) 25 MG TABLET    Take 1 tablet by mouth  daily   TRAMADOL (ULTRAM) 50 MG TABLET    Take 1 tablet (50 mg total) by mouth every 6 (six) hours as needed.  Modified Medications   No medications on file  Discontinued Medications   No medications on file

## 2015-02-14 ENCOUNTER — Other Ambulatory Visit: Payer: Self-pay | Admitting: Family Medicine

## 2015-02-19 ENCOUNTER — Other Ambulatory Visit: Payer: Self-pay | Admitting: Family Medicine

## 2015-02-19 NOTE — Telephone Encounter (Signed)
Last office visit 01/25/2015 with Dr. Lorelei Pont.  Last refilled 01/01/2015 for #1080 capsules with no refills.  Ok to refill?

## 2015-02-26 ENCOUNTER — Ambulatory Visit (INDEPENDENT_AMBULATORY_CARE_PROVIDER_SITE_OTHER): Payer: 59 | Admitting: Family Medicine

## 2015-02-26 ENCOUNTER — Encounter: Payer: Self-pay | Admitting: Family Medicine

## 2015-02-26 VITALS — BP 130/84 | HR 82 | Temp 97.5°F | Ht 64.0 in | Wt 155.0 lb

## 2015-02-26 DIAGNOSIS — M25531 Pain in right wrist: Secondary | ICD-10-CM

## 2015-02-26 MED ORDER — TRAMADOL HCL 50 MG PO TABS
50.0000 mg | ORAL_TABLET | Freq: Four times a day (QID) | ORAL | Status: DC | PRN
Start: 2015-02-26 — End: 2015-08-06

## 2015-02-26 NOTE — Progress Notes (Signed)
Pre visit review using our clinic review tool, if applicable. No additional management support is needed unless otherwise documented below in the visit note. 

## 2015-02-26 NOTE — Progress Notes (Signed)
Dr. Frederico Hamman T. Copland, MD, [facility] Medicine [place], [address] Phone: [phone] Fax: [phone]  02/26/2015  Patient: Krystal Walker, MRN: [id number], DOB: Feb 13, 1956, 59 y.o.  Primary Physician:  Arnette Norris, MD  Chief Complaint: Follow-up  Subjective:   Krystal Walker is a 59 y.o. very pleasant female patient who presents with the following:  F/u R wrist pain.  Did not go to bed until 3 AM.  Mind is racing at night.   R wrist pain, supination lift test is +. The patient continues to have wrist pain as detailed below.  Ongoing for now at least 3 months, and right-sided wrist pain.  The patient is right-hand dominant.  On her last office visit I felt like she was mostly having de Quervain's.  I place her in a thumb spica splint, and now she is not really having too much of the difficulty with her thumb, and primarily is having pain in her true wrist and some swelling.  01/25/2015 Last OV with Owens Loffler, MD  12/18/2014 OV with Mrs. Garnette Gunner, FNP reviewed.   The patient reports to me that she has had ongoing wrist pain for approximately 2 or 3 months.  She describes a specific incident that occurred at work to me where her wrist was moved in an unusual way and she felt some significant pain at that time acutely.  She didn't fall directly on her wrists.  She has not had any bruising.  There is a mild amount of some swelling more dorsally as well as radially.  She has been in a plane cockup wrist splint for approximately a month. She is also taking some anti-inflammatories and Tylenol.  At this point she does not think she is really any better at all.  She uses her hands repetitively all the time at work.  Basic repetitive hand motions at home are also causing discomfort now. Wrist pain - burning and pain and painful to brush teeth, unscrewing a jar.   States that she stretched it too far at work. Never had pain before  then - progressed since then.   Past Medical History, Surgical History, Social History, Family History, Problem List, Medications, and Allergies have been reviewed and updated if relevant.  Patient Active Problem List   Diagnosis Date Noted  . Lumbar back pain with radiculopathy affecting right lower extremity 04/24/2014  . Near syncope 06/13/2013  . Palpitations 06/13/2013  . Abnormal mammogram with microcalcification 09/07/2012  . Fibrocystic disease of breast 09/07/2012  . Raynaud phenomenon 04/12/2012  . IBS (irritable bowel syndrome) 04/12/2012  . Hypertension   . Hyperlipidemia   . Depression   . GERD (gastroesophageal reflux disease)     Past Medical History  Diagnosis Date  . Hypertension   . Hyperlipidemia   . Diabetes mellitus   . Depression   . IBS (irritable bowel syndrome)   . Osteoporosis   . Raynaud disease   . GERD (gastroesophageal reflux disease)   . Raynaud phenomenon 1982  . Personal history of tobacco use, presenting hazards to health   . Breast screening, unspecified   . Mammographic microcalcification   . Special screening for malignant neoplasms, colon   . Bowel trouble 1997  . Radiation     for adenoid cystic carcinoma  . 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 Abdominal hysterectomy  2004    partial  . Salivary gland surgery  1999  . Colonoscopy  2010  . Parotidectomy Right     adenoid cystic carcinoma  . Breast biopsy Left 02-23-12    left breast stereotactic biopsy; benign    Social History   Social History  . Marital Status: Single    Spouse Name: N/A  . Number of Children: N/A  . Years of Education: N/A   Occupational History  . Not on file.   Social History Main Topics  . Smoking status: Former Smoker -- 0.50 packs/day for 15 years    Types: Cigarettes  . Smokeless tobacco: Never Used     Comment: quit 1986  . Alcohol Use: No  . Drug Use: No  .  Sexual Activity: Not on file   Other Topics Concern  . Not on file   Social History Narrative    Family History  Problem Relation Age of Onset  . Arrhythmia Mother     pacemaker  . Fainting Mother   . Hypertension Mother   . Hyperlipidemia Mother     Allergies  Allergen Reactions  . Latex   . Morphine And Related Nausea And Vomiting    Medication list reviewed and updated in full in [facility].  GEN: No fevers, chills. Nontoxic. Primarily MSK c/o today. MSK: Detailed in the HPI GI: tolerating PO intake without difficulty Neuro: No numbness, parasthesias, or tingling associated. Otherwise the pertinent positives of the ROS are noted above.   Objective:   BP 130/84 mmHg  Pulse 82  Temp(Src) 97.5 F (36.4 C) (Oral)  Ht 5\' 4"  (1.626 m)  Wt 155 lb (70.308 kg)  BMI 26.59 kg/m2   GEN: WDWN, NAD, Non-toxic, Alert & Oriented x 3 HEENT: Atraumatic, Normocephalic.  Ears and Nose: No external deformity. EXTR: No clubbing/cyanosis/edema NEURO: Normal gait.  PSYCH: Normally interactive. Conversant. Not depressed or anxious appearing.  Calm demeanor.   Hand: R Ecchymosis or edema: mild dorsally in the true wrist ROM wrist/hand/digits/elbow: pain with flexion, extension Carpals, MCP's, digits: NT Distal Ulna and Radius: NT Supination lift test: POS Ecchymosis or edema: neg Cysts/nodules: neg Finkelstein's test: minimally positive Snuffbox tenderness: neg Scaphoid tubercle: NT Hook of Hamate: NT Resisted supination: PAIN Full composite fist Grip, all digits: 4+/5 str No tenosynovitis Axial load test: POS Phalen's: neg Tinel's: neg Atrophy: neg  Hand sensation: intact   Radiology: CLINICAL DATA: Several week history of right wrist and thumb pain without history of trauma  EXAM: RIGHT WRIST - COMPLETE 3+ VIEW  COMPARISON: None.  FINDINGS: The bones of the right wrist are adequately mineralized. There is no acute or healing fracture. The  radiocarpal, ulnocarpal, and intercarpal joints are unremarkable. Specific attention to the first carpometacarpal joint reveals no acute abnormality. The other carpometacarpal joints are normal as well. The first MCP as well as the IP joint of the thumb reveal no significant abnormalities.  IMPRESSION: There is no acute or chronic bony abnormality of the right thumb or right wrist.   Electronically Signed  By: David Martinique M.D.  On: 12/18/2014 16:23  Assessment and Plan:   Right wrist pain - Plan: MR Wrist Right W Contrast, DG FLUORO GUIDE NDL PLCD/BX/INJ/LOC  3 month history of persistent wrist pain with failure of conservative management thus far.  Swelling and pain in the wrist joint, and the patient has a positive supination lift-off test.  She has been wearing her thumb spica splint now for one month, and her  thumb is calm down somewhat, but her primary pain is in her wrist.  History of injury.  Obtain an MR arthrogram of the right wrist to evaluate for occult internal derangement not seen on plain x-rays including potential ligamentous disruption, scapholunate dissociation, fracture not seen on initial film, or other internal derangement.  New Prescriptions   No medications on file   Orders Placed This Encounter  Procedures  . MR Wrist Right W Contrast  . DG FLUORO GUIDE NDL PLCD/BX/INJ/LOC    Signed,  Frederico Hamman T. Copland, MD   Patient's Medications  New Prescriptions   No medications on file  Previous Medications   CALCIUM-VITAMIN D PO    2 (two) times daily. Take one by mouth daily   FISH OIL-OMEGA-3 FATTY ACIDS 1000 MG CAPSULE    Take one by mouth twice daily   GABAPENTIN (NEURONTIN) 300 MG CAPSULE    TAKE 4 CAPSULES BY MOUTH 3  TIMES DAILY.   GLUCOSAMINE SULFATE PO    Take 2000 mg's by mouth daily   MELOXICAM (MOBIC) 15 MG TABLET    Take 1 tablet by mouth  daily   METOPROLOL TARTRATE (LOPRESSOR) 25 MG TABLET    Take 1 tablet (25 mg total) by mouth 2 (two)  times daily.   MULTIPLE VITAMIN (MULTIVITAMIN) TABLET    Take 1 tablet by mouth daily.   OMEPRAZOLE (PRILOSEC) 40 MG CAPSULE    Take 1 capsule by mouth  every day   PRAVASTATIN (PRAVACHOL) 10 MG TABLET    Take 1 tablet by mouth  daily   TOPIRAMATE (TOPAMAX) 25 MG TABLET    Take 1 tablet by mouth  daily  Modified Medications   Modified Medication Previous Medication   TRAMADOL (ULTRAM) 50 MG TABLET traMADol (ULTRAM) 50 MG tablet      Take 1 tablet (50 mg total) by mouth every 6 (six) hours as needed.    Take 1 tablet (50 mg total) by mouth every 6 (six) hours as needed.  Discontinued Medications   AMITRIPTYLINE (ELAVIL) 25 MG TABLET    Take 25 mg by mouth at bedtime.   PROBIOTIC PRODUCT (ALIGN PO)    Take by mouth daily.

## 2015-02-26 NOTE — Patient Instructions (Signed)

## 2015-03-05 ENCOUNTER — Ambulatory Visit
Admission: RE | Admit: 2015-03-05 | Discharge: 2015-03-05 | Disposition: A | Discharge status: Home / Self Care | Payer: 59 | Source: Ambulatory Visit | Attending: Family Medicine | Admitting: Family Medicine

## 2015-03-05 ENCOUNTER — Other Ambulatory Visit: Payer: Self-pay | Admitting: Family Medicine

## 2015-03-05 DIAGNOSIS — M25531 Pain in right wrist: Secondary | ICD-10-CM

## 2015-03-12 ENCOUNTER — Telehealth: Payer: Self-pay | Admitting: *Deleted

## 2015-03-12 ENCOUNTER — Telehealth: Payer: Self-pay | Admitting: Family Medicine

## 2015-03-12 NOTE — Telephone Encounter (Signed)
Labs given to Dr Deborra Medina for review

## 2015-03-12 NOTE — Telephone Encounter (Signed)
Spoke to pt who states that she was to have a MRI completed on her R wrist, that you ordered. She states that she was required to pay a deposit of $150 in order to schedule the MRI. She states that on the day before she was to have the imaging completed, she received a call with confirming medications and other requirements and stipulations for the MRI. She was advised that she was unable to have it completed  Due to taking Meloxicam and ibuprofen, and was then quoted an amount of $1500 that she cannot afford. She is wanting to know if there is any alternative for her wrist pain, whether it be an injection or alternate imaging process.

## 2015-03-12 NOTE — Telephone Encounter (Signed)
Pt dropped off a copy of labs.   On [place] desk

## 2015-03-12 NOTE — Telephone Encounter (Signed)
pts last labs 06/2013. Pt states that when she saw Dr Lorelei Pont, she gave him a copy of her recent labs. Copy not scanned into pt chart.

## 2015-03-13 NOTE — Telephone Encounter (Signed)
Krystal Walker notified as instructed by telephone.  She would like to do the wrist injection with Dr. Lorelei Pont.  Appointment scheduled for 03/26/2015 at 8:00 am with Dr. Lorelei Pont.

## 2015-03-13 NOTE — Telephone Encounter (Signed)
Left message for [name] to return my call.

## 2015-03-13 NOTE — Telephone Encounter (Signed)
i signed them and sent for scanning.

## 2015-03-13 NOTE — Telephone Encounter (Signed)
MRI's are expensive. Her financial responsibility depends on her own insurance coverage.  Other options would be to have her see a hand surgeon to get their opinion without additional imaging.   We could try a wrist injection. At the very least, I don't think it would be harmful, and it might work. If she would like, she can schedule an appointment with me. Or I can refer her to hand surgery - her choice.   Electronically Signed  By: Owens Loffler, MD On: 03/13/2015 8:17 AM

## 2015-03-23 ENCOUNTER — Other Ambulatory Visit: Payer: Self-pay | Admitting: Family Medicine

## 2015-03-26 ENCOUNTER — Encounter: Payer: Self-pay | Admitting: Family Medicine

## 2015-03-26 ENCOUNTER — Ambulatory Visit: Payer: 59 | Admitting: Family Medicine

## 2015-03-26 ENCOUNTER — Ambulatory Visit (INDEPENDENT_AMBULATORY_CARE_PROVIDER_SITE_OTHER)
Admission: RE | Admit: 2015-03-26 | Discharge: 2015-03-26 | Disposition: A | Discharge status: Home / Self Care | Payer: 59 | Source: Ambulatory Visit | Attending: Family Medicine | Admitting: Family Medicine

## 2015-03-26 ENCOUNTER — Ambulatory Visit (INDEPENDENT_AMBULATORY_CARE_PROVIDER_SITE_OTHER): Payer: 59 | Admitting: Family Medicine

## 2015-03-26 VITALS — BP 124/80 | HR 78 | Temp 97.5°F | Ht 64.0 in | Wt 154.5 lb

## 2015-03-26 DIAGNOSIS — M25431 Effusion, right wrist: Secondary | ICD-10-CM | POA: Diagnosis not present

## 2015-03-26 DIAGNOSIS — M25531 Pain in right wrist: Secondary | ICD-10-CM

## 2015-03-26 MED ORDER — METHYLPREDNISOLONE ACETATE 40 MG/ML IJ SUSP
20.0000 mg | Freq: Once | INTRAMUSCULAR | Status: AC
  Administered 2015-03-26: 20 mg via INTRA_ARTICULAR

## 2015-03-26 NOTE — Progress Notes (Signed)
Dr. Frederico Hamman T. Corin Tilly, MD, [facility] [place], [address] Phone: [phone] Fax: [phone]  03/26/2015  Patient: Krystal Walker, MRN: [id number], DOB: [birth year], 59 y.o.  Primary Physician:  Arnette Norris, MD  Chief Complaint: Wrist Pain  Subjective:   Krystal Walker is a 59 y.o. very pleasant female patient who presents with the following:  F/u R wrist pain, did not get MR arthrogram: Pleasant patient known well who presents with f/u regarding right wrist pain and injury. Initial plain films were negative, and with persistent sx, I referred her for an MR arthrogram on 9/19, but she never got the imaging study. Radiology would not perform at [facility] while on Mobic.   Symptoms are a little better, but they persist.  Not wearing splint.   inj wrist  02/26/2015 Last OV with Owens Loffler, MD  F/u R wrist pain.  Did not go to bed until 3 AM.  Mind is racing at night.   R wrist pain, supination lift test is +. The patient continues to have wrist pain as detailed below.  Ongoing for now at least 3 months, and right-sided wrist pain.  The patient is right-hand dominant.  On her last office visit I felt like she was mostly having de Quervain's.  I place her in a thumb spica splint, and now she is not really having too much of the difficulty with her thumb, and primarily is having pain in her true wrist and some swelling.  01/25/2015 Last OV with Owens Loffler, MD  12/18/2014 OV with Mrs. Garnette Gunner, FNP reviewed.   The patient reports to me that she has had ongoing wrist pain for approximately 2 or 3 months.  She describes a specific incident that occurred at work to me where her wrist was moved in an unusual way and she felt some significant pain at that time acutely.  She didn't fall directly on her wrists.  She has not had any bruising.  There is a mild amount of some swelling more dorsally as well as radially.  She has  been in a plane cockup wrist splint for approximately a month. She is also taking some anti-inflammatories and Tylenol.  At this point she does not think she is really any better at all.  She uses her hands repetitively all the time at work.  Basic repetitive hand motions at home are also causing discomfort now. Wrist pain - burning and pain and painful to brush teeth, unscrewing a jar.   States that she stretched it too far at work. Never had pain before then - progressed since then.   Past Medical History, Surgical History, Social History, Family History, Problem List, Medications, and Allergies have been reviewed and updated if relevant.  Patient Active Problem List   Diagnosis Date Noted  . Lumbar back pain with radiculopathy affecting right lower extremity 04/24/2014  . Near syncope 06/13/2013  . Palpitations 06/13/2013  . Abnormal mammogram with microcalcification 09/07/2012  . Fibrocystic disease of breast 09/07/2012  . Raynaud phenomenon 04/12/2012  . IBS (irritable bowel syndrome) 04/12/2012  . Hypertension   . Hyperlipidemia   . Depression   . GERD (gastroesophageal reflux disease)     Past Medical History  Diagnosis Date  . Hypertension   . Hyperlipidemia   . Diabetes mellitus   . Depression   . IBS (irritable bowel syndrome)   . Osteoporosis   . Raynaud disease   . GERD (  gastroesophageal reflux disease)   . Raynaud phenomenon 1982  . Personal history of tobacco use, presenting hazards to health   . Breast screening, unspecified   . Mammographic microcalcification   . Special screening for malignant neoplasms, colon   . Bowel trouble 1997  . Radiation     for adenoid cystic carcinoma  . 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 Abdominal hysterectomy  2004    partial  . Salivary gland surgery  1999  . Colonoscopy  2010  . Parotidectomy Right     adenoid cystic carcinoma  .  Breast biopsy Left 02-23-12    left breast stereotactic biopsy; benign    Social History   Social History  . Marital Status: Single    Spouse Name: N/A  . Number of Children: N/A  . Years of Education: N/A   Occupational History  . Not on file.   Social History Main Topics  . Smoking status: Former Smoker -- 0.50 packs/day for 15 years    Types: Cigarettes  . Smokeless tobacco: Never Used     Comment: quit 1986  . Alcohol Use: No  . Drug Use: No  . Sexual Activity: Not on file   Other Topics Concern  . Not on file   Social History Narrative    Family History  Problem Relation Age of Onset  . Arrhythmia Mother     pacemaker  . Fainting Mother   . Hypertension Mother   . Hyperlipidemia Mother     Allergies  Allergen Reactions  . Latex   . Morphine And Related Nausea And Vomiting    Medication list reviewed and updated in full in [facility].  GEN: No fevers, chills. Nontoxic. Primarily MSK c/o today. MSK: Detailed in the HPI GI: tolerating PO intake without difficulty Neuro: No numbness, parasthesias, or tingling associated. Otherwise the pertinent positives of the ROS are noted above.   Objective:   BP 124/80 mmHg  Pulse 78  Temp(Src) 97.5 F (36.4 C) (Oral)  Ht 5\' 4"  (1.626 m)  Wt 154 lb 8 oz (70.081 kg)  BMI 26.51 kg/m2   GEN: WDWN, NAD, Non-toxic, Alert & Oriented x 3 HEENT: Atraumatic, Normocephalic.  Ears and Nose: No external deformity. EXTR: No clubbing/cyanosis/edema NEURO: Normal gait.  PSYCH: Normally interactive. Conversant. Not depressed or anxious appearing.  Calm demeanor.   Hand: R Ecchymosis or edema: mild dorsally in the true wrist ROM wrist/hand/digits/elbow: pain with flexion, extension Carpals, MCP's, digits: NT Distal Ulna and Radius: NT Supination lift test: POS Ecchymosis or edema: neg Cysts/nodules: neg Finkelstein's test: neg Snuffbox tenderness: neg Scaphoid tubercle: NT Hook of Hamate: NT Resisted  supination: PAIN Full composite fist Grip, all digits: 4+/5 str No tenosynovitis Axial load test: POS Phalen's: neg Tinel's: neg Atrophy: neg  Hand sensation: intact   Radiology: CLINICAL DATA: Several week history of right wrist and thumb pain without history of trauma  EXAM: RIGHT WRIST - COMPLETE 3+ VIEW  COMPARISON: None.  FINDINGS: The bones of the right wrist are adequately mineralized. There is no acute or healing fracture. The radiocarpal, ulnocarpal, and intercarpal joints are unremarkable. Specific attention to the first carpometacarpal joint reveals no acute abnormality. The other carpometacarpal joints are normal as well. The first MCP as well as the IP joint of the thumb reveal no significant abnormalities.  IMPRESSION: There is no acute or chronic bony abnormality of  the right thumb or right wrist.   Electronically Signed  By: David Martinique M.D.  On: 12/18/2014 16:23  Dg Wrist Complete Right  03/26/2015  CLINICAL DATA:  Right wrist pain. Right wrist trauma approximately 3 months ago. EXAM: RIGHT WRIST - COMPLETE 3+ VIEW COMPARISON:  Right wrist radiographs -12/18/2014 FINDINGS: The lateral radiograph is minimally degraded due to obliquity. No fracture or dislocation. There is potential mild widening of the scapholunate articulation. Joint spaces are otherwise well preserved. No erosions. No evidence of chondrocalcinosis. Regional soft tissues appear normal. No radiopaque foreign body. IMPRESSION: Potential widening of the scapholunate articulation suggestive of ligamentous injury. Otherwise, no explanation for patient's persistent right wrist pain. Electronically Signed   By: Sandi Mariscal M.D.   On: 03/26/2015 09:01    Assessment and Plan:   Right wrist pain - Plan: DG Wrist Complete Right, methylPREDNISolone acetate (DEPO-MEDROL) injection 20 mg  Wrist swelling, right - Plan: DG Wrist Complete Right  Suspect more likely ligamentous injury, agree  on my interpretation scapholunate dissociation may be present. I discussed this when I reviewed the films with the patient, and at this point she still would like to be quite conservative if at all possible,'s mostly due to his financial reasons.  We are going to do a trial of an intra-articular injection to see if this helps.  Injection, Intraarticular Wrist, R Patient verbally consented for procedure; risks (including infection), benefits, and alternatives explained. Patient prepped with Chloraprep. Ethyl chloride used for anesthesia. Using sterile technique, just distal to Lister's tubercle, using 3 cc syringe with 22 gauge needle, needle inserted and aspirated, no blood. Then 1/2 cc Lidocaine 1% and Depo-medrol 20 mg injected without difficulty into wrist.  Pressue applied, minimal blood. Tolerated well, decreased pain, no complications.   Follow-up: if not improved in 4 weeks  New Prescriptions   No medications on file   Modified Medications   No medications on file   Orders Placed This Encounter  Procedures  . DG Wrist Complete Right    Signed,  Lakota Markgraf T. Hershal Eriksson, MD   Patient's Medications  New Prescriptions   No medications on file  Previous Medications   CALCIUM-VITAMIN D PO    2 (two) times daily. Take one by mouth daily   FISH OIL-OMEGA-3 FATTY ACIDS 1000 MG CAPSULE    Take one by mouth twice daily   GABAPENTIN (NEURONTIN) 300 MG CAPSULE    TAKE 4 CAPSULES BY MOUTH 3  TIMES DAILY.   GLUCOSAMINE SULFATE PO    Take 2000 mg's by mouth daily   MELOXICAM (MOBIC) 15 MG TABLET    Take 1 tablet by mouth  daily   MULTIPLE VITAMIN (MULTIVITAMIN) TABLET    Take 1 tablet by mouth daily.   OMEPRAZOLE (PRILOSEC) 40 MG CAPSULE    Take one capsule by mouth every day. OFFICE VISIT REQUIRED FOR ANY ADDITIONAL REFILLS   PRAVASTATIN (PRAVACHOL) 10 MG TABLET    Take 1 tablet by mouth  daily   TOPIRAMATE (TOPAMAX) 25 MG TABLET    Take 1 tablet by mouth  daily   TRAMADOL (ULTRAM) 50 MG  TABLET    Take 1 tablet (50 mg total) by mouth every 6 (six) hours as needed.  Modified Medications   No medications on file  Discontinued Medications   METOPROLOL TARTRATE (LOPRESSOR) 25 MG TABLET    Take 1 tablet (25 mg total) by mouth 2 (two) times daily.

## 2015-03-26 NOTE — Progress Notes (Signed)
Pre visit review using our clinic review tool, if applicable. No additional management support is needed unless otherwise documented below in the visit note. 

## 2015-04-12 ENCOUNTER — Encounter: Payer: Self-pay | Admitting: Family Medicine

## 2015-04-23 ENCOUNTER — Other Ambulatory Visit: Payer: Self-pay | Admitting: Family Medicine

## 2015-04-24 NOTE — Telephone Encounter (Signed)
Last f/u 04/2014- all other appts acute

## 2015-05-22 ENCOUNTER — Encounter: Payer: Self-pay | Admitting: Family Medicine

## 2015-05-22 ENCOUNTER — Ambulatory Visit (INDEPENDENT_AMBULATORY_CARE_PROVIDER_SITE_OTHER): Payer: 59 | Admitting: Family Medicine

## 2015-05-22 VITALS — BP 138/92 | HR 104 | Temp 98.0°F | Wt 143.5 lb

## 2015-05-22 DIAGNOSIS — J0181 Other acute recurrent sinusitis: Secondary | ICD-10-CM

## 2015-05-22 MED ORDER — AMOXICILLIN 875 MG PO TABS
875.0000 mg | ORAL_TABLET | Freq: Two times a day (BID) | ORAL | Status: DC
Start: 2015-05-22 — End: 2015-08-13

## 2015-05-22 NOTE — Progress Notes (Signed)
SUBJECTIVE:  Krystal Walker is a 59 y.o. female who complains of coryza, congestion, sore throat, swollen glands, post nasal drip and dry cough for 10 days. She denies a history of anorexia, chest pain and chills and denies a history of asthma. Patient denies smoke cigarettes.    Current Outpatient Prescriptions on File Prior to Visit  Medication Sig Dispense Refill  . CALCIUM-VITAMIN D PO 2 (two) times daily. Take one by mouth daily    . meloxicam (MOBIC) 15 MG tablet Take 1 tablet by mouth  daily 90 tablet 0  . Multiple Vitamin (MULTIVITAMIN) tablet Take 1 tablet by mouth daily.    Marland Kitchen omeprazole (PRILOSEC) 40 MG capsule Take one capsule by mouth every day. OFFICE VISIT REQUIRED FOR ANY ADDITIONAL REFILLS 90 capsule 0  . pravastatin (PRAVACHOL) 10 MG tablet Take 1 tablet by mouth  daily 90 tablet 3  . topiramate (TOPAMAX) 25 MG tablet Take 1 tablet by mouth  daily 90 tablet 3  . traMADol (ULTRAM) 50 MG tablet Take 1 tablet (50 mg total) by mouth every 6 (six) hours as needed. 50 tablet 2  . gabapentin (NEURONTIN) 300 MG capsule TAKE 4 CAPSULES BY MOUTH 3  TIMES DAILY. (Patient not taking: Reported on 05/22/2015) 1080 capsule 0   No current facility-administered medications on file prior to visit.    Allergies  Allergen Reactions  . Latex   . Morphine And Related Nausea And Vomiting    Past Medical History  Diagnosis Date  . Hypertension   . Hyperlipidemia   . Diabetes mellitus   . Depression   . IBS (irritable bowel syndrome)   . Osteoporosis   . Raynaud disease   . GERD (gastroesophageal reflux disease)   . Raynaud phenomenon 1982  . Personal history of tobacco use, presenting hazards to health   . Breast screening, unspecified   . Mammographic microcalcification   . Special screening for malignant neoplasms, colon   . Bowel trouble 1997  . Radiation     for adenoid cystic carcinoma  . Salivary gland cancer ([facility]) 1999    s/p resection and radiation    Past Surgical  History  Procedure Laterality Date  . Abdominal hysterectomy  2004    partial  . Salivary gland surgery  1999  . Colonoscopy  2010  . Parotidectomy Right     adenoid cystic carcinoma  . Breast biopsy Left 02-23-12    left breast stereotactic biopsy; benign    Family History  Problem Relation Age of Onset  . Arrhythmia Mother     pacemaker  . Fainting Mother   . Hypertension Mother   . Hyperlipidemia Mother     Social History   Social History  . Marital Status: Single    Spouse Name: N/A  . Number of Children: N/A  . Years of Education: N/A   Occupational History  . Not on file.   Social History Main Topics  . Smoking status: Former Smoker -- 0.50 packs/day for 15 years    Types: Cigarettes  . Smokeless tobacco: Never Used     Comment: quit 1986  . Alcohol Use: No  . Drug Use: No  . Sexual Activity: Not on file   Other Topics Concern  . Not on file   Social History Narrative   The PMH, PSH, Social History, Family History, Medications, and allergies have been reviewed in [facility], and have been updated if relevant.  OBJECTIVE: BP 138/92 mmHg  Pulse 104  Temp(Src) 98  F (36.7 C) (Oral)  Wt 143 lb 8 oz (65.091 kg)  SpO2 98%  She appears well, vital signs are as noted. Ears normal.  Throat and pharynx normal.  Neck supple. No adenopathy in the neck. Nose is congested. Sinuses  tender. The chest is clear, without wheezes or rales.  ASSESSMENT:  sinusitis  PLAN: Given duration and progression of symptoms, will treat for bacterial sinusitis with amoxicillin. Symptomatic therapy suggested: push fluids, rest and return office visit prn if symptoms persist or worsen.Call or return to clinic prn if these symptoms worsen or fail to improve as anticipated.

## 2015-05-22 NOTE — Patient Instructions (Signed)
Happy Holidays.  Take amoxicillin as directed- 1 tablet twice daily x 10 days.  Drink lots of fluids.    Treat sympotmatically with Mucinex, nasal saline irrigation, and Tylenol/Ibuprofen.  Try over the counter nasocort-start with 2 sprays per nostril per day...and then try to taper to 1 spray per nostril once symptoms improve.   You can use warm compresses.     Call if not improving as expected in 5-7 days.

## 2015-05-22 NOTE — Progress Notes (Signed)
Pre visit review using our clinic review tool, if applicable. No additional management support is needed unless otherwise documented below in the visit note. 

## 2015-05-30 ENCOUNTER — Telehealth: Payer: Self-pay

## 2015-05-30 NOTE — Telephone Encounter (Signed)
Please get more information on how she is feeling.

## 2015-05-30 NOTE — Telephone Encounter (Signed)
Patient called stating that she was out on amoxicillin on 05/22/15 - she states that she is not feeling any better and is concerned that it may not be helping. Patient states that she is going out of town for [place] and that she was not sure if something else needed to be prescribed? Thanks!

## 2015-05-31 NOTE — Telephone Encounter (Signed)
Spoke to pt and advised per Dr Aron.  

## 2015-05-31 NOTE — Telephone Encounter (Signed)
Yes ok to take imodium.

## 2015-05-31 NOTE — Telephone Encounter (Signed)
Spoke to pt who states that she has been taking the abx, but it has been causing diarrhea. Pt is wondering if she is able to take immodium.

## 2015-06-21 ENCOUNTER — Other Ambulatory Visit: Payer: Self-pay | Admitting: Family Medicine

## 2015-06-22 ENCOUNTER — Other Ambulatory Visit: Payer: Self-pay | Admitting: Family Medicine

## 2015-07-02 ENCOUNTER — Other Ambulatory Visit: Payer: Self-pay | Admitting: Family Medicine

## 2015-08-06 ENCOUNTER — Telehealth: Payer: Self-pay | Admitting: Family Medicine

## 2015-08-06 ENCOUNTER — Emergency Department
Admission: EM | Admit: 2015-08-06 | Discharge: 2015-08-06 | Disposition: A | Discharge status: Home / Self Care | Payer: 59 | Attending: Emergency Medicine | Admitting: Emergency Medicine

## 2015-08-06 ENCOUNTER — Encounter: Payer: Self-pay | Admitting: Emergency Medicine

## 2015-08-06 DIAGNOSIS — I10 Essential (primary) hypertension: Secondary | ICD-10-CM

## 2015-08-06 DIAGNOSIS — R51 Headache: Secondary | ICD-10-CM

## 2015-08-06 DIAGNOSIS — R519 Headache, unspecified: Secondary | ICD-10-CM

## 2015-08-06 DIAGNOSIS — E119 Type 2 diabetes mellitus without complications: Secondary | ICD-10-CM | POA: Insufficient documentation

## 2015-08-06 DIAGNOSIS — F419 Anxiety disorder, unspecified: Secondary | ICD-10-CM | POA: Insufficient documentation

## 2015-08-06 DIAGNOSIS — Z91040 Latex allergy status: Secondary | ICD-10-CM | POA: Diagnosis not present

## 2015-08-06 DIAGNOSIS — Z87891 Personal history of nicotine dependence: Secondary | ICD-10-CM | POA: Diagnosis not present

## 2015-08-06 LAB — COMPREHENSIVE METABOLIC PANEL
ALK PHOS: 68 U/L (ref 38–126)
ALT: 25 U/L (ref 14–54)
AST: 35 U/L (ref 15–41)
Albumin: 4.8 g/dL (ref 3.5–5.0)
Anion gap: 9 (ref 5–15)
BUN: 17 mg/dL (ref 6–20)
CALCIUM: 10.6 mg/dL — AB (ref 8.9–10.3)
CHLORIDE: 107 mmol/L (ref 101–111)
CO2: 23 mmol/L (ref 22–32)
CREATININE: 1.03 mg/dL — AB (ref 0.44–1.00)
GFR, EST NON AFRICAN AMERICAN: 58 mL/min — AB (ref >60)
Glucose, Bld: 117 mg/dL — ABNORMAL HIGH (ref 65–99)
Potassium: 3.8 mmol/L (ref 3.5–5.1)
SODIUM: 139 mmol/L (ref 135–145)
Total Bilirubin: 0.5 mg/dL (ref 0.3–1.2)
Total Protein: 7.6 g/dL (ref 6.5–8.1)

## 2015-08-06 LAB — CBC WITH DIFFERENTIAL/PLATELET
BASOS ABS: 0 10*3/uL (ref 0–0.1)
Basophils Relative: 0 %
Eosinophils Absolute: 0 10*3/uL (ref 0–0.7)
Eosinophils Relative: 1 %
HCT: 41.3 % (ref 35.0–47.0)
HEMOGLOBIN: 14.2 g/dL (ref 12.0–16.0)
LYMPHS ABS: 1 10*3/uL (ref 1.0–3.6)
LYMPHS PCT: 20 %
MCH: 30.9 pg (ref 26.0–34.0)
MCHC: 34.3 g/dL (ref 32.0–36.0)
MCV: 90.2 fL (ref 80.0–100.0)
Monocytes Absolute: 0.4 10*3/uL (ref 0.2–0.9)
Monocytes Relative: 7 %
NEUTROS ABS: 3.8 10*3/uL (ref 1.4–6.5)
NEUTROS PCT: 72 %
Platelets: 172 10*3/uL (ref 150–440)
RBC: 4.58 MIL/uL (ref 3.80–5.20)
RDW: 13.2 % (ref 11.5–14.5)
WBC: 5.3 10*3/uL (ref 3.6–11.0)

## 2015-08-06 LAB — TROPONIN I: Troponin I: <0.03 ng/mL (ref <0.031)

## 2015-08-06 MED ORDER — BUTALBITAL-APAP-CAFFEINE 50-325-40 MG PO TABS
2.0000 | ORAL_TABLET | Freq: Once | ORAL | Status: AC
  Administered 2015-08-06: 2 via ORAL
  Filled 2015-08-06: qty 2

## 2015-08-06 MED ORDER — BUTALBITAL-APAP-CAFFEINE 50-325-40 MG PO TABS
1.0000 | ORAL_TABLET | Freq: Four times a day (QID) | ORAL | Status: DC | PRN
Start: 2015-08-06 — End: 2015-08-13

## 2015-08-06 NOTE — Discharge Instructions (Signed)
General Headache Without Cause A headache is pain or discomfort felt around the head or neck area. The specific cause of a headache may not be found. There are many causes and types of headaches. A few common ones are:  Tension headaches.  Migraine headaches.  Cluster headaches.  Chronic daily headaches. HOME CARE INSTRUCTIONS  Watch your condition for any changes. Take these steps to help with your condition: Managing Pain  Take over-the-counter and prescription medicines only as told by your health care provider.  Lie down in a dark, quiet room when you have a headache.  If directed, apply ice to the head and neck area:  Put ice in a plastic bag.  Place a towel between your skin and the bag.  Leave the ice on for 20 minutes, 2-3 times per day.  Use a heating pad or hot shower to apply heat to the head and neck area as told by your health care provider.  Keep lights dim if bright lights bother you or make your headaches worse. Eating and Drinking  Eat meals on a regular schedule.  Limit alcohol use.  Decrease the amount of caffeine you drink, or stop drinking caffeine. General Instructions  Keep all follow-up visits as told by your health care provider. This is important.  Keep a headache journal to help find out what may trigger your headaches. For example, write down:  What you eat and drink.  How much sleep you get.  Any change to your diet or medicines.  Try massage or other relaxation techniques.  Limit stress.  Sit up straight, and do not tense your muscles.  Do not use tobacco products, including cigarettes, chewing tobacco, or e-cigarettes. If you need help quitting, ask your health care provider.  Exercise regularly as told by your health care provider.  Sleep on a regular schedule. Get 7-9 hours of sleep, or the amount recommended by your health care provider. SEEK MEDICAL CARE IF:   Your symptoms are not helped by medicine.  You have a  headache that is different from the usual headache.  You have nausea or you vomit.  You have a fever. SEEK IMMEDIATE MEDICAL CARE IF:   Your headache becomes severe.  You have repeated vomiting.  You have a stiff neck.  You have a loss of vision.  You have problems with speech.  You have pain in the eye or ear.  You have muscular weakness or loss of muscle control.  You lose your balance or have trouble walking.  You feel faint or pass out.  You have confusion.   This information is not intended to replace advice given to you by your health care provider. Make sure you discuss any questions you have with your health care provider.   Document Released: 05/26/2005 Document Revised: 02/14/2015 Document Reviewed: 09/18/2014 Elsevier Interactive Patient Education 2016 [employer].  Hypertension Hypertension is another name for high blood pressure. High blood pressure forces your heart to work harder to pump blood. A blood pressure reading has two numbers, which includes a higher number over a lower number (example: 110/72). HOME CARE   Have your blood pressure rechecked by your doctor.  Only take medicine as told by your doctor. Follow the directions carefully. The medicine does not work as well if you skip doses. Skipping doses also puts you at risk for problems.  Do not smoke.  Monitor your blood pressure at home as told by your doctor. GET HELP IF:  You think you  are having a reaction to the medicine you are taking.  You have repeat headaches or feel dizzy.  You have puffiness (swelling) in your ankles.  You have trouble with your vision. GET HELP RIGHT AWAY IF:   You get a very bad headache and are confused.  You feel weak, numb, or faint.  You get chest or belly (abdominal) pain.  You throw up (vomit).  You cannot breathe very well. MAKE SURE YOU:   Understand these instructions.  Will watch your condition.  Will get help right away if you are  not doing well or get worse.   This information is not intended to replace advice given to you by your health care provider. Make sure you discuss any questions you have with your health care provider.   Document Released: 11/12/2007 Document Revised: 05/31/2013 Document Reviewed: 03/18/2013 Elsevier Interactive Patient Education [employer].

## 2015-08-06 NOTE — Telephone Encounter (Signed)
Lm on pts vm requesting a call back. She is probably still at the ED

## 2015-08-06 NOTE — ED Provider Notes (Signed)
[facility] Emergency Department Provider Note     Time seen: ----------------------------------------- 3:44 PM on 08/06/2015 -----------------------------------------    I have reviewed the triage vital signs and the nursing notes.   HISTORY  Chief Complaint Hypertension    HPI Krystal Walker is a 60 y.o. female who presents ER for elevated blood pressure and headache today. Patient states she's not been on a pressure medicines for the last year due to the fact that it would lower her blood pressure too much. She does report chronic headaches, recent headache related to sinusitis and she had been on antibiotics for 40 days. She states never when blows R she gets a headache, the minute she stops taking antibiotics for headaches return. Patient states she felt like her heart was racing this morning, denies any other complaints this time.   Past Medical History  Diagnosis Date  . Hypertension   . Hyperlipidemia   . Diabetes mellitus   . Depression   . IBS (irritable bowel syndrome)   . Osteoporosis   . Raynaud disease   . GERD (gastroesophageal reflux disease)   . Raynaud phenomenon 1982  . Personal history of tobacco use, presenting hazards to health   . Breast screening, unspecified   . Mammographic microcalcification   . Special screening for malignant neoplasms, colon   . Bowel trouble 1997  . Radiation     for adenoid cystic carcinoma  . 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 Lumbar back pain with radiculopathy affecting right lower extremity 04/24/2014  . Near syncope 06/13/2013  . Palpitations 06/13/2013  . Abnormal mammogram with microcalcification 09/07/2012  . Fibrocystic disease of breast 09/07/2012  . Raynaud phenomenon 04/12/2012  . IBS (irritable bowel syndrome) 04/12/2012  . Hypertension   . Hyperlipidemia   . Depression   . GERD  (gastroesophageal reflux disease)     Past Surgical History  Procedure Laterality Date  . Abdominal hysterectomy  2004    partial  . Salivary gland surgery  1999  . Colonoscopy  2010  . Parotidectomy Right     adenoid cystic carcinoma  . Breast biopsy Left 02-23-12    left breast stereotactic biopsy; benign    Allergies Latex and Morphine and related  Social History Social History  Substance Use Topics  . Smoking status: Former Smoker -- 0.50 packs/day for 15 years    Types: Cigarettes  . Smokeless tobacco: Never Used     Comment: quit 1986  . Alcohol Use: No    Review of Systems Constitutional: Negative for fever. Eyes: Negative for visual changes. ENT: Negative for sore throat. Cardiovascular: Negative for chest pain. Respiratory: Negative for shortness of breath. Gastrointestinal: Negative for abdominal pain, vomiting and diarrhea. Genitourinary: Negative for dysuria. Musculoskeletal: Negative for back pain. Skin: Negative for rash. Neurological: Positive for headache 10-point ROS otherwise negative.  ____________________________________________   PHYSICAL EXAM:  VITAL SIGNS: ED Triage Vitals  Enc Vitals Group     BP 08/06/15 1236 175/117 mmHg     Pulse Rate 08/06/15 1236 120     Resp 08/06/15 1236 18     Temp 08/06/15 1236 98 F (36.7 C)     Temp Source 08/06/15 1236 Oral     SpO2 08/06/15 1236 100 %     Weight 08/06/15 1236 139 lb (63.05 kg)     Height 08/06/15 1236 5\' 4"  (1.626  m)     Head Cir --      Peak Flow --      Pain Score 08/06/15 1240 1     Pain Loc --      Pain Edu? --      Excl. in [place]? --     Constitutional: Alert and oriented. Anxious, no acute distress Eyes: Conjunctivae are normal. PERRL. Normal extraocular movements. ENT   Head: Normocephalic and atraumatic.   Nose: No congestion/rhinnorhea.   Mouth/Throat: Mucous membranes are moist.   Neck: No stridor. Cardiovascular: Normal rate, regular rhythm. Normal and  symmetric distal pulses are present in all extremities. No murmurs, rubs, or gallops. Respiratory: Normal respiratory effort without tachypnea nor retractions. Breath sounds are clear and equal bilaterally. No wheezes/rales/rhonchi. Gastrointestinal: Soft and nontender. No distention. No abdominal bruits.  Musculoskeletal: Nontender with normal range of motion in all extremities. No joint effusions.  No lower extremity tenderness nor edema. Neurologic:  Normal speech and language. No gross focal neurologic deficits are appreciated. Speech is normal. No gait instability. Skin:  Skin is warm, dry and intact. No rash noted. Psychiatric: Mood and affect are normal.  ____________________________________________  EKG: Interpreted by me. Sinus tachycardia with a rate of 104 bpm, normal PR interval, normal QRS, normal QT interval. Normal axis. Nonspecific T-wave changes.  ____________________________________________  ED COURSE:  Pertinent labs & imaging results that were available during my care of the patient were reviewed by me and considered in my medical decision making (see chart for details). Patient is in no acute distress, appears anxious more than anything else. It is unclear if she has hypertension or not. We'll prescribe Fioricet for short period and refer her to neurology for follow-up ____________________________________________    LABS (pertinent positives/negatives)  Labs Reviewed  COMPREHENSIVE METABOLIC PANEL - Abnormal; Notable for the following:    Glucose, Bld 117 (*)    Creatinine, Ser 1.03 (*)    Calcium 10.6 (*)    GFR calc non Af Amer 58 (*)    All other components within normal limits  CBC WITH DIFFERENTIAL/PLATELET  TROPONIN I   ____________________________________________  FINAL ASSESSMENT AND PLAN  Headache, hypertension  Plan: Patient with labs and imaging as dictated above. Labs are grossly within normal limits. She'll be prescribed Fioricet and neurology  outpatient referral.   Earleen Newport, MD   Earleen Newport, MD 08/06/15 [phone]

## 2015-08-06 NOTE — Telephone Encounter (Signed)
Per chart review tab pt is at [facility] ED now. 

## 2015-08-06 NOTE — Telephone Encounter (Signed)
Please call to check on pt. 

## 2015-08-06 NOTE — ED Notes (Signed)
Pt to ed with c/o elevated blood pressure and headache today.  Pt states she has not been on bp meds for the last year due to the fact that it would lower her bp too low.  Pt reports recent headache related to sinusitis and antibiotics for the last 40 days. States finished them last week.  Pt states "the minute I stop taking the abx, the headaches return."  Pt also states she gets a headache when she is next to cool air, or a fan.  Denies chest pain, but states, "it felt like my heart was racing this morning"  Does admit to recent use of over the counter meds for cold symptoms. Denies chest discomfort now,  Denies sob.  Pt lungs clear to auscultation, heart sounds noted, sr on monitor at this time, skin warm and dry and pt alert and oriented.

## 2015-08-06 NOTE — ED Notes (Signed)
States has noted blood pressure high since yesterday, states is "on the verge" of a headache.

## 2015-08-06 NOTE — Telephone Encounter (Signed)
[facility]  Patient Name: Krystal Walker  DOB: Dec 11, 1955    Initial Comment Caller states she's having BP problems. 146/106. P-135. Headache that wouldn't go away.   Nurse Assessment  Nurse: Wayne Sever, RN, Tillie Rung Date/Time (Eastern Time): 08/06/2015 12:05:51 PM  Confirm and document reason for call. If symptomatic, describe symptoms. You must click the next button to save text entered. ---Caller states her BP is 146/106 and resting pulse is 135 bpm. Caller is also having headache. She feels like her heart is racing.  Has the patient traveled out of the country within the last 30 days? ---Not Applicable  Does the patient have any new or worsening symptoms? ---Yes  Will a triage be completed? ---Yes  Related visit to physician within the last 2 weeks? ---No  Does the PT have any chronic conditions? (i.e. diabetes, asthma, etc.) ---Yes  List chronic conditions. ---Migraines, IBS, High Cholesterol  Is this a behavioral health or substance abuse call? ---No     Guidelines    Guideline Title Affirmed Question Affirmed Notes  Heart Rate and Heartbeat Questions Dizziness, lightheadedness, or weakness    Final Disposition User   Go to ED Now Wayne Sever, RN, [facility] - ED   Disagree/Comply: Comply

## 2015-08-13 ENCOUNTER — Ambulatory Visit (INDEPENDENT_AMBULATORY_CARE_PROVIDER_SITE_OTHER): Payer: 59 | Admitting: Family Medicine

## 2015-08-13 ENCOUNTER — Encounter: Payer: Self-pay | Admitting: Family Medicine

## 2015-08-13 VITALS — BP 132/82 | HR 93 | Temp 97.9°F | Wt 140.2 lb

## 2015-08-13 DIAGNOSIS — R51 Headache: Secondary | ICD-10-CM

## 2015-08-13 DIAGNOSIS — E785 Hyperlipidemia, unspecified: Secondary | ICD-10-CM

## 2015-08-13 DIAGNOSIS — R519 Headache, unspecified: Secondary | ICD-10-CM | POA: Insufficient documentation

## 2015-08-13 DIAGNOSIS — F32A Depression, unspecified: Secondary | ICD-10-CM

## 2015-08-13 DIAGNOSIS — I10 Essential (primary) hypertension: Secondary | ICD-10-CM

## 2015-08-13 DIAGNOSIS — Z566 Other physical and mental strain related to work: Secondary | ICD-10-CM

## 2015-08-13 DIAGNOSIS — F329 Major depressive disorder, single episode, unspecified: Secondary | ICD-10-CM

## 2015-08-13 MED ORDER — BUTALBITAL-APAP-CAFFEINE 50-325-40 MG PO TABS: 1.0000 | ORAL_TABLET | Freq: Four times a day (QID) | ORAL | Status: DC | PRN

## 2015-08-13 NOTE — Patient Instructions (Signed)
Great to see you. Please start taking OTC probiotic like Align daily.  I am sorry you are under stress.  Please make an appointment with Dr. Melrose Nakayama.

## 2015-08-13 NOTE — Progress Notes (Signed)
Subjective:   Patient ID: Krystal Walker, female    DOB: 08/02/1955, 60 y.o.   MRN: [id number]  Krystal Walker is a pleasant 60 y.o. year old 60 y.o. year old female who presents to clinic today with Hospitalization Follow-up and Headache  on 08/13/2015  HPI:  Was seen in ED on 2/27 for elevated blood pressure and HA.   Notes and studies reviewed.  Has been having frequent headaches for months.  [facility]- per pt was 168/110 and was told to go to the ED.  EKG, CBC, CMET and troponin neg.  Given rx for fioricet and advised neuro follow up. Does not have an appointment yet scheduled with Dr. Melrose Nakayama but per pt, she was referred to him.  Of note, has been seeing ENT for "sinus headaches."  Completed multiple rounds of antibiotics and oral steroids but headaches have persisted.  HA are frontal, bilateral.  Not associated with  vomiting or photophobia.  Has had some intermittent nausea.  Does have a h/o migraines- takes topamax nightly for prophylaxis and has for years. Feels these headaches are different.  Fioricet has helped. No other focal neurological symptoms.  She has been under significant stress at work.  Current Outpatient Prescriptions on File Prior to Visit  Medication Sig Dispense Refill  . butalbital-acetaminophen-caffeine (FIORICET) 50-325-40 MG tablet Take 1-2 tablets by mouth every 6 (six) hours as needed for headache. 20 tablet 0  . Calcium Carbonate-Vitamin D (CALCIUM 600+D) 600-400 MG-UNIT tablet Take 1 tablet by mouth daily.    . meloxicam (MOBIC) 15 MG tablet Take 15 mg by mouth daily.    . Multiple Vitamin (MULTIVITAMIN WITH MINERALS) TABS tablet Take 1 tablet by mouth daily.    Marland Kitchen omeprazole (PRILOSEC) 20 MG capsule Take 40 mg by mouth at bedtime.    . pravastatin (PRAVACHOL) 10 MG tablet Take 10 mg by mouth at bedtime.    . topiramate (TOPAMAX) 25 MG tablet Take 25 mg by mouth at bedtime.    . traMADol (ULTRAM) 50 MG  tablet Take 50 mg by mouth every 6 (six) hours as needed for moderate pain.     No current facility-administered medications on file prior to visit.    Allergies  Allergen Reactions  . Latex Hives and Itching  . Morphine And Related Nausea And Vomiting    Past Medical History  Diagnosis Date  . Hypertension   . Hyperlipidemia   . Diabetes mellitus   . Depression   . IBS (irritable bowel syndrome)   . Osteoporosis   . Raynaud disease   . GERD (gastroesophageal reflux disease)   . Raynaud phenomenon 1982  . Personal history of tobacco use, presenting hazards to health   . Breast screening, unspecified   . Mammographic microcalcification   . Special screening for malignant neoplasms, colon   . Bowel trouble 1997  . Radiation     for adenoid cystic carcinoma  . 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 Abdominal hysterectomy  2004    partial  . Salivary gland surgery  1999  . Colonoscopy  2010  . Parotidectomy Right     adenoid cystic carcinoma  . Breast biopsy Left 02-23-12    left breast stereotactic biopsy; benign    Family History  Problem Relation Age of Onset  . Arrhythmia Mother     pacemaker  .  Fainting Mother   . Hypertension Mother   . Hyperlipidemia Mother     Social History   Social History  . Marital Status: Single    Spouse Name: N/A  . Number of Children: N/A  . Years of Education: N/A   Occupational History  . Not on file.   Social History Main Topics  . Smoking status: Former Smoker -- 0.50 packs/day for 15 years    Types: Cigarettes  . Smokeless tobacco: Never Used     Comment: quit 1986  . Alcohol Use: No  . Drug Use: No  . Sexual Activity: Not on file   Other Topics Concern  . Not on file   Social History Narrative   The PMH, PSH, Social History, Family History, Medications, and allergies have been reviewed in [facility], and have been updated if  relevant.   Review of Systems  Constitutional: Negative.   Eyes: Negative.   Respiratory: Negative.   Cardiovascular: Negative.   Gastrointestinal: Negative.   Musculoskeletal: Negative.   Skin: Negative.   Allergic/Immunologic: Negative.   Neurological: Positive for headaches. Negative for dizziness, tremors, seizures, syncope, facial asymmetry, speech difficulty, weakness, light-headedness and numbness.  Hematological: Negative.   Psychiatric/Behavioral: Positive for sleep disturbance. Negative for suicidal ideas, hallucinations, behavioral problems, confusion, self-injury, dysphoric mood, decreased concentration and agitation. The patient is nervous/anxious. The patient is not hyperactive.   All other systems reviewed and are negative.      Objective:    BP 132/82 mmHg  Pulse 93  Temp(Src) 97.9 F (36.6 C) (Oral)  Wt 140 lb 4 oz (63.617 kg)  SpO2 99%  Wt Readings from Last 3 Encounters:  08/13/15 140 lb 4 oz (63.617 kg)  08/06/15 139 lb (63.05 kg)  05/22/15 143 lb 8 oz (65.091 kg)     Physical Exam  Constitutional: She appears well-developed and well-nourished. No distress.  HENT:  Head: Normocephalic and atraumatic.  Eyes: Conjunctivae are normal.  Cardiovascular: Normal rate.   Pulmonary/Chest: Effort normal.  Musculoskeletal: Normal range of motion.  Skin: Skin is warm and dry. She is not diaphoretic.  Psychiatric: She has a normal mood and affect. Her behavior is normal. Judgment and thought content normal.  Nursing note and vitals reviewed.         Assessment & Plan:   Hyperlipidemia  Essential hypertension  Depression  Frequent headaches No Follow-up on file.

## 2015-08-13 NOTE — Assessment & Plan Note (Signed)
Deteriorated. Likely multifactorial- tension, sinus HA, migraines. Agree with neuro referral. She will call Dr. Melrose Nakayama to make appt and keep me updated.

## 2015-08-13 NOTE — Progress Notes (Signed)
Pre visit review using our clinic review tool, if applicable. No additional management support is needed unless otherwise documented below in the visit note. 

## 2015-08-15 NOTE — Telephone Encounter (Signed)
[facility] Patient Name: Krystal Walker DOB: 1955/09/23 Initial Comment Caller states she's had a stinging/burning headache all day and has taken med. Head is still pounding and eyes feel hot/red. Blood pressure is 182/111 and pulse is 84. States it hurt when she took her blood pressure. Wants to know what she can take on top of med. Nurse Assessment Nurse: Martyn Ehrich RN, Felicia Date/Time (Eastern Time): 08/15/2015 4:54:37 PM Confirm and document reason for call. If symptomatic, describe symptoms. You must click the next button to save text entered. ---PT has a HA all day - 7:30 am she took fioricet and another one at 10 am. HA is level 8-9. No fever. BP 182/111 p -84 Has the patient traveled out of the country within the last 30 days? ---No Does the patient have any new or worsening symptoms? ---Yes Will a triage be completed? ---YesRelated visit to physician within the last 2 weeks? ---Yes Does the PT have any chronic conditions? (i.e. diabetes, asthma, etc.) ---Yes List chronic conditions. ---migraines Is this a behavioral health or substance abuse call? ---No Guidelines Guideline Title Affirmed Question Affirmed Notes Headache [1] SEVERE headache (e.g., excruciating) AND [2] not improved after 2 hours of pain medicine High Blood Pressure BP # 180/110 Final Disposition User See Physician within 4 Hours (or PCP triage) Martyn Ehrich, RN, Felicia Comments no congested she wants to know if fioricet causes high blood pressure - told her the nurse does not see that as a side effect. Told her pain can raise BP referenced https://www.drugs.com/sfx/fioricet-side-effects.html and nurses own knowledge Referrals [facility] UNDECIDED Disagree/Comply: Comply  Call Id: [id number]

## 2015-08-16 NOTE — Telephone Encounter (Signed)
Unable to reach pt by phone.

## 2015-08-16 NOTE — Telephone Encounter (Signed)
Attempted to contact pt; line busy 

## 2015-08-16 NOTE — Telephone Encounter (Signed)
Please try again to contact pt.

## 2015-09-03 DIAGNOSIS — G44229 Chronic tension-type headache, not intractable: Secondary | ICD-10-CM | POA: Insufficient documentation

## 2015-09-27 ENCOUNTER — Other Ambulatory Visit: Payer: Self-pay | Admitting: Family Medicine

## 2015-11-16 ENCOUNTER — Other Ambulatory Visit: Payer: Self-pay | Admitting: Family Medicine

## 2015-11-16 DIAGNOSIS — Z1231 Encounter for screening mammogram for malignant neoplasm of breast: Secondary | ICD-10-CM

## 2015-12-10 ENCOUNTER — Other Ambulatory Visit: Payer: Self-pay | Admitting: Family Medicine

## 2015-12-10 ENCOUNTER — Ambulatory Visit
Admission: RE | Admit: 2015-12-10 | Discharge: 2015-12-10 | Disposition: A | Discharge status: Home / Self Care | Payer: 59 | Source: Ambulatory Visit | Attending: Family Medicine | Admitting: Family Medicine

## 2015-12-10 DIAGNOSIS — Z1231 Encounter for screening mammogram for malignant neoplasm of breast: Secondary | ICD-10-CM

## 2016-02-13 ENCOUNTER — Other Ambulatory Visit: Payer: Self-pay | Admitting: Family Medicine

## 2016-02-14 MED ORDER — BUTALBITAL-APAP-CAFFEINE 50-325-40 MG PO TABS: 1.0000 | ORAL_TABLET | Freq: Four times a day (QID) | ORAL | 0 refills | Status: DC | PRN

## 2016-02-14 NOTE — Telephone Encounter (Signed)
Rx called in to requested pharmacy 

## 2016-02-14 NOTE — Telephone Encounter (Signed)
Last f/u 08/2015 

## 2016-03-11 ENCOUNTER — Other Ambulatory Visit: Payer: Self-pay | Admitting: Family Medicine

## 2016-03-12 ENCOUNTER — Encounter: Payer: Self-pay | Admitting: Family Medicine

## 2016-03-13 ENCOUNTER — Other Ambulatory Visit: Payer: Self-pay | Admitting: Family Medicine

## 2016-03-13 DIAGNOSIS — K589 Irritable bowel syndrome without diarrhea: Secondary | ICD-10-CM

## 2016-03-16 ENCOUNTER — Encounter: Payer: Self-pay | Admitting: Family Medicine

## 2016-03-17 NOTE — Telephone Encounter (Signed)
Pt states she is only wanting a refill, but that it had to come directly from Dr Deborra Medina as it has never been written by her

## 2016-03-23 ENCOUNTER — Other Ambulatory Visit: Payer: Self-pay | Admitting: Family Medicine

## 2016-04-22 ENCOUNTER — Ambulatory Visit (INDEPENDENT_AMBULATORY_CARE_PROVIDER_SITE_OTHER): Payer: 59 | Admitting: *Deleted

## 2016-04-22 ENCOUNTER — Telehealth: Payer: Self-pay | Admitting: *Deleted

## 2016-04-22 DIAGNOSIS — Z23 Encounter for immunization: Secondary | ICD-10-CM | POA: Diagnosis not present

## 2016-04-22 DIAGNOSIS — Z2911 Encounter for prophylactic immunotherapy for respiratory syncytial virus (RSV): Secondary | ICD-10-CM

## 2016-04-22 NOTE — Telephone Encounter (Signed)
Pt returned my call, she had her flu shot 03/11/16, and she has checked with insurance re: coverage here at office.

## 2016-04-22 NOTE — Telephone Encounter (Signed)
I left message for pt to return call to office. She is scheduled for shingles vaccine today, and I need to verify that she hasn't had flu or PNA vaccines within the past 30 days ( if so she needs to reschedule for 30 days after that vaccine. Also need to verify that she has checked with her insurance to make sure that vaccine is covered here at office vs pharmacy.

## 2016-05-06 ENCOUNTER — Encounter: Payer: Self-pay | Admitting: Family Medicine

## 2016-07-02 ENCOUNTER — Encounter: Payer: Self-pay | Admitting: Family Medicine

## 2016-07-03 MED ORDER — TRAMADOL HCL 50 MG PO TABS: 50.0000 mg | ORAL_TABLET | Freq: Four times a day (QID) | ORAL | 0 refills | Status: DC | PRN

## 2016-07-03 NOTE — Telephone Encounter (Signed)
Tramadol called into [facility]. 

## 2016-07-03 NOTE — Telephone Encounter (Signed)
Last office visit 08/13/2015.  Last refilled 02/26/2015 for #50 with 2 refills by Dr. Lorelei Pont.  Ok to refill?

## 2016-08-09 ENCOUNTER — Encounter: Payer: Self-pay | Admitting: Emergency Medicine

## 2016-08-09 ENCOUNTER — Observation Stay
Admission: EM | Admit: 2016-08-09 | Discharge: 2016-08-11 | Disposition: A | Discharge status: Home / Self Care | Payer: 59 | Attending: Internal Medicine | Admitting: Internal Medicine

## 2016-08-09 DIAGNOSIS — K589 Irritable bowel syndrome without diarrhea: Secondary | ICD-10-CM | POA: Diagnosis not present

## 2016-08-09 DIAGNOSIS — I10 Essential (primary) hypertension: Secondary | ICD-10-CM | POA: Insufficient documentation

## 2016-08-09 DIAGNOSIS — Z91040 Latex allergy status: Secondary | ICD-10-CM | POA: Insufficient documentation

## 2016-08-09 DIAGNOSIS — F329 Major depressive disorder, single episode, unspecified: Secondary | ICD-10-CM | POA: Insufficient documentation

## 2016-08-09 DIAGNOSIS — E785 Hyperlipidemia, unspecified: Secondary | ICD-10-CM | POA: Diagnosis not present

## 2016-08-09 DIAGNOSIS — K219 Gastro-esophageal reflux disease without esophagitis: Secondary | ICD-10-CM | POA: Diagnosis not present

## 2016-08-09 DIAGNOSIS — Z791 Long term (current) use of non-steroidal anti-inflammatories (NSAID): Secondary | ICD-10-CM | POA: Diagnosis not present

## 2016-08-09 DIAGNOSIS — Z87891 Personal history of nicotine dependence: Secondary | ICD-10-CM | POA: Diagnosis not present

## 2016-08-09 DIAGNOSIS — K922 Gastrointestinal hemorrhage, unspecified: Secondary | ICD-10-CM | POA: Diagnosis present

## 2016-08-09 DIAGNOSIS — K921 Melena: Secondary | ICD-10-CM | POA: Diagnosis not present

## 2016-08-09 DIAGNOSIS — Z7982 Long term (current) use of aspirin: Secondary | ICD-10-CM | POA: Diagnosis not present

## 2016-08-09 DIAGNOSIS — K640 First degree hemorrhoids: Secondary | ICD-10-CM | POA: Insufficient documentation

## 2016-08-09 DIAGNOSIS — Z85818 Personal history of malignant neoplasm of other sites of lip, oral cavity, and pharynx: Secondary | ICD-10-CM | POA: Diagnosis not present

## 2016-08-09 DIAGNOSIS — K5730 Diverticulosis of large intestine without perforation or abscess without bleeding: Secondary | ICD-10-CM | POA: Diagnosis not present

## 2016-08-09 DIAGNOSIS — I7300 Raynaud's syndrome without gangrene: Secondary | ICD-10-CM | POA: Insufficient documentation

## 2016-08-09 DIAGNOSIS — Z885 Allergy status to narcotic agent status: Secondary | ICD-10-CM | POA: Insufficient documentation

## 2016-08-09 HISTORY — DX: Gastrointestinal hemorrhage, unspecified: K92.2

## 2016-08-09 LAB — COMPREHENSIVE METABOLIC PANEL
ALBUMIN: 4.3 g/dL (ref 3.5–5.0)
ALT: 22 U/L (ref 14–54)
ANION GAP: 8 (ref 5–15)
AST: 29 U/L (ref 15–41)
Alkaline Phosphatase: 51 U/L (ref 38–126)
BUN: 18 mg/dL (ref 6–20)
CO2: 28 mmol/L (ref 22–32)
Calcium: 9.5 mg/dL (ref 8.9–10.3)
Chloride: 102 mmol/L (ref 101–111)
Creatinine, Ser: 1.03 mg/dL — ABNORMAL HIGH (ref 0.44–1.00)
GFR calc Af Amer: >60 mL/min (ref >60)
GFR calc non Af Amer: 57 mL/min — ABNORMAL LOW (ref >60)
GLUCOSE: 96 mg/dL (ref 65–99)
POTASSIUM: 3.7 mmol/L (ref 3.5–5.1)
SODIUM: 138 mmol/L (ref 135–145)
Total Bilirubin: 0.9 mg/dL (ref 0.3–1.2)
Total Protein: 6.7 g/dL (ref 6.5–8.1)

## 2016-08-09 LAB — TYPE AND SCREEN
ABO/RH(D): A POS
Antibody Screen: NEGATIVE

## 2016-08-09 LAB — GASTROINTESTINAL PANEL BY PCR, STOOL (REPLACES STOOL CULTURE)
Adenovirus F40/41: NOT DETECTED
Astrovirus: NOT DETECTED
CAMPYLOBACTER SPECIES: NOT DETECTED
Cryptosporidium: NOT DETECTED
Cyclospora cayetanensis: NOT DETECTED
ENTEROAGGREGATIVE E COLI (EAEC): NOT DETECTED
Entamoeba histolytica: NOT DETECTED
Enteropathogenic E coli (EPEC): NOT DETECTED
Enterotoxigenic E coli (ETEC): NOT DETECTED
GIARDIA LAMBLIA: NOT DETECTED
Norovirus GI/GII: NOT DETECTED
PLESIMONAS SHIGELLOIDES: NOT DETECTED
ROTAVIRUS A: NOT DETECTED
SALMONELLA SPECIES: NOT DETECTED
SHIGELLA/ENTEROINVASIVE E COLI (EIEC): NOT DETECTED
Sapovirus (I, II, IV, and V): NOT DETECTED
Shiga like toxin producing E coli (STEC): NOT DETECTED
Vibrio cholerae: NOT DETECTED
Vibrio species: NOT DETECTED
Yersinia enterocolitica: NOT DETECTED

## 2016-08-09 LAB — CBC
HEMATOCRIT: 34.4 % — AB (ref 35.0–47.0)
HEMOGLOBIN: 12.2 g/dL (ref 12.0–16.0)
MCH: 32.7 pg (ref 26.0–34.0)
MCHC: 35.4 g/dL (ref 32.0–36.0)
MCV: 92.2 fL (ref 80.0–100.0)
Platelets: 183 10*3/uL (ref 150–440)
RBC: 3.73 MIL/uL — ABNORMAL LOW (ref 3.80–5.20)
RDW: 12.7 % (ref 11.5–14.5)
WBC: 4.3 10*3/uL (ref 3.6–11.0)

## 2016-08-09 LAB — C DIFFICILE QUICK SCREEN W PCR REFLEX
C DIFFICILE (CDIFF) INTERP: NOT DETECTED
C DIFFICILE (CDIFF) TOXIN: NEGATIVE
C Diff antigen: NEGATIVE

## 2016-08-09 LAB — RAPID HIV SCREEN (HIV 1/2 AB+AG)
HIV 1/2 Antibodies: NONREACTIVE
HIV-1 P24 Antigen - HIV24: NONREACTIVE

## 2016-08-09 MED ORDER — HYDROCHLOROTHIAZIDE 25 MG PO TABS
12.5000 mg | ORAL_TABLET | Freq: Every day | ORAL | Status: DC
  Administered 2016-08-10 – 2016-08-11 (×2): 12.5 mg via ORAL
  Filled 2016-08-09 (×2): qty 1

## 2016-08-09 MED ORDER — MELOXICAM 7.5 MG PO TABS
15.0000 mg | ORAL_TABLET | Freq: Every day | ORAL | Status: DC
  Administered 2016-08-10: 15 mg via ORAL
  Filled 2016-08-09 (×2): qty 2

## 2016-08-09 MED ORDER — PANTOPRAZOLE SODIUM 40 MG IV SOLR
40.0000 mg | Freq: Two times a day (BID) | INTRAVENOUS | Status: DC
  Administered 2016-08-09 – 2016-08-10 (×3): 40 mg via INTRAVENOUS
  Filled 2016-08-09 (×3): qty 40

## 2016-08-09 MED ORDER — BUTALBITAL-APAP-CAFFEINE 50-325-40 MG PO TABS
1.0000 | ORAL_TABLET | Freq: Four times a day (QID) | ORAL | Status: DC | PRN
  Administered 2016-08-09 (×2): 1 via ORAL
  Filled 2016-08-09: qty 2
  Filled 2016-08-09: qty 1

## 2016-08-09 MED ORDER — SODIUM CHLORIDE 0.9 % IV SOLN
INTRAVENOUS | Status: DC
  Administered 2016-08-09 – 2016-08-11 (×5): via INTRAVENOUS

## 2016-08-09 MED ORDER — PANTOPRAZOLE SODIUM 40 MG PO TBEC: 40.0000 mg | DELAYED_RELEASE_TABLET | Freq: Every day | ORAL | Status: DC

## 2016-08-09 MED ORDER — ADULT MULTIVITAMIN W/MINERALS CH
1.0000 | ORAL_TABLET | Freq: Every day | ORAL | Status: DC
  Administered 2016-08-10 – 2016-08-11 (×2): 1 via ORAL
  Filled 2016-08-09 (×2): qty 1

## 2016-08-09 MED ORDER — DOCUSATE SODIUM 100 MG PO CAPS: 100.0000 mg | ORAL_CAPSULE | Freq: Two times a day (BID) | ORAL | Status: DC | PRN

## 2016-08-09 MED ORDER — RISAQUAD PO CAPS
1.0000 | ORAL_CAPSULE | Freq: Every day | ORAL | Status: DC
  Administered 2016-08-09 – 2016-08-10 (×2): 1 via ORAL
  Filled 2016-08-09 (×2): qty 1

## 2016-08-09 MED ORDER — CALCIUM CARBONATE-VITAMIN D 500-200 MG-UNIT PO TABS
1.0000 | ORAL_TABLET | Freq: Every day | ORAL | Status: DC
  Administered 2016-08-10 – 2016-08-11 (×2): 1 via ORAL
  Filled 2016-08-09 (×2): qty 1

## 2016-08-09 MED ORDER — TRAMADOL HCL 50 MG PO TABS: 50.0000 mg | ORAL_TABLET | Freq: Four times a day (QID) | ORAL | Status: DC | PRN

## 2016-08-09 MED ORDER — PROPRANOLOL HCL 20 MG PO TABS
20.0000 mg | ORAL_TABLET | Freq: Two times a day (BID) | ORAL | Status: DC
  Administered 2016-08-09 – 2016-08-11 (×4): 20 mg via ORAL
  Filled 2016-08-09 (×4): qty 1

## 2016-08-09 MED ORDER — PRAVASTATIN SODIUM 20 MG PO TABS
10.0000 mg | ORAL_TABLET | Freq: Every day | ORAL | Status: DC
  Administered 2016-08-09 – 2016-08-10 (×2): 10 mg via ORAL
  Filled 2016-08-09 (×2): qty 1

## 2016-08-09 MED ORDER — RISAQUAD PO CAPS: 1.0000 | ORAL_CAPSULE | Freq: Every day | ORAL | Status: DC

## 2016-08-09 MED ORDER — ALIGN 4 MG PO CAPS: 4.0000 mg | ORAL_CAPSULE | Freq: Every day | ORAL | Status: DC

## 2016-08-09 NOTE — ED Provider Notes (Signed)
[facility] Department Provider Note  ____________________________________________   First MD Initiated Contact with Patient 08/09/16 [phone]     (approximate)  I have reviewed the triage vital signs and the nursing notes.   HISTORY  Chief Complaint Rectal Bleeding   HPI Krystal Walker is a 61 y.o. 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 She says that starting yesterday she had sinus pressure at work. She said that she also started have diarrhea at work and then this morning he has had 3 episodes of black tarry stool with blood around the stool. She says that she has had intermittent abdominal cramping but denies any pain at this time. Denies any fever. Denies any recent antibiotics. Says that she has a history of hemorrhoids but no known history of diverticulosis. Does not take any blood thinners.   Past Medical History:  Diagnosis Date  . Bowel trouble 1997  . Breast screening, unspecified   . Depression   . GERD (gastroesophageal reflux disease)   . Hyperlipidemia   . Hypertension   . IBS (irritable bowel syndrome)   . Mammographic microcalcification   . Osteoporosis   . Personal history of tobacco use, presenting hazards to health   . Radiation    for adenoid cystic carcinoma  . Raynaud disease   . Raynaud phenomenon 1982  . 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 Special screening for malignant neoplasms, colon     Patient Active Problem List   Diagnosis Date Noted  . Frequent headaches 08/13/2015  . Work-related stress 08/13/2015  . Fibrocystic disease of breast 09/07/2012  . Raynaud phenomenon 04/12/2012  . IBS (irritable bowel syndrome) 04/12/2012  . Hypertension   . Hyperlipidemia   . Depression   . GERD (gastroesophageal reflux disease)     Past Surgical History:    Procedure Laterality Date  . ABDOMINAL HYSTERECTOMY  2004   partial  . BREAST BIOPSY Left 02-23-12   w/clip - benign  . COLONOSCOPY  2010  . PAROTIDECTOMY Right    adenoid cystic carcinoma  . SALIVARY GLAND SURGERY  1999    Prior to Admission medications   Medication Sig Start Date End Date Taking? Authorizing Provider  butalbital-acetaminophen-caffeine (FIORICET) 50-325-40 MG tablet Take 1-2 tablets by mouth every 6 (six) hours as needed for headache. 02/14/16 02/13/17  Lucille Passy, MD  Calcium Carbonate-Vitamin D (CALCIUM 600+D) 600-400 MG-UNIT tablet Take 1 tablet by mouth daily.    Historical Provider, MD  meloxicam (MOBIC) 15 MG tablet TAKE 1 TABLET BY MOUTH  DAILY 03/12/16   Lucille Passy, MD  Multiple Vitamin (MULTIVITAMIN WITH MINERALS) TABS tablet Take 1 tablet by mouth daily.    Historical Provider, MD  omeprazole (PRILOSEC) 40 MG capsule TAKE 1 CAPSULE BY MOUTH  EVERY DAY 03/12/16   Lucille Passy, MD  pravastatin (PRAVACHOL) 10 MG tablet TAKE 1 TABLET BY MOUTH  DAILY 03/24/16   Lucille Passy, MD  topiramate (TOPAMAX) 25 MG tablet Take 25 mg by mouth at bedtime.    Historical Provider, MD  traMADol (ULTRAM) 50 MG tablet Take 1 tablet (50 mg total) by mouth every 6 (six) hours as needed for moderate pain. 07/03/16   Lucille Passy, MD    Allergies Latex; Morphine and related; and Nickel  Family History  Problem Relation Age  of Onset  . Arrhythmia Mother     pacemaker  . Fainting Mother   . Hypertension Mother   . Hyperlipidemia Mother   . Breast cancer Neg Hx     Social History Social History  Substance Use Topics  . Smoking status: Former Smoker    Packs/day: 0.50    Years: 15.00    Types: Cigarettes  . Smokeless tobacco: Never Used     Comment: quit 1986  . Alcohol use No    Review of Systems Constitutional: No fever/chills Eyes: No visual changes. ENT: No sore throat. Cardiovascular: Denies chest pain. Respiratory: Denies shortness of breath. Gastrointestinal:  No abdominal pain.  No nausea, no vomiting.  No diarrhea.  No constipation. Genitourinary: Negative for dysuria. Musculoskeletal: Negative for back pain. Skin: Negative for rash. Neurological: Negative for headaches, focal weakness or numbness.  10-point ROS otherwise negative.  ____________________________________________   PHYSICAL EXAM:  VITAL SIGNS: ED Triage Vitals  Enc Vitals Group     BP 08/09/16 0824 (!) 149/98     Pulse Rate 08/09/16 0824 67     Resp 08/09/16 0824 16     Temp 08/09/16 0824 98.1 F (36.7 C)     Temp Source 08/09/16 0824 Oral     SpO2 08/09/16 0824 98 %     Weight 08/09/16 0809 140 lb (63.5 kg)     Height 08/09/16 0809 5\' 4"  (1.626 m)     Head Circumference --      Peak Flow --      Pain Score 08/09/16 0807 0     Pain Loc --      Pain Edu? --      Excl. in [place]? --     Constitutional: Alert and oriented. Well appearing and in no acute distress. Eyes: Conjunctivae are normal. PERRL. EOMI. Head: Atraumatic. Nose: No congestion/rhinnorhea. Mouth/Throat: Mucous membranes are moist.  Neck: No stridor.   Cardiovascular: Normal rate, regular rhythm. Grossly normal heart sounds.  Respiratory: Normal respiratory effort.  No retractions. Lungs CTAB. Gastrointestinal: Soft and nontender. No distention. External rectal exam done just with visualization and the patient does have several non-gorged hemorrhoids. She has a stool sample at the bedside in a Ziploc bag which is a black tarry, small stool, with surrounding maroon blood on the tissue which holds it. Musculoskeletal: No lower extremity tenderness nor edema.  No joint effusions. Neurologic:  Normal speech and language. No gross focal neurologic deficits are appreciated.  Skin:  Skin is warm, dry and intact. No rash noted. Psychiatric: Mood and affect are normal. Speech and behavior are normal.  ____________________________________________   LABS (all labs ordered are listed, but only abnormal results  are displayed)  Labs Reviewed  COMPREHENSIVE METABOLIC PANEL - Abnormal; Notable for the following:       Result Value   Creatinine, Ser 1.03 (*)    GFR calc non Af Amer 57 (*)    All other components within normal limits  CBC - Abnormal; Notable for the following:    RBC 3.73 (*)    HCT 34.4 (*)    All other components within normal limits  C DIFFICILE QUICK SCREEN W PCR REFLEX  GASTROINTESTINAL PANEL BY PCR, STOOL (REPLACES STOOL CULTURE)  TYPE AND SCREEN   ____________________________________________  EKG   ____________________________________________  RADIOLOGY   ____________________________________________   PROCEDURES  Procedure(s) performed:   Procedures  Critical Care performed:   ____________________________________________   INITIAL IMPRESSION / ASSESSMENT AND PLAN / ED COURSE  Pertinent labs & imaging results that were available during my care of the patient were reviewed by me and considered in my medical decision making (see chart for details).  ----------------------------------------- 9:46 AM on 08/09/2016 -----------------------------------------  Patient with 3 episodes of black tarry stool here in the emergency department. Will be admitted to the hospital for further observation and treatment. Signed out to Dr. Earleen Newport.  He is to wear me for admission. Also aware of request for stool sample and have placed into a pole.      ____________________________________________   FINAL CLINICAL IMPRESSION(S) / ED DIAGNOSES  Acute GI bleeding.    NEW MEDICATIONS STARTED DURING THIS VISIT:  New Prescriptions   No medications on file     Note:  This document was prepared using Dragon voice recognition software and may include unintentional dictation errors.    Orbie Pyo, MD 08/09/16 [phone]

## 2016-08-09 NOTE — H&P (Signed)
[facility] NAME: Krystal Walker    MR#:  [id number]  DATE OF BIRTH:  06/04/1956  DATE OF ADMISSION:  08/09/2016  PRIMARY CARE PHYSICIAN: Arnette Norris, MD   REQUESTING/REFERRING PHYSICIAN: Schaevitz  CHIEF COMPLAINT:   Chief Complaint  Patient presents with  . Rectal Bleeding    HISTORY OF PRESENT ILLNESS: Krystal Walker  is a 61 y.o. female with a known history of Irritable bowel syndrome, depression, hyperlipidemia, hypertension, Raynaud's disease, saliva gland cancer- follows with GI clinic. Yesterday she had 3 episodes of loss of control on her bowels and having loose stool. Today morning while she was getting ready to go to work and she again had loss of control of her bowels and she had to rush to the bathroom, she noted her stool is black-colored and having some red blood on the edges and it was loose. She had 3 episodes like this today morning so concerned with this she came to emergency room. She denies any abdominal cramping or associated nausea or vomiting with this. She has chronic complain of bloating and gas but there is no change in that recently. Her hemoglobin was noted more than 12, almost at her baseline so she was given to keep in hospital on observation.  PAST MEDICAL HISTORY:   Past Medical History:  Diagnosis Date  . Bowel trouble 1997  . Breast screening, unspecified   . Depression   . GERD (gastroesophageal reflux disease)   . Hyperlipidemia   . Hypertension   . IBS (irritable bowel syndrome)   . Mammographic microcalcification   . Osteoporosis   . Personal history of tobacco use, presenting hazards to health   . Radiation    for adenoid cystic carcinoma  . Raynaud disease   . Raynaud phenomenon 1982  . 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 Special screening for malignant neoplasms, colon     PAST SURGICAL HISTORY: Past Surgical History:  Procedure Laterality Date  .  ABDOMINAL HYSTERECTOMY  2004   partial  . BREAST BIOPSY Left 02-23-12   w/clip - benign  . COLONOSCOPY  2010  . PAROTIDECTOMY Right    adenoid cystic carcinoma  . SALIVARY GLAND SURGERY  1999    SOCIAL HISTORY:  Social History  Substance Use Topics  . Smoking status: Former Smoker    Packs/day: 0.50    Years: 15.00    Types: Cigarettes  . Smokeless tobacco: Never Used     Comment: quit 1986  . Alcohol use No    FAMILY HISTORY:  Family History  Problem Relation Age of Onset  . Arrhythmia Mother     pacemaker  . Fainting Mother   . Hypertension Mother   . Hyperlipidemia Mother   . Breast cancer Neg Hx     DRUG ALLERGIES:  Allergies  Allergen Reactions  . Latex Hives and Itching  . Morphine And Related Nausea And Vomiting  . Nickel     REVIEW OF SYSTEMS:   CONSTITUTIONAL: No fever, fatigue or weakness.  EYES: No blurred or double vision.  EARS, NOSE, AND THROAT: No tinnitus or ear pain.  RESPIRATORY: No cough, shortness of breath, wheezing or hemoptysis.  CARDIOVASCULAR: No chest pain, orthopnea, edema.  GASTROINTESTINAL: No nausea, vomiting, positive for diarrhea , no abdominal pain.  GENITOURINARY: No dysuria, hematuria.  ENDOCRINE: No polyuria, nocturia,  HEMATOLOGY: No anemia, easy bruising or bleeding SKIN: No rash or lesion. MUSCULOSKELETAL: No  joint pain or arthritis.   NEUROLOGIC: No tingling, numbness, weakness.  PSYCHIATRY: No anxiety or depression.   MEDICATIONS AT HOME:  Prior to Admission medications   Medication Sig Start Date End Date Taking? Authorizing Provider  aspirin EC 81 MG tablet Take 81 mg by mouth daily.   Yes Historical Provider, MD  butalbital-acetaminophen-caffeine (FIORICET) 50-325-40 MG tablet Take 1-2 tablets by mouth every 6 (six) hours as needed for headache. 02/14/16 02/13/17 Yes Lucille Passy, MD  Calcium Carbonate-Vitamin D (CALCIUM 600+D) 600-400 MG-UNIT tablet Take 1 tablet by mouth daily.   Yes Historical Provider, MD   hydrochlorothiazide (HYDRODIURIL) 12.5 MG tablet Take 12.5 mg by mouth daily. 08/05/16 11/03/16 Yes Historical Provider, MD  meloxicam (MOBIC) 15 MG tablet TAKE 1 TABLET BY MOUTH  DAILY 03/12/16  Yes Lucille Passy, MD  Multiple Vitamin (MULTIVITAMIN WITH MINERALS) TABS tablet Take 1 tablet by mouth daily.   Yes Historical Provider, MD  omeprazole (PRILOSEC) 40 MG capsule TAKE 1 CAPSULE BY MOUTH  EVERY DAY 03/12/16  Yes Lucille Passy, MD  pravastatin (PRAVACHOL) 10 MG tablet TAKE 1 TABLET BY MOUTH  DAILY 03/24/16  Yes Lucille Passy, MD  Probiotic Product (ALIGN) 4 MG CAPS Take 4 mg by mouth daily.   Yes Historical Provider, MD  propranolol (INDERAL) 20 MG tablet Take 20 mg by mouth 2 (two) times daily. 05/14/16  Yes Historical Provider, MD  traMADol (ULTRAM) 50 MG tablet Take 1 tablet (50 mg total) by mouth every 6 (six) hours as needed for moderate pain. 07/03/16  Yes Lucille Passy, MD      PHYSICAL EXAMINATION:   VITAL SIGNS: Blood pressure (!) 149/98, pulse 67, temperature 98.1 F (36.7 C), temperature source Oral, resp. rate 16, height 5\' 4"  (1.626 m), weight 63.5 kg (140 lb), SpO2 98 %.  GENERAL:  61 y.o.-year-old patient lying in the bed with no acute distress.  EYES: Pupils equal, round, reactive to light and accommodation. No scleral icterus. Extraocular muscles intact.  HEENT: Head atraumatic, normocephalic. Oropharynx and nasopharynx clear.  NECK:  Supple, no jugular venous distention. No thyroid enlargement, no tenderness.  LUNGS: Normal breath sounds bilaterally, no wheezing, rales,rhonchi or crepitation. No use of accessory muscles of respiration.  CARDIOVASCULAR: S1, S2 normal. No murmurs, rubs, or gallops.  ABDOMEN: Soft, mild left lower quadrant tender, nondistended. Bowel sounds present. No organomegaly or mass.  EXTREMITIES: No pedal edema, cyanosis, or clubbing.  NEUROLOGIC: Cranial nerves II through XII are intact. Muscle strength 5/5 in all extremities. Sensation intact. Gait not  checked.  PSYCHIATRIC: The patient is alert and oriented x 3.  SKIN: No obvious rash, lesion, or ulcer.   LABORATORY PANEL:   CBC  Recent Labs Lab 08/09/16 0825  WBC 4.3  HGB 12.2  HCT 34.4*  PLT 183  MCV 92.2  MCH 32.7  MCHC 35.4  RDW 12.7   ------------------------------------------------------------------------------------------------------------------  Chemistries   Recent Labs Lab 08/09/16 0825  NA 138  K 3.7  CL 102  CO2 28  GLUCOSE 96  BUN 18  CREATININE 1.03*  CALCIUM 9.5  AST 29  ALT 22  ALKPHOS 51  BILITOT 0.9   ------------------------------------------------------------------------------------------------------------------ estimated creatinine clearance is 49.5 mL/min (by C-G formula based on SCr of 1.03 mg/dL (H)). ------------------------------------------------------------------------------------------------------------------ No results for input(s): TSH, T4TOTAL, T3FREE, THYROIDAB in the last 72 hours.  Invalid input(s): FREET3   Coagulation profile No results for input(s): INR, PROTIME in the last 168 hours. ------------------------------------------------------------------------------------------------------------------- No results for input(s): DDIMER  in the last 72 hours. -------------------------------------------------------------------------------------------------------------------  Cardiac Enzymes No results for input(s): CKMB, TROPONINI, MYOGLOBIN in the last 168 hours.  Invalid input(s): CK ------------------------------------------------------------------------------------------------------------------ Invalid input(s): POCBNP  ---------------------------------------------------------------------------------------------------------------  Urinalysis No results found for: COLORURINE, APPEARANCEUR, LABSPEC, PHURINE, GLUCOSEU, HGBUR, BILIRUBINUR, KETONESUR, PROTEINUR, UROBILINOGEN, NITRITE, LEUKOCYTESUR   RADIOLOGY: No  results found.  EKG: Orders placed or performed during the hospital encounter of 08/06/15  . EKG 12-Lead  . EKG 12-Lead  . ED EKG  . ED EKG  . EKG    IMPRESSION AND PLAN:  * GI bleed   Likely secondary to her IBS, she had been following up with GI clinic in the past.   Currently hemoglobin is stable so does not require transfusion, will monitor hemoglobin in hospital and hold anticoagulants and aspirin in hospital.   GI consult to decide further plan.   Continue oral PPIs.  * Irritable bowel syndrome   She takes fiber Candiss Norse diet has advised by GI, will continue for now.  * Hypertension   Continue hydrochlorothiazide and pantoprazole.  * Hyperlipidemia   Continue pravastatin.   All the records are reviewed and case discussed with ED provider. Management plans discussed with the patient, family and they are in agreement.  CODE STATUS: Full code Code Status History    This patient does not have a recorded code status. Please follow your organizational policy for patients in this situation.    Advance Directive Documentation   [place] Most Recent Value  Type of Advance Directive  Living will  Pre-existing out of facility DNR order (yellow form or pink MOST form)  No data  "MOST" Form in Place?  No data       TOTAL TIME TAKING CARE OF THIS PATIENT: 50 minutes.    Vaughan Basta M.D on 08/09/2016   Between 7am to 6pm - Pager - [phone]  After 6pm go to [email or web address] - password [email or web address]  Office  [phone]  CC: Primary care physician; Arnette Norris, MD   Note: This dictation was prepared with Dragon dictation along with smaller phrase technology. Any transcriptional errors that result from this process are unintentional.

## 2016-08-09 NOTE — ED Triage Notes (Signed)
Pt states she has hx of IBS, started having diarrhea yesterday, noticed blood in toilet several times. Pt states she has had 4-5 BMs today. Pt states her stools have been black and tarry. Denies any pain.

## 2016-08-10 ENCOUNTER — Encounter
Admission: EM | Disposition: A | Discharge status: Home / Self Care | Payer: Self-pay | Source: Home / Self Care | Attending: Emergency Medicine

## 2016-08-10 ENCOUNTER — Observation Stay: Payer: 59 | Admitting: Anesthesiology

## 2016-08-10 HISTORY — PX: ESOPHAGOGASTRODUODENOSCOPY (EGD) WITH PROPOFOL: SHX5813

## 2016-08-10 LAB — BASIC METABOLIC PANEL
ANION GAP: 8 (ref 5–15)
BUN: 11 mg/dL (ref 6–20)
CO2: 26 mmol/L (ref 22–32)
Calcium: 8.9 mg/dL (ref 8.9–10.3)
Chloride: 108 mmol/L (ref 101–111)
Creatinine, Ser: 0.74 mg/dL (ref 0.44–1.00)
GFR calc Af Amer: >60 mL/min (ref >60)
GLUCOSE: 96 mg/dL (ref 65–99)
POTASSIUM: 3.5 mmol/L (ref 3.5–5.1)
Sodium: 142 mmol/L (ref 135–145)

## 2016-08-10 LAB — CBC
HEMATOCRIT: 31.7 % — AB (ref 35.0–47.0)
HEMOGLOBIN: 10.9 g/dL — AB (ref 12.0–16.0)
MCH: 32.1 pg (ref 26.0–34.0)
MCHC: 34.4 g/dL (ref 32.0–36.0)
MCV: 93.1 fL (ref 80.0–100.0)
Platelets: 149 10*3/uL — ABNORMAL LOW (ref 150–440)
RBC: 3.41 MIL/uL — ABNORMAL LOW (ref 3.80–5.20)
RDW: 12.5 % (ref 11.5–14.5)
WBC: 3.9 10*3/uL (ref 3.6–11.0)

## 2016-08-10 SURGERY — ESOPHAGOGASTRODUODENOSCOPY (EGD) WITH PROPOFOL
Anesthesia: General

## 2016-08-10 MED ORDER — LIDOCAINE HCL (CARDIAC) 20 MG/ML IV SOLN
INTRAVENOUS | Status: DC | PRN
  Administered 2016-08-10: 60 mg via INTRAVENOUS

## 2016-08-10 MED ORDER — MIDAZOLAM HCL 2 MG/2ML IJ SOLN
INTRAMUSCULAR | Status: AC
  Filled 2016-08-10: qty 2

## 2016-08-10 MED ORDER — PEG 3350-KCL-NA BICARB-NACL 420 G PO SOLR
4000.0000 mL | Freq: Once | ORAL | Status: AC
  Administered 2016-08-10: 16:00:00 4000 mL via ORAL
  Filled 2016-08-10: qty 4000

## 2016-08-10 MED ORDER — PROPOFOL 500 MG/50ML IV EMUL
INTRAVENOUS | Status: AC
  Filled 2016-08-10: qty 50

## 2016-08-10 MED ORDER — LIDOCAINE HCL (PF) 2 % IJ SOLN
INTRAMUSCULAR | Status: AC
  Filled 2016-08-10: qty 2

## 2016-08-10 MED ORDER — PROPOFOL 500 MG/50ML IV EMUL
INTRAVENOUS | Status: DC | PRN
  Administered 2016-08-10: 160 ug/kg/min via INTRAVENOUS

## 2016-08-10 MED ORDER — SODIUM CHLORIDE 0.9 % IV SOLN: INTRAVENOUS | Status: DC

## 2016-08-10 MED ORDER — PROPOFOL 10 MG/ML IV BOLUS
INTRAVENOUS | Status: DC | PRN
  Administered 2016-08-10: 30 mg via INTRAVENOUS

## 2016-08-10 NOTE — Op Note (Signed)
[facility] Gastroenterology Patient Name: Krystal Walker Procedure Date: 08/10/2016 8:20 AM MRN: [id number] Account #: [id number] Date of Birth: [birth year] Admit Type: Inpatient Age: 61 Room: [facility] ENDO ROOM 4 Gender: Female Note Status: Finalized Procedure:            Upper GI endoscopy Indications:          Melena Providers:            Lear Ng, MD, Seeplaputhur G. Jamal Collin, MD Medicines:            Propofol per Anesthesia, Monitored Anesthesia Care Complications:        No immediate complications. Procedure:            Pre-Anesthesia Assessment:                       - Prior to the procedure, a History and Physical was                        performed, and patient medications and allergies were                        reviewed. The patient's tolerance of previous                        anesthesia was also reviewed. The risks and benefits of                        the procedure and the sedation options and risks were                        discussed with the patient. All questions were                        answered, and informed consent was obtained. Prior                        Anticoagulants: The patient has taken no previous                        anticoagulant or antiplatelet agents. ASA Grade                        Assessment: III - A patient with severe systemic                        disease. After reviewing the risks and benefits, the                        patient was deemed in satisfactory condition to undergo                        the procedure.                       After obtaining informed consent, the endoscope was                        passed under direct vision. Throughout the procedure,  the patient's blood pressure, pulse, and oxygen                        saturations were monitored continuously. The                        Colonoscope was introduced through the mouth, and                        advanced to the  second part of duodenum. The upper GI                        endoscopy was accomplished without difficulty. The                        patient tolerated the procedure well. Findings:      The examined esophagus was normal.      The Z-line was regular and was found 40 cm from the incisors.      The entire examined stomach was normal.      The examined duodenum was normal. Impression:           - Normal esophagus.                       - Z-line regular, 40 cm from the incisors.                       - Normal stomach.                       - Normal examined duodenum.                       - No specimens collected. Recommendation:       - Patient has a contact number available for                        emergencies. The signs and symptoms of potential                        delayed complications were discussed with the patient.                        Return to normal activities tomorrow. Written discharge                        instructions were provided to the patient.                       - Clear liquid diet.                       - Perform a colonoscopy tomorrow. Procedure Code(s):    --- Professional ---                       [phone], Esophagogastroduodenoscopy, flexible, transoral;                        diagnostic, including collection of specimen(s) by                        brushing  or washing, when performed (separate procedure) Diagnosis Code(s):    --- Professional ---                       K92.1, Melena (includes Hematochezia) CPT copyright 2016 American Medical Association. All rights reserved. The codes documented in this report are preliminary and upon coder review may  be revised to meet current compliance requirements. Lear Ng, MD 08/10/2016 8:52:08 AM This report has been signed electronically. Christene Lye, MD Number of Addenda: 0 Note Initiated On: 08/10/2016 8:20 AM      [facility]

## 2016-08-10 NOTE — Anesthesia Post-op Follow-up Note (Cosign Needed)
Anesthesia QCDR form completed.        

## 2016-08-10 NOTE — Consult Note (Signed)
Referring Provider: Dr. Posey Pronto Primary Care Physician:  Arnette Norris, MD Primary Gastroenterologist:  Althia Forts  Reason for Consultation:  Melena  HPI: Krystal Walker is a 61 y.o. female with acute onset of black stools several times yesterday without associated abdominal pain, nausea, vomiting, hematemesis, or dizziness. Denies red blood per rectum. Has mixed-type IBS and reports that she has been more constipated lately. Reports a normal colonoscopy 9 years ago. Has taken Meloxicam for 2 years and is on a baby aspirin. Hgb 12.2 on admit yesterday and now 10.9. Hemodynamically stable. Denies any previous history of black stools.  Past Medical History:  Diagnosis Date  . Bowel trouble 1997  . Breast screening, unspecified   . Depression   . GERD (gastroesophageal reflux disease)   . Hyperlipidemia   . Hypertension   . IBS (irritable bowel syndrome)   . Mammographic microcalcification   . Osteoporosis   . Personal history of tobacco use, presenting hazards to health   . Radiation    for adenoid cystic carcinoma  . Raynaud disease   . Raynaud phenomenon 1982  . 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 Special screening for malignant neoplasms, colon     Past Surgical History:  Procedure Laterality Date  . ABDOMINAL HYSTERECTOMY  2004   partial  . BREAST BIOPSY Left 02-23-12   w/clip - benign  . COLONOSCOPY  2010  . PAROTIDECTOMY Right    adenoid cystic carcinoma  . SALIVARY GLAND SURGERY  1999    Prior to Admission medications   Medication Sig Start Date End Date Taking? Authorizing Provider  aspirin EC 81 MG tablet Take 81 mg by mouth daily.   Yes Historical Provider, MD  butalbital-acetaminophen-caffeine (FIORICET) 50-325-40 MG tablet Take 1-2 tablets by mouth every 6 (six) hours as needed for headache. 02/14/16 02/13/17 Yes Lucille Passy, MD  Calcium Carbonate-Vitamin D (CALCIUM 600+D) 600-400 MG-UNIT tablet Take 1 tablet by mouth daily.   Yes  Historical Provider, MD  hydrochlorothiazide (HYDRODIURIL) 12.5 MG tablet Take 12.5 mg by mouth daily. 08/05/16 11/03/16 Yes Historical Provider, MD  meloxicam (MOBIC) 15 MG tablet TAKE 1 TABLET BY MOUTH  DAILY 03/12/16  Yes Lucille Passy, MD  Multiple Vitamin (MULTIVITAMIN WITH MINERALS) TABS tablet Take 1 tablet by mouth daily.   Yes Historical Provider, MD  omeprazole (PRILOSEC) 40 MG capsule TAKE 1 CAPSULE BY MOUTH  EVERY DAY 03/12/16  Yes Lucille Passy, MD  pravastatin (PRAVACHOL) 10 MG tablet TAKE 1 TABLET BY MOUTH  DAILY 03/24/16  Yes Lucille Passy, MD  Probiotic Product (ALIGN) 4 MG CAPS Take 4 mg by mouth daily.   Yes Historical Provider, MD  propranolol (INDERAL) 20 MG tablet Take 20 mg by mouth 2 (two) times daily. 05/14/16  Yes Historical Provider, MD  traMADol (ULTRAM) 50 MG tablet Take 1 tablet (50 mg total) by mouth every 6 (six) hours as needed for moderate pain. 07/03/16  Yes Lucille Passy, MD    Scheduled Meds: . [MAR Hold] acidophilus  1 capsule Oral QHS  . [MAR Hold] calcium-vitamin D  1 tablet Oral Daily  . [MAR Hold] hydrochlorothiazide  12.5 mg Oral Daily  . [MAR Hold] meloxicam  15 mg Oral Daily  . [MAR Hold] multivitamin with minerals  1 tablet Oral Daily  . [MAR Hold] pantoprazole (PROTONIX) IV  40 mg Intravenous Q12H  . [MAR Hold] pravastatin  10 mg Oral Daily  . [MAR Hold] propranolol  20  mg Oral BID   Continuous Infusions: . sodium chloride 75 mL/hr at 08/10/16 0101   PRN Meds:.[MAR Hold] butalbital-acetaminophen-caffeine, [MAR Hold] docusate sodium, [MAR Hold] traMADol  Allergies as of 08/09/2016 - Review Complete 08/09/2016  Allergen Reaction Noted  . Latex Hives and Itching 12/08/2011  . Morphine and related Nausea And Vomiting 11/06/2011  . Nickel  08/09/2016    Family History  Problem Relation Age of Onset  . Arrhythmia Mother     pacemaker  . Fainting Mother   . Hypertension Mother   . Hyperlipidemia Mother   . Breast cancer Neg Hx     Social  History   Social History  . Marital status: Single    Spouse name: N/A  . Number of children: N/A  . Years of education: N/A   Occupational History  . Not on file.   Social History Main Topics  . Smoking status: Former Smoker    Packs/day: 1.00    Years: 15.00    Types: Cigarettes  . Smokeless tobacco: Never Used     Comment: quit 1986  . Alcohol use No  . Drug use: No  . Sexual activity: Not on file   Other Topics Concern  . Not on file   Social History Narrative  . No narrative on file    Review of Systems: All negative except as stated above in HPI.  Physical Exam: Vital signs: Vitals:   08/09/16 2037 08/10/16 0435  BP: (!) 123/91 106/69  Pulse: 73 61  Resp: 20 18  Temp: 97.6 F (36.4 C) 97.5 F (36.4 C)   Last BM Date: 08/09/16 General:   Alert,  Well-developed, well-nourished, pleasant and cooperative in NAD, elderly HEENT: anicteric sclera, oropharynx clear Lungs:  Clear throughout to auscultation.   No wheezes, crackles, or rhonchi. No acute distress. Heart:  Regular rate and rhythm; no murmurs, clicks, rubs,  or gallops. Abdomen: soft, nontender, nondistended, +BS  Rectal:  Deferred Ext: no edema  GI:  Lab Results:  Recent Labs  08/09/16 0825 08/10/16 0358  WBC 4.3 3.9  HGB 12.2 10.9*  HCT 34.4* 31.7*  PLT 183 149*   BMET  Recent Labs  08/09/16 0825 08/10/16 0358  NA 138 142  K 3.7 3.5  CL 102 108  CO2 28 26  GLUCOSE 96 96  BUN 18 11  CREATININE 1.03* 0.74  CALCIUM 9.5 8.9   LFT  Recent Labs  08/09/16 0825  PROT 6.7  ALBUMIN 4.3  AST 29  ALT 22  ALKPHOS 51  BILITOT 0.9   PT/INR No results for input(s): LABPROT, INR in the last 72 hours.   Studies/Results: No results found.  Impression/Plan: Melena in the setting of chronic NSAID use concerning for a peptic ulcer bleed. EGD this morning. Continue Protonix IV. NPO for EGD. Supportive care.    LOS: 0 days   [place] C.  08/10/2016, 8:34 AM

## 2016-08-10 NOTE — Interval H&P Note (Signed)
History and Physical Interval Note:  08/10/2016 8:38 AM  Krystal Walker  has presented today for surgery, with the diagnosis of na  The various methods of treatment have been discussed with the patient and family. After consideration of risks, benefits and other options for treatment, the patient has consented to  Procedure(s): ESOPHAGOGASTRODUODENOSCOPY (EGD) WITH PROPOFOL (N/A) as a surgical intervention .  The patient's history has been reviewed, patient examined, no change in status, stable for surgery.  I have reviewed the patient's chart and labs.  Questions were answered to the patient's satisfaction.     [place] C.

## 2016-08-10 NOTE — Anesthesia Postprocedure Evaluation (Signed)
Anesthesia Post Note  Patient: Krystal Walker  Procedure(s) Performed: Procedure(s) (LRB): ESOPHAGOGASTRODUODENOSCOPY (EGD) WITH PROPOFOL (N/A)  Patient location during evaluation: Endoscopy Anesthesia Type: General Level of consciousness: awake and alert Pain management: pain level controlled Vital Signs Assessment: post-procedure vital signs reviewed and stable Respiratory status: spontaneous breathing, nonlabored ventilation and respiratory function stable Cardiovascular status: blood pressure returned to baseline and stable Postop Assessment: no signs of nausea or vomiting Anesthetic complications: no     Last Vitals:  Vitals:   08/10/16 0435 08/10/16 0853  BP: 106/69 92/61  Pulse: 61 72  Resp: 18 20  Temp: 36.4 C (P) 36.7 C    Last Pain:  Vitals:   08/10/16 0853  TempSrc: Tympanic  PainSc:                  Krystal Walker

## 2016-08-10 NOTE — Progress Notes (Signed)
[place] at [facility]                                                                                                                                                                                  Patient Demographics   Krystal Walker, is a 61 y.o. female, DOB - 11/07/1955, [id number]  Admit date - 08/09/2016   Admitting Physician Vaughan Basta, MD  Outpatient Primary MD for the patient is Arnette Norris, MD   LOS - 0  Subjective: Patient having no further bleeding and endoscopy results noted    Review of Systems:   CONSTITUTIONAL: No documented fever. No fatigue, weakness. No weight gain, no weight loss.  EYES: No blurry or double vision.  ENT: No tinnitus. No postnasal drip. No redness of the oropharynx.  RESPIRATORY: No cough, no wheeze, no hemoptysis. No dyspnea.  CARDIOVASCULAR: No chest pain. No orthopnea. No palpitations. No syncope.  GASTROINTESTINAL: No nausea, no vomiting or diarrhea. No abdominal pain. No melena or hematochezia. Chronic GI symptoms GENITOURINARY: No dysuria or hematuria.  ENDOCRINE: No polyuria or nocturia. No heat or cold intolerance.  HEMATOLOGY: No anemia. No bruising. No bleeding.  INTEGUMENTARY: No rashes. No lesions.  MUSCULOSKELETAL: No arthritis. No swelling. No gout.  NEUROLOGIC: No numbness, tingling, or ataxia. No seizure-type activity.  PSYCHIATRIC: No anxiety. No insomnia. No ADD.    Vitals:   Vitals:   08/10/16 0903 08/10/16 0913 08/10/16 0923 08/10/16 0937  BP: 104/69 105/86 97/69 106/74  Pulse: 61 63 (!) 55 60  Resp:    18  Temp:    97.7 F (36.5 C)  TempSrc:    Oral  SpO2: 100% 100% 100% 100%  Weight:      Height:        Wt Readings from Last 3 Encounters:  08/09/16 137 lb 4.8 oz (62.3 kg)  08/13/15 140 lb 4 oz (63.6 kg)  08/06/15 139 lb (63 kg)     Intake/Output Summary (Last 24 hours) at 08/10/16 1141 Last data filed at 08/10/16 0845  Gross per 24 hour  Intake              1710 ml  Output                0 ml  Net             1710 ml    Physical Exam:   GENERAL: Pleasant-appearing in no apparent distress.  HEAD, EYES, EARS, NOSE AND THROAT: Atraumatic, normocephalic. Extraocular muscles are intact. Pupils equal and reactive to light. Sclerae anicteric. No conjunctival injection. No oro-pharyngeal erythema.  NECK: Supple. There is no jugular venous distention. No bruits,  no lymphadenopathy, no thyromegaly.  HEART: Regular rate and rhythm,. No murmurs, no rubs, no clicks.  LUNGS: Clear to auscultation bilaterally. No rales or rhonchi. No wheezes.  ABDOMEN: Soft, flat, nontender, nondistended. Has good bowel sounds. No hepatosplenomegaly appreciated.  EXTREMITIES: No evidence of any cyanosis, clubbing, or peripheral edema.  +2 pedal and radial pulses bilaterally.  NEUROLOGIC: The patient is alert, awake, and oriented x3 with no focal motor or sensory deficits appreciated bilaterally.  SKIN: Moist and warm with no rashes appreciated.  Psych: Not anxious, depressed LN: No inguinal LN enlargement    Antibiotics   Anti-infectives    None      Medications   Scheduled Meds: . acidophilus  1 capsule Oral QHS  . calcium-vitamin D  1 tablet Oral Daily  . hydrochlorothiazide  12.5 mg Oral Daily  . meloxicam  15 mg Oral Daily  . multivitamin with minerals  1 tablet Oral Daily  . pantoprazole (PROTONIX) IV  40 mg Intravenous Q12H  . polyethylene glycol-electrolytes  4,000 mL Oral Once  . pravastatin  10 mg Oral Daily  . propranolol  20 mg Oral BID   Continuous Infusions: . sodium chloride 75 mL/hr at 08/10/16 0101  . sodium chloride     PRN Meds:.butalbital-acetaminophen-caffeine, docusate sodium, traMADol   Data Review:   Micro Results Recent Results (from the past 240 hour(s))  C difficile quick scan w PCR reflex     Status: None   Collection Time: 08/09/16  9:53 AM  Result Value Ref Range Status   C Diff antigen NEGATIVE NEGATIVE Final   C  Diff toxin NEGATIVE NEGATIVE Final   C Diff interpretation No C. difficile detected.  Final  Gastrointestinal Panel by PCR , Stool     Status: None   Collection Time: 08/09/16  9:53 AM  Result Value Ref Range Status   Campylobacter species NOT DETECTED NOT DETECTED Final   Plesimonas shigelloides NOT DETECTED NOT DETECTED Final   Salmonella species NOT DETECTED NOT DETECTED Final   Yersinia enterocolitica NOT DETECTED NOT DETECTED Final   Vibrio species NOT DETECTED NOT DETECTED Final   Vibrio cholerae NOT DETECTED NOT DETECTED Final   Enteroaggregative E coli (EAEC) NOT DETECTED NOT DETECTED Final   Enteropathogenic E coli (EPEC) NOT DETECTED NOT DETECTED Final   Enterotoxigenic E coli (ETEC) NOT DETECTED NOT DETECTED Final   Shiga like toxin producing E coli (STEC) NOT DETECTED NOT DETECTED Final   Shigella/Enteroinvasive E coli (EIEC) NOT DETECTED NOT DETECTED Final   Cryptosporidium NOT DETECTED NOT DETECTED Final   Cyclospora cayetanensis NOT DETECTED NOT DETECTED Final   Entamoeba histolytica NOT DETECTED NOT DETECTED Final   Giardia lamblia NOT DETECTED NOT DETECTED Final   Adenovirus F40/41 NOT DETECTED NOT DETECTED Final   Astrovirus NOT DETECTED NOT DETECTED Final   Norovirus GI/GII NOT DETECTED NOT DETECTED Final   Rotavirus A NOT DETECTED NOT DETECTED Final   Sapovirus (I, II, IV, and V) NOT DETECTED NOT DETECTED Final    Radiology Reports No results found.   CBC  Recent Labs Lab 08/09/16 0825 08/10/16 0358  WBC 4.3 3.9  HGB 12.2 10.9*  HCT 34.4* 31.7*  PLT 183 149*  MCV 92.2 93.1  MCH 32.7 32.1  MCHC 35.4 34.4  RDW 12.7 12.5    Chemistries   Recent Labs Lab 08/09/16 0825 08/10/16 0358  NA 138 142  K 3.7 3.5  CL 102 108  CO2 28 26  GLUCOSE 96 96  BUN 18  11  CREATININE 1.03* 0.74  CALCIUM 9.5 8.9  AST 29  --   ALT 22  --   ALKPHOS 51  --   BILITOT 0.9  --     ------------------------------------------------------------------------------------------------------------------ estimated creatinine clearance is 63.8 mL/min (by C-G formula based on SCr of 0.74 mg/dL). ------------------------------------------------------------------------------------------------------------------ No results for input(s): HGBA1C in the last 72 hours. ------------------------------------------------------------------------------------------------------------------ No results for input(s): CHOL, HDL, LDLCALC, TRIG, CHOLHDL, LDLDIRECT in the last 72 hours. ------------------------------------------------------------------------------------------------------------------ No results for input(s): TSH, T4TOTAL, T3FREE, THYROIDAB in the last 72 hours.  Invalid input(s): FREET3 ------------------------------------------------------------------------------------------------------------------ No results for input(s): VITAMINB12, FOLATE, FERRITIN, TIBC, IRON, RETICCTPCT in the last 72 hours.  Coagulation profile No results for input(s): INR, PROTIME in the last 168 hours.  No results for input(s): DDIMER in the last 72 hours.  Cardiac Enzymes No results for input(s): CKMB, TROPONINI, MYOGLOBIN in the last 168 hours.  Invalid input(s): CK ------------------------------------------------------------------------------------------------------------------ Invalid input(s): POCBNP    Assessment & Plan   * GI bleed   EGD negative Plan for colonoscopy tomorrow Continue to hold aspirin  *Acute blood loss anemia Continue to monitor her hemoglobin has not significantly dropped no need for transfusion  * Irritable bowel syndrome   continue supportive care  * Hypertension   Continue hydrochlorothiazide and pantoprazole.  * Hyperlipidemia   Continue pravastatin.      Code Status Orders        Start     Ordered   08/09/16 1413  Full code  Continuous      08/09/16 1413    Code Status History    Date Active Date Inactive Code Status Order ID Comments User Context   This patient has a current code status but no historical code status.    Advance Directive Documentation   [place] Most Recent Value  Type of Advance Directive  Living will  Pre-existing out of facility DNR order (yellow form or pink MOST form)  No data  "MOST" Form in Place?  No data           Consults  35MIN  DVT Prophylaxis  SCDs Lab Results  Component Value Date   PLT 149 (L) 08/10/2016     Time Spent in minutes   35MIN  Greater than 50% of time spent in care coordination and counseling patient regarding the condition and plan of care.   Dustin Flock M.D on 08/10/2016 at 11:41 AM  Between 7am to 6pm - Pager - [phone]  After 6pm go to [email or web address] - password [email or web address] [facility]   Office  [phone]

## 2016-08-10 NOTE — Transfer of Care (Signed)
Immediate Anesthesia Transfer of Care Note  Patient: Krystal Walker  Procedure(s) Performed: Procedure(s): ESOPHAGOGASTRODUODENOSCOPY (EGD) WITH PROPOFOL (N/A)  Patient Location: Endoscopy Unit  Anesthesia Type:General  Level of Consciousness: sedated  Airway & Oxygen Therapy: Patient Spontanous Breathing and Patient connected to nasal cannula oxygen  Post-op Assessment: Report given to RN and Post -op Vital signs reviewed and stable  Post vital signs: Reviewed and stable  Last Vitals:  Vitals:   08/09/16 2037 08/10/16 0435  BP: (!) 123/91 106/69  Pulse: 73 61  Resp: 20 18  Temp: 36.4 C 36.4 C    Last Pain:  Vitals:   08/10/16 0435  TempSrc: Oral  PainSc:          Complications: No apparent anesthesia complications

## 2016-08-10 NOTE — Plan of Care (Signed)
Problem: Bowel/Gastric: Goal: Will show no signs and symptoms of gastrointestinal bleeding Outcome: Progressing No active bleeding this shift. NPO for EGD later today.

## 2016-08-10 NOTE — Anesthesia Preprocedure Evaluation (Signed)
Anesthesia Evaluation  Patient identified by MRN, date of birth, ID band Patient awake    Reviewed: Allergy & Precautions, NPO status , Patient's Chart, lab work & pertinent test results  History of Anesthesia Complications Negative for: history of anesthetic complications  Airway Mallampati: I  TM Distance: >3 FB Neck ROM: Full    Dental no notable dental hx.    Pulmonary neg sleep apnea, neg COPD, former smoker,    breath sounds clear to auscultation- rhonchi (-) wheezing      Cardiovascular Exercise Tolerance: Good hypertension, Pt. on medications (-) CAD and (-) Past MI  Rhythm:Regular Rate:Normal - Systolic murmurs and - Diastolic murmurs    Neuro/Psych  Headaches, PSYCHIATRIC DISORDERS Depression    GI/Hepatic Neg liver ROS, GERD  ,IBS, hx of salivary gland CA, melanotic stools   Endo/Other  negative endocrine ROSneg diabetes  Renal/GU negative Renal ROS     Musculoskeletal negative musculoskeletal ROS (+)   Abdominal (+) - obese,   Peds  Hematology negative hematology ROS (+)   Anesthesia Other Findings Past Medical History: 1997: Bowel trouble No date: Breast screening, unspecified No date: Depression No date: GERD (gastroesophageal reflux disease) No date: Hyperlipidemia No date: Hypertension No date: IBS (irritable bowel syndrome) No date: Mammographic microcalcification No date: Osteoporosis No date: Personal history of tobacco use, presenting ha* No date: Radiation     Comment: for adenoid cystic carcinoma No date: Raynaud disease 1982: Raynaud phenomenon 1999: Salivary gland cancer ([place])     Comment: s/p resection and radiation No date: Special screening for malignant neoplasms, col*   Reproductive/Obstetrics                             Anesthesia Physical Anesthesia Plan  ASA: III  Anesthesia Plan: General   Post-op Pain Management:    Induction:  Intravenous  Airway Management Planned: Natural Airway  Additional Equipment:   Intra-op Plan:   Post-operative Plan:   Informed Consent: I have reviewed the patients History and Physical, chart, labs and discussed the procedure including the risks, benefits and alternatives for the proposed anesthesia with the patient or authorized representative who has indicated his/her understanding and acceptance.   Dental advisory given  Plan Discussed with: CRNA and Anesthesiologist  Anesthesia Plan Comments:         Lab Results  Component Value Date   WBC 3.9 08/10/2016   HGB 10.9 (L) 08/10/2016   HCT 31.7 (L) 08/10/2016   MCV 93.1 08/10/2016   PLT 149 (L) 08/10/2016    Anesthesia Quick Evaluation

## 2016-08-10 NOTE — H&P (View-Only) (Signed)
Referring Provider: Dr. Posey Pronto Primary Care Physician:  Arnette Norris, MD Primary Gastroenterologist:  Althia Forts  Reason for Consultation:  Melena  HPI: Krystal Walker is a 61 y.o. female with acute onset of black stools several times yesterday without associated abdominal pain, nausea, vomiting, hematemesis, or dizziness. Denies red blood per rectum. Has mixed-type IBS and reports that she has been more constipated lately. Reports a normal colonoscopy 9 years ago. Has taken Meloxicam for 2 years and is on a baby aspirin. Hgb 12.2 on admit yesterday and now 10.9. Hemodynamically stable. Denies any previous history of black stools.  Past Medical History:  Diagnosis Date  . Bowel trouble 1997  . Breast screening, unspecified   . Depression   . GERD (gastroesophageal reflux disease)   . Hyperlipidemia   . Hypertension   . IBS (irritable bowel syndrome)   . Mammographic microcalcification   . Osteoporosis   . Personal history of tobacco use, presenting hazards to health   . Radiation    for adenoid cystic carcinoma  . Raynaud disease   . Raynaud phenomenon 1982  . 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 Special screening for malignant neoplasms, colon     Past Surgical History:  Procedure Laterality Date  . ABDOMINAL HYSTERECTOMY  2004   partial  . BREAST BIOPSY Left 02-23-12   w/clip - benign  . COLONOSCOPY  2010  . PAROTIDECTOMY Right    adenoid cystic carcinoma  . SALIVARY GLAND SURGERY  1999    Prior to Admission medications   Medication Sig Start Date End Date Taking? Authorizing Provider  aspirin EC 81 MG tablet Take 81 mg by mouth daily.   Yes Historical Provider, MD  butalbital-acetaminophen-caffeine (FIORICET) 50-325-40 MG tablet Take 1-2 tablets by mouth every 6 (six) hours as needed for headache. 02/14/16 02/13/17 Yes Lucille Passy, MD  Calcium Carbonate-Vitamin D (CALCIUM 600+D) 600-400 MG-UNIT tablet Take 1 tablet by mouth daily.   Yes  Historical Provider, MD  hydrochlorothiazide (HYDRODIURIL) 12.5 MG tablet Take 12.5 mg by mouth daily. 08/05/16 11/03/16 Yes Historical Provider, MD  meloxicam (MOBIC) 15 MG tablet TAKE 1 TABLET BY MOUTH  DAILY 03/12/16  Yes Lucille Passy, MD  Multiple Vitamin (MULTIVITAMIN WITH MINERALS) TABS tablet Take 1 tablet by mouth daily.   Yes Historical Provider, MD  omeprazole (PRILOSEC) 40 MG capsule TAKE 1 CAPSULE BY MOUTH  EVERY DAY 03/12/16  Yes Lucille Passy, MD  pravastatin (PRAVACHOL) 10 MG tablet TAKE 1 TABLET BY MOUTH  DAILY 03/24/16  Yes Lucille Passy, MD  Probiotic Product (ALIGN) 4 MG CAPS Take 4 mg by mouth daily.   Yes Historical Provider, MD  propranolol (INDERAL) 20 MG tablet Take 20 mg by mouth 2 (two) times daily. 05/14/16  Yes Historical Provider, MD  traMADol (ULTRAM) 50 MG tablet Take 1 tablet (50 mg total) by mouth every 6 (six) hours as needed for moderate pain. 07/03/16  Yes Lucille Passy, MD    Scheduled Meds: . [MAR Hold] acidophilus  1 capsule Oral QHS  . [MAR Hold] calcium-vitamin D  1 tablet Oral Daily  . [MAR Hold] hydrochlorothiazide  12.5 mg Oral Daily  . [MAR Hold] meloxicam  15 mg Oral Daily  . [MAR Hold] multivitamin with minerals  1 tablet Oral Daily  . [MAR Hold] pantoprazole (PROTONIX) IV  40 mg Intravenous Q12H  . [MAR Hold] pravastatin  10 mg Oral Daily  . [MAR Hold] propranolol  20  mg Oral BID   Continuous Infusions: . sodium chloride 75 mL/hr at 08/10/16 0101   PRN Meds:.[MAR Hold] butalbital-acetaminophen-caffeine, [MAR Hold] docusate sodium, [MAR Hold] traMADol  Allergies as of 08/09/2016 - Review Complete 08/09/2016  Allergen Reaction Noted  . Latex Hives and Itching 12/08/2011  . Morphine and related Nausea And Vomiting 11/06/2011  . Nickel  08/09/2016    Family History  Problem Relation Age of Onset  . Arrhythmia Mother     pacemaker  . Fainting Mother   . Hypertension Mother   . Hyperlipidemia Mother   . Breast cancer Neg Hx     Social  History   Social History  . Marital status: Single    Spouse name: N/A  . Number of children: N/A  . Years of education: N/A   Occupational History  . Not on file.   Social History Main Topics  . Smoking status: Former Smoker    Packs/day: 1.00    Years: 15.00    Types: Cigarettes  . Smokeless tobacco: Never Used     Comment: quit 1986  . Alcohol use No  . Drug use: No  . Sexual activity: Not on file   Other Topics Concern  . Not on file   Social History Narrative  . No narrative on file    Review of Systems: All negative except as stated above in HPI.  Physical Exam: Vital signs: Vitals:   08/09/16 2037 08/10/16 0435  BP: (!) 123/91 106/69  Pulse: 73 61  Resp: 20 18  Temp: 97.6 F (36.4 C) 97.5 F (36.4 C)   Last BM Date: 08/09/16 General:   Alert,  Well-developed, well-nourished, pleasant and cooperative in NAD, elderly HEENT: anicteric sclera, oropharynx clear Lungs:  Clear throughout to auscultation.   No wheezes, crackles, or rhonchi. No acute distress. Heart:  Regular rate and rhythm; no murmurs, clicks, rubs,  or gallops. Abdomen: soft, nontender, nondistended, +BS  Rectal:  Deferred Ext: no edema  GI:  Lab Results:  Recent Labs  08/09/16 0825 08/10/16 0358  WBC 4.3 3.9  HGB 12.2 10.9*  HCT 34.4* 31.7*  PLT 183 149*   BMET  Recent Labs  08/09/16 0825 08/10/16 0358  NA 138 142  K 3.7 3.5  CL 102 108  CO2 28 26  GLUCOSE 96 96  BUN 18 11  CREATININE 1.03* 0.74  CALCIUM 9.5 8.9   LFT  Recent Labs  08/09/16 0825  PROT 6.7  ALBUMIN 4.3  AST 29  ALT 22  ALKPHOS 51  BILITOT 0.9   PT/INR No results for input(s): LABPROT, INR in the last 72 hours.   Studies/Results: No results found.  Impression/Plan: Melena in the setting of chronic NSAID use concerning for a peptic ulcer bleed. EGD this morning. Continue Protonix IV. NPO for EGD. Supportive care.    LOS: 0 days   [place] C.  08/10/2016, 8:34 AM

## 2016-08-10 NOTE — Progress Notes (Signed)
Pt complete jug of golytely

## 2016-08-10 NOTE — Brief Op Note (Signed)
Normal EGD. No source of bleeding seen. Will prep this afternoon for a colonoscopy which will be done by Dr. Vicente Males on 08/11/16. Clear liquid diet. NPO p MN.

## 2016-08-11 ENCOUNTER — Observation Stay: Payer: 59 | Admitting: Certified Registered Nurse Anesthetist

## 2016-08-11 ENCOUNTER — Encounter: Payer: Self-pay | Admitting: Certified Registered Nurse Anesthetist

## 2016-08-11 ENCOUNTER — Encounter
Admission: EM | Disposition: A | Discharge status: Home / Self Care | Payer: Self-pay | Source: Home / Self Care | Attending: Emergency Medicine

## 2016-08-11 DIAGNOSIS — K5730 Diverticulosis of large intestine without perforation or abscess without bleeding: Secondary | ICD-10-CM | POA: Diagnosis not present

## 2016-08-11 DIAGNOSIS — K922 Gastrointestinal hemorrhage, unspecified: Secondary | ICD-10-CM | POA: Diagnosis not present

## 2016-08-11 DIAGNOSIS — K921 Melena: Secondary | ICD-10-CM | POA: Diagnosis not present

## 2016-08-11 HISTORY — PX: COLONOSCOPY WITH PROPOFOL: SHX5780

## 2016-08-11 LAB — CBC
HCT: 30.6 % — ABNORMAL LOW (ref 35.0–47.0)
Hemoglobin: 10.9 g/dL — ABNORMAL LOW (ref 12.0–16.0)
MCH: 33 pg (ref 26.0–34.0)
MCHC: 35.7 g/dL (ref 32.0–36.0)
MCV: 92.5 fL (ref 80.0–100.0)
Platelets: 140 10*3/uL — ABNORMAL LOW (ref 150–440)
RBC: 3.31 MIL/uL — ABNORMAL LOW (ref 3.80–5.20)
RDW: 12.5 % (ref 11.5–14.5)
WBC: 4 10*3/uL (ref 3.6–11.0)

## 2016-08-11 SURGERY — COLONOSCOPY WITH PROPOFOL
Anesthesia: General

## 2016-08-11 MED ORDER — PROPOFOL 500 MG/50ML IV EMUL
INTRAVENOUS | Status: DC | PRN
  Administered 2016-08-11: 140 ug/kg/min via INTRAVENOUS

## 2016-08-11 MED ORDER — SODIUM CHLORIDE 0.9 % IJ SOLN
INTRAMUSCULAR | Status: AC
  Filled 2016-08-11: qty 10

## 2016-08-11 MED ORDER — MIDAZOLAM HCL 2 MG/2ML IJ SOLN
INTRAMUSCULAR | Status: AC
  Filled 2016-08-11: qty 2

## 2016-08-11 MED ORDER — MIDAZOLAM HCL 2 MG/2ML IJ SOLN
INTRAMUSCULAR | Status: DC | PRN
  Administered 2016-08-11 (×2): 1 mg via INTRAVENOUS

## 2016-08-11 MED ORDER — PROPOFOL 10 MG/ML IV BOLUS
INTRAVENOUS | Status: DC | PRN
  Administered 2016-08-11: 10 mg via INTRAVENOUS
  Administered 2016-08-11: 30 mg via INTRAVENOUS
  Administered 2016-08-11: 10 mg via INTRAVENOUS

## 2016-08-11 MED ORDER — PANTOPRAZOLE SODIUM 40 MG PO TBEC
40.0000 mg | DELAYED_RELEASE_TABLET | Freq: Every day | ORAL | Status: DC
  Administered 2016-08-11: 40 mg via ORAL
  Filled 2016-08-11: qty 1

## 2016-08-11 MED ORDER — EPHEDRINE SULFATE 50 MG/ML IJ SOLN
INTRAMUSCULAR | Status: AC
  Filled 2016-08-11: qty 1

## 2016-08-11 MED ORDER — PHENYLEPHRINE HCL 10 MG/ML IJ SOLN
INTRAMUSCULAR | Status: AC
  Filled 2016-08-11: qty 1

## 2016-08-11 MED ORDER — LIDOCAINE HCL (PF) 2 % IJ SOLN
INTRAMUSCULAR | Status: AC
  Filled 2016-08-11: qty 2

## 2016-08-11 MED ORDER — PROPOFOL 500 MG/50ML IV EMUL
INTRAVENOUS | Status: AC
  Filled 2016-08-11: qty 50

## 2016-08-11 MED ORDER — SODIUM CHLORIDE 0.9 % IV SOLN
INTRAVENOUS | Status: DC
  Administered 2016-08-11: 08:00:00 via INTRAVENOUS

## 2016-08-11 MED ORDER — LIDOCAINE HCL (CARDIAC) 20 MG/ML IV SOLN
INTRAVENOUS | Status: DC | PRN
  Administered 2016-08-11: 50 mg via INTRAVENOUS

## 2016-08-11 NOTE — Op Note (Signed)
[facility] Gastroenterology Patient Name: Krystal Walker Procedure Date: 08/11/2016 8:04 AM MRN: [id number] Account #: [id number] Date of Birth: [birth year] Admit Type: Inpatient Age: 61 Room: [facility] ENDO ROOM 4 Gender: Female Note Status: Finalized Procedure:            Colonoscopy Indications:          Melena Providers:            Jonathon Bellows MD, MD Referring MD:         Marciano Sequin. Deborra Medina (Referring MD) Medicines:            Monitored Anesthesia Care Complications:        No immediate complications. Procedure:            Pre-Anesthesia Assessment:                       - Prior to the procedure, a History and Physical was                        performed, and patient medications, allergies and                        sensitivities were reviewed. The patient's tolerance of                        previous anesthesia was reviewed.                       - The risks and benefits of the procedure and the                        sedation options and risks were discussed with the                        patient. All questions were answered and informed                        consent was obtained.                       - ASA Grade Assessment: III - A patient with severe                        systemic disease.                       After obtaining informed consent, the colonoscope was                        passed under direct vision. Throughout the procedure,                        the patient's blood pressure, pulse, and oxygen                        saturations were monitored continuously. The                        Colonoscope was introduced through the anus and                        advanced to the  the cecum, identified by the                        appendiceal orifice, IC valve and transillumination.                        The colonoscopy was somewhat difficult due to a                        tortuous colon. The patient tolerated the procedure                        well.  The quality of the bowel preparation was poor. Findings:      The perianal and digital rectal examinations were normal.      A few small-mouthed diverticula were found in the entire colon.      Non-bleeding internal hemorrhoids were found during retroflexion. The       hemorrhoids were large and Grade I (internal hemorrhoids that do not       prolapse).      The exam was otherwise without abnormality on direct and retroflexion       views. Impression:           - Preparation of the colon was poor.                       - Diverticulosis in the entire examined colon.                       - Non-bleeding internal hemorrhoids.                       - The examination was otherwise normal on direct and                        retroflexion views.                       - No specimens collected. Recommendation:       - Return patient to hospital ward for ongoing care.                       - Advance diet as tolerated.                       - Continue present medications.                       - Colonoscopy prep was poor. Inadequate for polyp                        detection . Adequate to rule out an active bleed                        presently. No blood seebn in the colon . Procedure Code(s):    --- Professional ---                       [phone], Colonoscopy, flexible; diagnostic, including                        collection of specimen(s) by brushing or washing, when  performed (separate procedure) Diagnosis Code(s):    --- Professional ---                       K64.0, First degree hemorrhoids                       K92.1, Melena (includes Hematochezia)                       K57.30, Diverticulosis of large intestine without                        perforation or abscess without bleeding CPT copyright 2016 American Medical Association. All rights reserved. The codes documented in this report are preliminary and upon coder review may  be revised to meet current compliance  requirements. Jonathon Bellows, MD Jonathon Bellows MD, MD 08/11/2016 8:35:12 AM This report has been signed electronically. Number of Addenda: 0 Note Initiated On: 08/11/2016 8:04 AM Scope Withdrawal Time: 0 hours 7 minutes 46 seconds  Total Procedure Duration: 0 hours 19 minutes 12 seconds       [facility]

## 2016-08-11 NOTE — Discharge Instructions (Signed)
Avoids NSAIDS

## 2016-08-11 NOTE — Progress Notes (Signed)
Colonoscopy   1. Poor prep but no active bleeding seen , no large polyps seen 2. Few diverticuli seen throughout the colon which could have been the source of bleed.  3. Large internal hemorroids  Plan   1. Advance diet  2. Discharged when tolerating PO and Hb stable 3. If colonoscopy needed for colorectal cancer screening in the future- suggest 2 day prep. This procedure was inadequate to screen for polyps.  4. Avoid NSAID's  I will sign off.  Please call me if any further GI concerns or questions.  We would like to thank you for the opportunity to participate in the care of Krystal Walker.   Dr Jonathon Bellows  Gastroenterology/Hepatology Pager: [phone]

## 2016-08-11 NOTE — Progress Notes (Signed)
Krystal Walker at [place] NAME: Krystal Walker    MR#:  [id number]  DATE OF BIRTH:  [birth year]  SUBJECTIVE:  Denies any complaints. No melena or bloody stools Awaiting colonscopy  REVIEW OF SYSTEMS:   Review of Systems  Constitutional: Negative for chills, fever and weight loss.  HENT: Negative for ear discharge, ear pain and nosebleeds.   Eyes: Negative for blurred vision, pain and discharge.  Respiratory: Negative for sputum production, shortness of breath, wheezing and stridor.   Cardiovascular: Negative for chest pain, palpitations, orthopnea and PND.  Gastrointestinal: Negative for abdominal pain, diarrhea, nausea and vomiting.  Genitourinary: Negative for frequency and urgency.  Musculoskeletal: Negative for back pain and joint pain.  Neurological: Negative for sensory change, speech change, focal weakness and weakness.  Psychiatric/Behavioral: Negative for depression and hallucinations. The patient is not nervous/anxious.    Tolerating Diet:npo Tolerating PT: not needed  DRUG ALLERGIES:   Allergies  Allergen Reactions  . Latex Hives and Itching  . Morphine And Related Nausea And Vomiting  . Nickel     VITALS:  Blood pressure (!) 141/72, pulse 70, temperature 98.2 F (36.8 C), temperature source Oral, resp. rate 18, height 5\' 4"  (1.626 m), weight 62.3 kg (137 lb 4.8 oz), SpO2 100 %.  PHYSICAL EXAMINATION:   Physical Exam  GENERAL:  61 y.o.-year-old patient lying in the bed with no acute distress.  EYES: Pupils equal, round, reactive to light and accommodation. No scleral icterus. Extraocular muscles intact.  HEENT: Head atraumatic, normocephalic. Oropharynx and nasopharynx clear.  NECK:  Supple, no jugular venous distention. No thyroid enlargement, no tenderness.  LUNGS: Normal breath sounds bilaterally, no wheezing, rales, rhonchi. No use of accessory muscles of respiration.  CARDIOVASCULAR: S1, S2 normal. No  murmurs, rubs, or gallops.  ABDOMEN: Soft, nontender, nondistended. Bowel sounds present. No organomegaly or mass.  EXTREMITIES: No cyanosis, clubbing or edema b/l.    NEUROLOGIC: Cranial nerves II through XII are intact. No focal Motor or sensory deficits b/l.   PSYCHIATRIC:  patient is alert and oriented x 3.  SKIN: No obvious rash, lesion, or ulcer.   LABORATORY PANEL:  CBC  Recent Labs Lab 08/11/16 0408  WBC 4.0  HGB 10.9*  HCT 30.6*  PLT 140*    Chemistries   Recent Labs Lab 08/09/16 0825 08/10/16 0358  NA 138 142  K 3.7 3.5  CL 102 108  CO2 28 26  GLUCOSE 96 96  BUN 18 11  CREATININE 1.03* 0.74  CALCIUM 9.5 8.9  AST 29  --   ALT 22  --   ALKPHOS 51  --   BILITOT 0.9  --    Cardiac Enzymes No results for input(s): TROPONINI in the last 168 hours. RADIOLOGY:  No results found. ASSESSMENT AND PLAN:  Krystal Walker  is a 61 y.o. female with a known history of Irritable bowel syndrome, depression, hyperlipidemia, hypertension, Raynaud's disease, saliva gland cancer- follows with GI clinic.  she had 3 episodes of loss of control on her bowels and having loose stool.  * Melena with possible slow GI bleed EGD negative Plan for colonoscopy today Continue to hold aspirin -pt advised to stay off NSAIDS  *Acute blood loss anemia Continue to monitor her hemoglobin has not significantly dropped no need for transfusion -hgb stable at 10.9  * Irritable bowel syndrome  continue supportive care  * Hypertension Continue hydrochlorothiazide and pantoprazole.  * Hyperlipidemia Continue pravastatin.  D/c plans pending colonosocpy.  Case discussed with Care Management/Social Worker. Management plans discussed with the patient, family and they are in agreement.  CODE STATUS: FULL  DVT Prophylaxis: SCD TOTAL TIME TAKING CARE OF THIS PATIENT: 25 minutes.  >50% time spent on counselling and coordination of care  POSSIBLE D/C TODAY DEPENDING ON CLINICAL  CONDITION.  Note: This dictation was prepared with Dragon dictation along with smaller phrase technology. Any transcriptional errors that result from this process are unintentional.  Itzabella Sorrels M.D on 08/11/2016 at 7:40 AM  Between 7am to 6pm - Pager - [phone]  After 6pm go to [email or web address] - password [email or web address] [facility]  Office  [phone]  CC: Primary care physician; Arnette Norris, MD

## 2016-08-11 NOTE — Plan of Care (Signed)
MD making rounds. Order received to discharge home. IV removed. Provided with education handouts. Discharge paperwork provided, explained, signed and witnessed. No questions left unanswered. Discharged via wheelchair by auxiliary staff. Belongings sent with patient and family.

## 2016-08-11 NOTE — Anesthesia Procedure Notes (Signed)
Date/Time: 08/11/2016 8:12 AM Performed by: Johnna Acosta Pre-anesthesia Checklist: Patient identified, Emergency Drugs available, Suction available, Patient being monitored and Timeout performed Patient Re-evaluated:Patient Re-evaluated prior to inductionOxygen Delivery Method: Nasal cannula

## 2016-08-11 NOTE — Transfer of Care (Signed)
Immediate Anesthesia Transfer of Care Note  Patient: Krystal Walker  Procedure(s) Performed: Procedure(s): COLONOSCOPY WITH PROPOFOL (N/A)  Patient Location: PACU  Anesthesia Type:General  Level of Consciousness: awake and alert   Airway & Oxygen Therapy: Patient Spontanous Breathing and Patient connected to nasal cannula oxygen  Post-op Assessment: Report given to RN and Post -op Vital signs reviewed and stable  Post vital signs: Reviewed and stable  Last Vitals:  Vitals:   08/11/16 0735 08/11/16 0758  BP: (!) 141/72 130/84  Pulse: 70 65  Resp: 18 18  Temp: 36.8 C 36.4 C    Last Pain:  Vitals:   08/11/16 0758  TempSrc: Tympanic  PainSc:          Complications: No apparent anesthesia complications

## 2016-08-11 NOTE — Progress Notes (Signed)
[place] was admitted to the Hospital on 08/09/2016 and Discharged  08/11/2016 and should be excused from work/school   for 3  days starting 08/09/2016 , may return to work/school without any restrictions. Return to work 08/13/16  Call Fritzi Mandes MD, [facility]  [phone] with questions.  Anniece Bleiler M.D on 08/11/2016,at 10:17 AM

## 2016-08-11 NOTE — Discharge Summary (Signed)
[facility] [place] NAME: Krystal Walker    MR#:  [id number]  DATE OF BIRTH:  [birth year]  DATE OF ADMISSION:  08/09/2016 ADMITTING PHYSICIAN: Vaughan Basta, MD  DATE OF DISCHARGE: 08/11/2016  PRIMARY CARE PHYSICIAN: Arnette Norris, MD    ADMISSION DIAGNOSIS:  Acute GI bleeding [K92.2]  DISCHARGE DIAGNOSIS:  Gl bleeding suspected due to internal hemorrhoids  SECONDARY DIAGNOSIS:   Past Medical History:  Diagnosis Date  . Bowel trouble 1997  . Breast screening, unspecified   . Depression   . GERD (gastroesophageal reflux disease)   . GI bleed   . Hyperlipidemia   . Hypertension   . IBS (irritable bowel syndrome)   . Mammographic microcalcification   . Osteoporosis   . Personal history of tobacco use, presenting hazards to health   . Radiation    for adenoid cystic carcinoma  . Raynaud disease   . Raynaud phenomenon 1982  . 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 Special screening for malignant neoplasms, colon     HOSPITAL COURSE:  MaryShepherdis a 61 y.o.femalewith a known history of Irritable bowel syndrome, depression, hyperlipidemia, hypertension, Raynaud's disease, saliva gland cancer- follows with GI clinic. she had 3 episodes of loss of control on her bowels and having loose stool.  * Melena with possible slow GI bleed EGD negative Colonoscopy showed large INternal hemmorhoids. Some diverticuli -pt advised to stay off NSAIDS  D/w GI ok to go home  *mild anemia Continue to monitor her hemoglobin has not significantly dropped no need for transfusion -hgb stable at 10.9  * Irritable bowel syndrome continue supportive care  * Hypertension Continue hydrochlorothiazide and pantoprazole.  * Hyperlipidemia Continue pravastatin.  Overall stable D/c home CONSULTS OBTAINED:    DRUG ALLERGIES:   Allergies  Allergen Reactions  . Latex Hives and Itching  .  Morphine And Related Nausea And Vomiting  . Nickel     DISCHARGE MEDICATIONS:   Current Discharge Medication List    CONTINUE these medications which have NOT CHANGED   Details  aspirin EC 81 MG tablet Take 81 mg by mouth daily.    butalbital-acetaminophen-caffeine (FIORICET) 50-325-40 MG tablet Take 1-2 tablets by mouth every 6 (six) hours as needed for headache. Qty: 60 tablet, Refills: 0    Calcium Carbonate-Vitamin D (CALCIUM 600+D) 600-400 MG-UNIT tablet Take 1 tablet by mouth daily.    hydrochlorothiazide (HYDRODIURIL) 12.5 MG tablet Take 12.5 mg by mouth daily.    Multiple Vitamin (MULTIVITAMIN WITH MINERALS) TABS tablet Take 1 tablet by mouth daily.    omeprazole (PRILOSEC) 40 MG capsule TAKE 1 CAPSULE BY MOUTH  EVERY DAY Qty: 90 capsule, Refills: 1    pravastatin (PRAVACHOL) 10 MG tablet TAKE 1 TABLET BY MOUTH  DAILY Qty: 90 tablet, Refills: 3    Probiotic Product (ALIGN) 4 MG CAPS Take 4 mg by mouth daily.    propranolol (INDERAL) 20 MG tablet Take 20 mg by mouth 2 (two) times daily.    traMADol (ULTRAM) 50 MG tablet Take 1 tablet (50 mg total) by mouth every 6 (six) hours as needed for moderate pain. Qty: 30 tablet, Refills: 0      STOP taking these medications     meloxicam (MOBIC) 15 MG tablet         If you experience worsening of your admission symptoms, develop shortness of breath, life threatening emergency, suicidal or homicidal thoughts you must seek medical attention  immediately by calling 911 or calling your MD immediately  if symptoms less severe.  You Must read complete instructions/literature along with all the possible adverse reactions/side effects for all the Medicines you take and that have been prescribed to you. Take any new Medicines after you have completely understood and accept all the possible adverse reactions/side effects.   Please note  You were cared for by a hospitalist during your hospital stay. If you have any questions about  your discharge medications or the care you received while you were in the hospital after you are discharged, you can call the unit and asked to speak with the hospitalist on call if the hospitalist that took care of you is not available. Once you are discharged, your primary care physician will handle any further medical issues. Please note that NO REFILLS for any discharge medications will be authorized once you are discharged, as it is imperative that you return to your primary care physician (or establish a relationship with a primary care physician if you do not have one) for your aftercare needs so that they can reassess your need for medications and monitor your lab values.   DATA REVIEW:   CBC   Recent Labs Lab 08/11/16 0408  WBC 4.0  HGB 10.9*  HCT 30.6*  PLT 140*    Chemistries   Recent Labs Lab 08/09/16 0825 08/10/16 0358  NA 138 142  K 3.7 3.5  CL 102 108  CO2 28 26  GLUCOSE 96 96  BUN 18 11  CREATININE 1.03* 0.74  CALCIUM 9.5 8.9  AST 29  --   ALT 22  --   ALKPHOS 51  --   BILITOT 0.9  --     Microbiology Results   Recent Results (from the past 240 hour(s))  C difficile quick scan w PCR reflex     Status: None   Collection Time: 08/09/16  9:53 AM  Result Value Ref Range Status   C Diff antigen NEGATIVE NEGATIVE Final   C Diff toxin NEGATIVE NEGATIVE Final   C Diff interpretation No C. difficile detected.  Final  Gastrointestinal Panel by PCR , Stool     Status: None   Collection Time: 08/09/16  9:53 AM  Result Value Ref Range Status   Campylobacter species NOT DETECTED NOT DETECTED Final   Plesimonas shigelloides NOT DETECTED NOT DETECTED Final   Salmonella species NOT DETECTED NOT DETECTED Final   Yersinia enterocolitica NOT DETECTED NOT DETECTED Final   Vibrio species NOT DETECTED NOT DETECTED Final   Vibrio cholerae NOT DETECTED NOT DETECTED Final   Enteroaggregative E coli (EAEC) NOT DETECTED NOT DETECTED Final   Enteropathogenic E coli (EPEC)  NOT DETECTED NOT DETECTED Final   Enterotoxigenic E coli (ETEC) NOT DETECTED NOT DETECTED Final   Shiga like toxin producing E coli (STEC) NOT DETECTED NOT DETECTED Final   Shigella/Enteroinvasive E coli (EIEC) NOT DETECTED NOT DETECTED Final   Cryptosporidium NOT DETECTED NOT DETECTED Final   Cyclospora cayetanensis NOT DETECTED NOT DETECTED Final   Entamoeba histolytica NOT DETECTED NOT DETECTED Final   Giardia lamblia NOT DETECTED NOT DETECTED Final   Adenovirus F40/41 NOT DETECTED NOT DETECTED Final   Astrovirus NOT DETECTED NOT DETECTED Final   Norovirus GI/GII NOT DETECTED NOT DETECTED Final   Rotavirus A NOT DETECTED NOT DETECTED Final   Sapovirus (I, II, IV, and V) NOT DETECTED NOT DETECTED Final    RADIOLOGY:  No results found.   Management plans discussed with  the patient, family and they are in agreement.  CODE STATUS:     Code Status Orders        Start     Ordered   08/09/16 1413  Full code  Continuous     08/09/16 1413    Code Status History    Date Active Date Inactive Code Status Order ID Comments User Context   This patient has a current code status but no historical code status.    Advance Directive Documentation   [place] Most Recent Value  Type of Advance Directive  Living will  Pre-existing out of facility DNR order (yellow form or pink MOST form)  No data  "MOST" Form in Place?  No data      TOTAL TIME TAKING CARE OF THIS PATIENT: 40  minutes.    Daiden Coltrane M.D on 08/11/2016 at 10:14 AM  Between 7am to 6pm - Pager - [phone] After 6pm go to [email or web address] - password [email or web address] [facility]  Office  [phone]  CC: Primary care physician; Arnette Norris, MD

## 2016-08-11 NOTE — Anesthesia Post-op Follow-up Note (Cosign Needed)
Anesthesia QCDR form completed.        

## 2016-08-11 NOTE — Anesthesia Preprocedure Evaluation (Signed)
Anesthesia Evaluation  Patient identified by MRN, date of birth, ID band Patient awake    Reviewed: Allergy & Precautions, H&P , NPO status , Patient's Chart, lab work & pertinent test results, reviewed documented beta blocker date and time   Airway Mallampati: II   Neck ROM: full    Dental  (+) Teeth Intact   Pulmonary neg pulmonary ROS, former smoker,    Pulmonary exam normal        Cardiovascular hypertension, + Peripheral Vascular Disease  negative cardio ROS Normal cardiovascular exam Rhythm:regular Rate:Normal     Neuro/Psych  Headaches, PSYCHIATRIC DISORDERS negative neurological ROS  negative psych ROS   GI/Hepatic negative GI ROS, Neg liver ROS, GERD  Medicated,  Endo/Other  negative endocrine ROS  Renal/GU negative Renal ROS  negative genitourinary   Musculoskeletal   Abdominal   Peds  Hematology negative hematology ROS (+)   Anesthesia Other Findings Past Medical History: 1997: Bowel trouble No date: Breast screening, unspecified No date: Depression No date: GERD (gastroesophageal reflux disease) No date: Hyperlipidemia No date: Hypertension No date: IBS (irritable bowel syndrome) No date: Mammographic microcalcification No date: Osteoporosis No date: Personal history of tobacco use, presenting ha* No date: Radiation     Comment: for adenoid cystic carcinoma No date: Raynaud disease 1982: Raynaud phenomenon 1999: Salivary gland cancer ([place])     Comment: s/p resection and radiation No date: Special screening for malignant neoplasms, col* Past Surgical History: 2004: ABDOMINAL HYSTERECTOMY     Comment: partial 02-23-12: BREAST BIOPSY Left     Comment: w/clip - benign 2010: COLONOSCOPY No date: PAROTIDECTOMY Right     Comment: adenoid cystic carcinoma 1999: SALIVARY GLAND SURGERY BMI    Body Mass Index:  23.57 kg/m     Reproductive/Obstetrics negative OB ROS                              Anesthesia Physical Anesthesia Plan  ASA: III and emergent  Anesthesia Plan: General   Post-op Pain Management:    Induction:   Airway Management Planned:   Additional Equipment:   Intra-op Plan:   Post-operative Plan:   Informed Consent: I have reviewed the patients History and Physical, chart, labs and discussed the procedure including the risks, benefits and alternatives for the proposed anesthesia with the patient or authorized representative who has indicated his/her understanding and acceptance.   Dental Advisory Given  Plan Discussed with: CRNA  Anesthesia Plan Comments:         Anesthesia Quick Evaluation

## 2016-08-11 NOTE — H&P (Signed)
Jonathon Bellows MD [address] [place], [place] [address] Phone: [phone] Fax : [phone]  Primary Care Physician:  Arnette Norris, MD Primary Gastroenterologist:  Dr. Jonathon Bellows   Pre-Procedure History & Physical: HPI:  Krystal Walker is a 61 y.o. female is here for an colonoscopy.   Past Medical History:  Diagnosis Date  . Bowel trouble 1997  . Breast screening, unspecified   . Depression   . GERD (gastroesophageal reflux disease)   . GI bleed   . Hyperlipidemia   . Hypertension   . IBS (irritable bowel syndrome)   . Mammographic microcalcification   . Osteoporosis   . Personal history of tobacco use, presenting hazards to health   . Radiation    for adenoid cystic carcinoma  . Raynaud disease   . Raynaud phenomenon 1982  . 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 Special screening for malignant neoplasms, colon     Past Surgical History:  Procedure Laterality Date  . ABDOMINAL HYSTERECTOMY  2004   partial  . BREAST BIOPSY Left 02-23-12   w/clip - benign  . COLONOSCOPY  2010  . ESOPHAGOGASTRODUODENOSCOPY    . PAROTIDECTOMY Right    adenoid cystic carcinoma  . SALIVARY GLAND SURGERY  1999    Prior to Admission medications   Medication Sig Start Date End Date Taking? Authorizing Provider  aspirin EC 81 MG tablet Take 81 mg by mouth daily.   Yes Historical Provider, MD  butalbital-acetaminophen-caffeine (FIORICET) 50-325-40 MG tablet Take 1-2 tablets by mouth every 6 (six) hours as needed for headache. 02/14/16 02/13/17 Yes Lucille Passy, MD  Calcium Carbonate-Vitamin D (CALCIUM 600+D) 600-400 MG-UNIT tablet Take 1 tablet by mouth daily.   Yes Historical Provider, MD  hydrochlorothiazide (HYDRODIURIL) 12.5 MG tablet Take 12.5 mg by mouth daily. 08/05/16 11/03/16 Yes Historical Provider, MD  meloxicam (MOBIC) 15 MG tablet TAKE 1 TABLET BY MOUTH  DAILY 03/12/16  Yes Lucille Passy, MD  Multiple Vitamin (MULTIVITAMIN WITH MINERALS) TABS  tablet Take 1 tablet by mouth daily.   Yes Historical Provider, MD  omeprazole (PRILOSEC) 40 MG capsule TAKE 1 CAPSULE BY MOUTH  EVERY DAY 03/12/16  Yes Lucille Passy, MD  pravastatin (PRAVACHOL) 10 MG tablet TAKE 1 TABLET BY MOUTH  DAILY 03/24/16  Yes Lucille Passy, MD  Probiotic Product (ALIGN) 4 MG CAPS Take 4 mg by mouth daily.   Yes Historical Provider, MD  propranolol (INDERAL) 20 MG tablet Take 20 mg by mouth 2 (two) times daily. 05/14/16  Yes Historical Provider, MD  traMADol (ULTRAM) 50 MG tablet Take 1 tablet (50 mg total) by mouth every 6 (six) hours as needed for moderate pain. 07/03/16  Yes Lucille Passy, MD    Allergies as of 08/09/2016 - Review Complete 08/09/2016  Allergen Reaction Noted  . Latex Hives and Itching 12/08/2011  . Morphine and related Nausea And Vomiting 11/06/2011  . Nickel  08/09/2016    Family History  Problem Relation Age of Onset  . Arrhythmia Mother     pacemaker  . Fainting Mother   . Hypertension Mother   . Hyperlipidemia Mother   . Breast cancer Neg Hx     Social History   Social History  . Marital status: Single    Spouse name: N/A  . Number of children: N/A  . Years of education: N/A   Occupational History  . Not on file.   Social History Main Topics  .  Smoking status: Former Smoker    Packs/day: 1.00    Years: 15.00    Types: Cigarettes  . Smokeless tobacco: Never Used     Comment: quit 1986  . Alcohol use No  . Drug use: No  . Sexual activity: Not on file   Other Topics Concern  . Not on file   Social History Narrative  . No narrative on file    Review of Systems: See HPI, otherwise negative ROS  Physical Exam: BP 130/84   Pulse 65   Temp 97.6 F (36.4 C) (Tympanic)   Resp 18   Ht 5\' 4"  (1.626 m)   Wt 137 lb (62.1 kg)   SpO2 100%   BMI 23.52 kg/m  General:   Alert,  pleasant and cooperative in NAD Head:  Normocephalic and atraumatic. Neck:  Supple; no masses or thyromegaly. Lungs:  Clear throughout to  auscultation.    Heart:  Regular rate and rhythm. Abdomen:  Soft, nontender and nondistended. Normal bowel sounds, without guarding, and without rebound.   Neurologic:  Alert and  oriented x4;  grossly normal neurologically.  Impression/Plan: Krystal Walker is here for an colonoscopy to be performed for GI bleeding   Risks, benefits, limitations, and alternatives regarding  colonoscopy have been reviewed with the patient.  Questions have been answered.  All parties agreeable.   Jonathon Bellows, MD  08/11/2016, 8:01 AM

## 2016-08-12 ENCOUNTER — Encounter: Payer: Self-pay | Admitting: Gastroenterology

## 2016-08-12 ENCOUNTER — Telehealth: Payer: Self-pay | Admitting: *Deleted

## 2016-08-12 NOTE — Anesthesia Postprocedure Evaluation (Signed)
Anesthesia Post Note  Patient: [name]  Procedure(s) Performed: Procedure(s) (LRB): COLONOSCOPY WITH PROPOFOL (N/A)  Patient location during evaluation: PACU Anesthesia Type: General Level of consciousness: awake and alert Pain management: pain level controlled Vital Signs Assessment: post-procedure vital signs reviewed and stable Respiratory status: spontaneous breathing, nonlabored ventilation, respiratory function stable and patient connected to nasal cannula oxygen Cardiovascular status: blood pressure returned to baseline and stable Postop Assessment: no signs of nausea or vomiting Anesthetic complications: no     Last Vitals:  Vitals:   08/11/16 0859 08/11/16 0909  BP: 112/75 113/74  Pulse: 68 62  Resp: 16 14  Temp:      Last Pain:  Vitals:   08/11/16 0839  TempSrc: Tympanic  PainSc:                  Molli Barrows

## 2016-08-12 NOTE — Telephone Encounter (Signed)
PATIENT NAME: Krystal Walker    MR#:  [id number]  DATE OF BIRTH:  1955-07-12  DATE OF ADMISSION:  08/09/2016      ADMITTING PHYSICIAN: Vaughan Basta, MD  DATE OF DISCHARGE: 08/11/2016  PRIMARY CARE PHYSICIAN: Arnette Norris, MD    ADMISSION DIAGNOSIS:  Acute GI bleeding [K92.2]  DISCHARGE DIAGNOSIS:  Gl bleeding suspected due to internal hemorrhoids   Transition Care Management Follow-up Telephone Call   How have you been since you were released from the hospital? "okay"   Do you understand why you were in the hospital? yes   Do you understand the discharge instructions? yes   Where were you discharged to? Home   Items Reviewed:  Medications reviewed: yes  Allergies reviewed: yes  Dietary changes reviewed: yes  Referrals reviewed: yes   Functional Questionnaire:   Activities of Daily Living (ADLs):   She states they are independent in the following: ambulation, bathing and hygiene, feeding, continence, grooming, toileting and dressing States they require assistance with the following: none   Any transportation issues/concerns?: no   Any patient concerns? no   Confirmed importance and date/time of follow-up visits scheduled yes Provider Appointment booked with Dr.Aron 08/18/16 @2 .  Confirmed with patient if condition begins to worsen call PCP or go to the ER.  Patient was given the office number and encouraged to call back with question or concerns.  : yes

## 2016-08-18 ENCOUNTER — Ambulatory Visit: Payer: 59 | Admitting: Family Medicine

## 2016-08-25 ENCOUNTER — Other Ambulatory Visit: Payer: Self-pay | Admitting: Family Medicine

## 2016-08-25 ENCOUNTER — Encounter: Payer: Self-pay | Admitting: Family Medicine

## 2016-08-25 ENCOUNTER — Ambulatory Visit (INDEPENDENT_AMBULATORY_CARE_PROVIDER_SITE_OTHER): Payer: 59 | Admitting: Family Medicine

## 2016-08-25 VITALS — BP 110/80 | HR 75 | Temp 97.7°F | Wt 138.0 lb

## 2016-08-25 DIAGNOSIS — K922 Gastrointestinal hemorrhage, unspecified: Secondary | ICD-10-CM | POA: Diagnosis not present

## 2016-08-25 DIAGNOSIS — K648 Other hemorrhoids: Secondary | ICD-10-CM

## 2016-08-25 DIAGNOSIS — K921 Melena: Secondary | ICD-10-CM

## 2016-08-25 NOTE — Progress Notes (Signed)
Pre visit review using our clinic review tool, if applicable. No additional management support is needed unless otherwise documented below in the visit note. 

## 2016-08-25 NOTE — Assessment & Plan Note (Signed)
Felt to be secondary to bleeding internal hemorrhoids. No further episodes. Advised to keep appt with GI. The patient indicates understanding of these issues and agrees with the plan.

## 2016-08-25 NOTE — Progress Notes (Signed)
Subjective:   Patient ID: [name], female    DOB: 12/01/1955, 61 y.o.   MRN: [id number]  ERIANNA JOLLY is a pleasant 61 y.o. year old female who presents to clinic today with Hospitalization Follow-up (GI Bleeding)  on 08/25/2016  HPI:  Admitted to the hospital 08/09/16- 08/11/16 for acute GI bleed.  Notes reviewed.  Presented with 3 episodes of loss of bowel control, very loose stools.  Found to have melena with slow GI bleed. EGD neg Colonoscopy-  Colonoscopy showed large INternal hemmorhoids. Some diverticuli -pt advised to stay off NSAIDS   H/H stable at discharge.  Did not receive transfusion.  She has follow up scheduled with GI next week.  Has had no further episodes of bowel incontinence.  Lab Results  Component Value Date   WBC 4.0 08/11/2016   HGB 10.9 (L) 08/11/2016   HCT 30.6 (L) 08/11/2016   MCV 92.5 08/11/2016   PLT 140 (L) 08/11/2016   No results found. Current Outpatient Prescriptions on File Prior to Visit  Medication Sig Dispense Refill  . aspirin EC 81 MG tablet Take 81 mg by mouth daily.    . butalbital-acetaminophen-caffeine (FIORICET) 50-325-40 MG tablet Take 1-2 tablets by mouth every 6 (six) hours as needed for headache. 60 tablet 0  . Calcium Carbonate-Vitamin D (CALCIUM 600+D) 600-400 MG-UNIT tablet Take 1 tablet by mouth daily.    . hydrochlorothiazide (HYDRODIURIL) 12.5 MG tablet Take 12.5 mg by mouth daily.    . Multiple Vitamin (MULTIVITAMIN WITH MINERALS) TABS tablet Take 1 tablet by mouth daily.    Marland Kitchen omeprazole (PRILOSEC) 40 MG capsule TAKE 1 CAPSULE BY MOUTH  EVERY DAY 90 capsule 1  . pravastatin (PRAVACHOL) 10 MG tablet TAKE 1 TABLET BY MOUTH  DAILY 90 tablet 3  . Probiotic Product (ALIGN) 4 MG CAPS Take 4 mg by mouth daily.    . propranolol (INDERAL) 20 MG tablet Take 20 mg by mouth 2 (two) times daily.    . traMADol (ULTRAM) 50 MG tablet Take 1 tablet (50 mg total) by mouth every 6 (six) hours as needed for moderate pain. 30  tablet 0   No current facility-administered medications on file prior to visit.     Allergies  Allergen Reactions  . Latex Hives and Itching  . Morphine And Related Nausea And Vomiting  . Nickel     Past Medical History:  Diagnosis Date  . Bowel trouble 1997  . Breast screening, unspecified   . Depression   . GERD (gastroesophageal reflux disease)   . GI bleed   . Hyperlipidemia   . Hypertension   . IBS (irritable bowel syndrome)   . Mammographic microcalcification   . Osteoporosis   . Personal history of tobacco use, presenting hazards to health   . Radiation    for adenoid cystic carcinoma  . Raynaud disease   . Raynaud phenomenon 1982  . 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 Special screening for malignant neoplasms, colon     Past Surgical History:  Procedure Laterality Date  . ABDOMINAL HYSTERECTOMY  2004   partial  . BREAST BIOPSY Left 02-23-12   w/clip - benign  . COLONOSCOPY  2010  . COLONOSCOPY WITH PROPOFOL N/A 08/11/2016   Procedure: COLONOSCOPY WITH PROPOFOL;  Surgeon: Jonathon Bellows, MD;  Location: [facility] ENDOSCOPY;  Service: Endoscopy;  Laterality: N/A;  . ESOPHAGOGASTRODUODENOSCOPY    . ESOPHAGOGASTRODUODENOSCOPY (EGD) WITH PROPOFOL N/A 08/10/2016   Procedure: ESOPHAGOGASTRODUODENOSCOPY (  EGD) WITH PROPOFOL;  Surgeon: Wilford Corner, MD;  Location: [facility] ENDOSCOPY;  Service: Endoscopy;  Laterality: N/A;  . PAROTIDECTOMY Right    adenoid cystic carcinoma  . SALIVARY GLAND SURGERY  1999    Family History  Problem Relation Age of Onset  . Arrhythmia Mother     pacemaker  . Fainting Mother   . Hypertension Mother   . Hyperlipidemia Mother   . Breast cancer Neg Hx     Social History   Social History  . Marital status: Single    Spouse name: N/A  . Number of children: N/A  . Years of education: N/A   Occupational History  . Not on file.   Social History Main Topics  . Smoking status: Former Smoker    Packs/day: 1.00      Years: 15.00    Types: Cigarettes  . Smokeless tobacco: Never Used     Comment: quit 1986  . Alcohol use No  . Drug use: No  . Sexual activity: Not on file   Other Topics Concern  . Not on file   Social History Narrative  . No narrative on file   The PMH, PSH, Social History, Family History, Medications, and allergies have been reviewed in [facility], and have been updated if relevant.   Review of Systems  Constitutional: Negative.   Gastrointestinal: Negative.   Genitourinary: Negative.   Musculoskeletal: Negative.   Neurological: Negative.   Psychiatric/Behavioral: Negative.   All other systems reviewed and are negative.      Objective:    BP 110/80 (BP Location: Left Arm, Patient Position: Sitting, Cuff Size: Normal)   Pulse 75   Temp 97.7 F (36.5 C) (Oral)   Wt 138 lb (62.6 kg)   SpO2 98%   BMI 23.69 kg/m    Physical Exam  Constitutional: She is oriented to person, place, and time. She appears well-developed and well-nourished. No distress.  HENT:  Head: Normocephalic and atraumatic.  Eyes: Conjunctivae are normal.  Cardiovascular: Normal rate.   Pulmonary/Chest: Effort normal.  Musculoskeletal: Normal range of motion.  Neurological: She is alert and oriented to person, place, and time. No cranial nerve deficit.  Skin: Skin is warm and dry. She is not diaphoretic.  Psychiatric: She has a normal mood and affect. Her behavior is normal. Judgment and thought content normal.  Nursing note and vitals reviewed.         Assessment & Plan:   Acute GI bleeding No Follow-up on file.

## 2016-08-25 NOTE — Patient Instructions (Signed)
Great to see you.   Please keep your appointment with GI.

## 2016-08-28 ENCOUNTER — Other Ambulatory Visit: Payer: Self-pay

## 2016-08-28 ENCOUNTER — Other Ambulatory Visit: Payer: Self-pay | Admitting: Family Medicine

## 2016-09-09 ENCOUNTER — Encounter: Payer: Self-pay | Admitting: Gastroenterology

## 2016-09-09 ENCOUNTER — Ambulatory Visit (INDEPENDENT_AMBULATORY_CARE_PROVIDER_SITE_OTHER): Payer: 59 | Admitting: Gastroenterology

## 2016-09-09 VITALS — BP 135/84 | HR 73 | Temp 98.2°F | Ht 64.0 in | Wt 140.6 lb

## 2016-09-09 DIAGNOSIS — K921 Melena: Secondary | ICD-10-CM

## 2016-09-09 NOTE — Progress Notes (Signed)
Primary Care Physician: Arnette Norris, MD  Primary Gastroenterologist:  Dr. Jonathon Bellows   Chief Complaint  Patient presents with  . Follow-up    HPI: Krystal Walker is a 61 y.o. female is here today for a hospital follow up visit. She was admitted on 08/10/16 with melena . Had been on meloxicam as well as an Asprin. EGD was performed and was normal . I subsequently performed a colonoscopy which was a poor prep but did not see any blood or large polyps.   Since discharge she has been doing well. She was recently out of town , had some barbq and had some explosive diarrhea which resolved with imodium and has since resolved.   She said she went back to see Dr Tiffany Kocher for IBS but due to issues with co pay decided to change practices. She says she has had IBS for > 20 years.   She has a bowel movement twice a week , very hard, she needs to strain "like rabbit poop", some pain when stool comes out, no bleeding since she came to the hospital.  She is afraid to eat and only consumes a ripe banana. She has tried miralax and too"extreme", amitiza caused diarrhea.    Current Outpatient Prescriptions  Medication Sig Dispense Refill  . aspirin EC 81 MG tablet Take 81 mg by mouth daily.    . butalbital-acetaminophen-caffeine (FIORICET) 50-325-40 MG tablet Take 1-2 tablets by mouth every 6 (six) hours as needed for headache. 60 tablet 0  . Calcium Carbonate-Vit D-Min (CALCIUM 600+D PLUS MINERALS) 600-400 MG-UNIT TABS Take by mouth.    . hydrochlorothiazide (HYDRODIURIL) 12.5 MG tablet Take 12.5 mg by mouth daily.    . hyoscyamine (LEVSIN, ANASPAZ) 0.125 MG tablet Take by mouth.    . Loperamide HCl (IMODIUM PO) Take by mouth.    . meloxicam (MOBIC) 15 MG tablet TAKE 1 TABLET BY MOUTH  DAILY 90 tablet 0  . Multiple Vitamin (MULTIVITAMIN WITH MINERALS) TABS tablet Take 1 tablet by mouth daily.    Marland Kitchen omeprazole (PRILOSEC) 40 MG capsule TAKE 1 CAPSULE BY MOUTH  EVERY DAY 90 capsule 1  . pravastatin  (PRAVACHOL) 10 MG tablet TAKE 1 TABLET BY MOUTH  DAILY 90 tablet 3  . Probiotic Product (ALIGN) 4 MG CAPS Take 4 mg by mouth daily.    . propranolol (INDERAL) 20 MG tablet Take 20 mg by mouth 2 (two) times daily.    . traMADol (ULTRAM) 50 MG tablet Take 1 tablet (50 mg total) by mouth every 6 (six) hours as needed for moderate pain. 30 tablet 0  . Calcium Carbonate-Vitamin D (CALCIUM 600+D) 600-400 MG-UNIT tablet Take 1 tablet by mouth daily.    . meloxicam (MOBIC) 15 MG tablet Take by mouth.    . nortriptyline (PAMELOR) 10 MG capsule Take 1 capsule at bedtime for 1 week, then increase to 2 capsules at bedtime and continue this dose    . SIMETHICONE PO Take by mouth.    . topiramate (TOPAMAX) 25 MG tablet Take by mouth.     No current facility-administered medications for this visit.     Allergies as of 09/09/2016 - Review Complete 09/09/2016  Allergen Reaction Noted  . Latex Hives and Itching 12/08/2011  . Morphine Nausea And Vomiting 11/06/2011  . Morphine and related Nausea And Vomiting 11/06/2011  . Nickel Other (See Comments) 09/03/2015    ROS:  General: Negative for anorexia, weight loss, fever, chills, fatigue, weakness. ENT: Negative for hoarseness, difficulty  swallowing , nasal congestion. CV: Negative for chest pain, angina, palpitations, dyspnea on exertion, peripheral edema.  Respiratory: Negative for dyspnea at rest, dyspnea on exertion, cough, sputum, wheezing.  GI: See history of present illness. GU:  Negative for dysuria, hematuria, urinary incontinence, urinary frequency, nocturnal urination.  Endo: Negative for unusual weight change.    Physical Examination:   BP 135/84 (BP Location: Left Arm, Patient Position: Sitting, Cuff Size: Normal)   Pulse 73   Temp 98.2 F (36.8 C) (Oral)   Ht 5\' 4"  (1.626 m)   Wt 140 lb 9.6 oz (63.8 kg)   BMI 24.13 kg/m   General: Well-nourished, well-developed in no acute distress.  Eyes: No icterus. Conjunctivae pink. Mouth:  Oropharyngeal mucosa moist and pink , no lesions erythema or exudate. Lungs: Clear to auscultation bilaterally. Non-labored. Heart: Regular rate and rhythm, no murmurs rubs or gallops.  Abdomen: Bowel sounds are normal, nontender, nondistended, no hepatosplenomegaly or masses, no abdominal bruits or hernia , no rebound or guarding.   Extremities: No lower extremity edema. No clubbing or deformities. Neuro: Alert and oriented x 3.  Grossly intact. Skin: Warm and dry, no jaundice.   Psych: Alert and cooperative, normal mood and affect.   Imaging Studies: No results found.  Assessment and Plan:   Krystal Walker is a 61 y.o. y/o female here to follow up for hospital discharge when she came in with melena while on Meloxicam . Since discharge doing well. She also has long standing constipation likely due to low fiber in her diet .   Plan   1. Continue fiber supplement 2. Consume more nuts, handful of veggies 3. Half capful of miralax 4. F/u in 2 weeks if no better then will try liness low dose 5. Maintain food dairy so I can asess if her diet is adequately rich in fiber .Patient information given on high fiber diet   Dr Jonathon Bellows  MD Follow up in 2 weeks

## 2016-09-10 LAB — CBC WITH DIFFERENTIAL/PLATELET
BASOS: 1 %
Basophils Absolute: 0 10*3/uL (ref 0.0–0.2)
EOS (ABSOLUTE): 0.1 10*3/uL (ref 0.0–0.4)
EOS: 3 %
HEMATOCRIT: 34.3 % (ref 34.0–46.6)
HEMOGLOBIN: 11.5 g/dL (ref 11.1–15.9)
IMMATURE GRANS (ABS): 0 10*3/uL (ref 0.0–0.1)
IMMATURE GRANULOCYTES: 0 %
LYMPHS: 41 %
Lymphocytes Absolute: 1.3 10*3/uL (ref 0.7–3.1)
MCH: 30.6 pg (ref 26.6–33.0)
MCHC: 33.5 g/dL (ref 31.5–35.7)
MCV: 91 fL (ref 79–97)
MONOCYTES: 9 %
Monocytes Absolute: 0.3 10*3/uL (ref 0.1–0.9)
NEUTROS PCT: 46 %
Neutrophils Absolute: 1.5 10*3/uL (ref 1.4–7.0)
Platelets: 185 10*3/uL (ref 150–379)
RBC: 3.76 x10E6/uL — AB (ref 3.77–5.28)
RDW: 12.8 % (ref 12.3–15.4)
WBC: 3.2 10*3/uL — AB (ref 3.4–10.8)

## 2016-09-16 ENCOUNTER — Telehealth: Payer: Self-pay

## 2016-09-16 NOTE — Telephone Encounter (Signed)
Advised pt of results per Dr. Vicente Males.   Pt states taking Miralax every other day due to diarrhea otherwise.

## 2016-09-16 NOTE — Telephone Encounter (Signed)
-----   Message from Jonathon Bellows, MD sent at 09/15/2016 10:10 AM EDT ----- Hb has improved, inform to continue high fiber diet for constipation

## 2016-10-01 ENCOUNTER — Other Ambulatory Visit: Payer: Self-pay | Admitting: Family Medicine

## 2016-10-13 ENCOUNTER — Ambulatory Visit (INDEPENDENT_AMBULATORY_CARE_PROVIDER_SITE_OTHER): Payer: 59 | Admitting: Gastroenterology

## 2016-10-13 ENCOUNTER — Encounter: Payer: Self-pay | Admitting: Gastroenterology

## 2016-10-13 ENCOUNTER — Other Ambulatory Visit: Payer: Self-pay

## 2016-10-13 VITALS — BP 115/79 | HR 65 | Temp 98.0°F | Ht 64.0 in | Wt 140.6 lb

## 2016-10-13 DIAGNOSIS — K648 Other hemorrhoids: Secondary | ICD-10-CM

## 2016-10-13 DIAGNOSIS — K581 Irritable bowel syndrome with constipation: Secondary | ICD-10-CM | POA: Diagnosis not present

## 2016-10-13 NOTE — Progress Notes (Signed)
Primary Care Physician: Lucille Passy, MD  Primary Gastroenterologist:  Dr. Jonathon Bellows   No chief complaint on file.   HPI: Krystal Walker is a 61 y.o. female    Summary of history :  Krystal Walker is a 61 y.o. female is here today for follow up . She was admitted on 08/10/16 with melena . Had been on meloxicam as well as an Asprin. EGD was performed and was normal . I subsequently performed a colonoscopy which was a poor prep but did not see any blood or large polyps.  She carries a diagn0sis of IBS for > 20 years and used to follow with Dr Tiffany Kocher. On her initial follow up it appeared her dietary content of fiber was very low and I felt was playing a large part with her symptoms and constipation    Interval history 09/2016-10/2016   Taking Benefiber BID, increased her dietary fiber Taking miralax- 1-2 times a week , abdominal pain is much better. A week back had some rectal bleeding on her pants, she is using a sitz bath and it has helped.   Current Outpatient Prescriptions  Medication Sig Dispense Refill  . aspirin EC 81 MG tablet Take 81 mg by mouth daily.    . butalbital-acetaminophen-caffeine (FIORICET) 50-325-40 MG tablet Take 1-2 tablets by mouth every 6 (six) hours as needed for headache. 60 tablet 0  . Calcium Carbonate-Vit D-Min (CALCIUM 600+D PLUS MINERALS) 600-400 MG-UNIT TABS Take by mouth.    . Calcium Carbonate-Vitamin D (CALCIUM 600+D) 600-400 MG-UNIT tablet Take 1 tablet by mouth daily.    . hydrochlorothiazide (HYDRODIURIL) 12.5 MG tablet Take 12.5 mg by mouth daily.    . hyoscyamine (LEVSIN, ANASPAZ) 0.125 MG tablet Take by mouth.    . Loperamide HCl (IMODIUM PO) Take by mouth.    . meloxicam (MOBIC) 15 MG tablet TAKE 1 TABLET BY MOUTH  DAILY 90 tablet 0  . meloxicam (MOBIC) 15 MG tablet Take by mouth.    . Multiple Vitamin (MULTIVITAMIN WITH MINERALS) TABS tablet Take 1 tablet by mouth daily.    . nortriptyline (PAMELOR) 10 MG capsule Take 1 capsule at bedtime  for 1 week, then increase to 2 capsules at bedtime and continue this dose    . omeprazole (PRILOSEC) 40 MG capsule TAKE 1 CAPSULE BY MOUTH  EVERY DAY 90 capsule 1  . pravastatin (PRAVACHOL) 10 MG tablet TAKE 1 TABLET BY MOUTH  DAILY 90 tablet 3  . Probiotic Product (ALIGN) 4 MG CAPS Take 4 mg by mouth daily.    . propranolol (INDERAL) 20 MG tablet Take 20 mg by mouth 2 (two) times daily.    Marland Kitchen SIMETHICONE PO Take by mouth.    . topiramate (TOPAMAX) 25 MG tablet Take by mouth.    . traMADol (ULTRAM) 50 MG tablet Take 1 tablet (50 mg total) by mouth every 6 (six) hours as needed for moderate pain. 30 tablet 0   No current facility-administered medications for this visit.     Allergies as of 10/13/2016 - Review Complete 09/09/2016  Allergen Reaction Noted  . Latex Hives and Itching 12/08/2011  . Morphine Nausea And Vomiting 11/06/2011  . Morphine and related Nausea And Vomiting 11/06/2011  . Nickel Other (See Comments) 09/03/2015    ROS:  General: Negative for anorexia, weight loss, fever, chills, fatigue, weakness. ENT: Negative for hoarseness, difficulty swallowing , nasal congestion. CV: Negative for chest pain, angina, palpitations, dyspnea on exertion, peripheral edema.  Respiratory: Negative  for dyspnea at rest, dyspnea on exertion, cough, sputum, wheezing.  GI: See history of present illness. GU:  Negative for dysuria, hematuria, urinary incontinence, urinary frequency, nocturnal urination.  Endo: Negative for unusual weight change.    Physical Examination:   There were no vitals taken for this visit.  General: Well-nourished, well-developed in no acute distress.  Eyes: No icterus. Conjunctivae pink. Mouth: Oropharyngeal mucosa moist and pink , no lesions erythema or exudate. Lungs: Clear to auscultation bilaterally. Non-labored. Heart: Regular rate and rhythm, no murmurs rubs or gallops.  Abdomen: Bowel sounds are normal, nontender, nondistended, no hepatosplenomegaly or  masses, no abdominal bruits or hernia , no rebound or guarding.   Extremities: No lower extremity edema. No clubbing or deformities. Neuro: Alert and oriented x 3.  Grossly intact. Skin: Warm and dry, no jaundice.   Psych: Alert and cooperative, normal mood and affect.  Imaging Studies: No results found.  Assessment and Plan:   Krystal Walker is a 61 y.o. 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     Plan   1. Continue fiber supplement, increased dietary fiber, PRN miralax.  2. Refer to colorectal surgery for treatment options for bleeding hemorroids.   Dr Jonathon Bellows  MD Follow up in 6 months

## 2016-10-27 ENCOUNTER — Ambulatory Visit (INDEPENDENT_AMBULATORY_CARE_PROVIDER_SITE_OTHER): Payer: 59 | Admitting: Surgery

## 2016-10-27 ENCOUNTER — Encounter: Payer: Self-pay | Admitting: Surgery

## 2016-10-27 VITALS — BP 127/79 | HR 69 | Temp 98.7°F | Ht 64.0 in | Wt 141.0 lb

## 2016-10-27 DIAGNOSIS — K648 Other hemorrhoids: Secondary | ICD-10-CM

## 2016-10-27 NOTE — Patient Instructions (Signed)
We have scheduled your surgery with Dr. Rosana Hoes for 11/19/16.  Please see your blue sheet for further instructions.

## 2016-10-27 NOTE — Progress Notes (Signed)
Surgical Clinic History and Physical  Referring provider:  Lucille Passy, MD [place], [place] [address]  HISTORY OF PRESENT ILLNESS (HPI):  61 y.o. female presents upon referral from gastroenterologist (Dr. Jonathon Bellows) for evaluation and management of patient's bleeding internal hemorrhoids that patient states she has notice for "years", but more recently they have been bleeding more frequently, and patient reports spending long amounts of time wiping after BM's, feeling "unable to get clean" attributed to the hemorrhoids that she feels and irritates while wiping. Patient reports her BM's have become softer and more regular (~once daily) since she began increasing fiber in her diet, along with Benefiber BID and Miralax x 1 - 2 per week as advised by GI physician. Patient has also been able to reduce her rectal bleeding with frequent sitz baths, but despite this, she reports that she continues to restrict her eating to reduce her BM's due to fear of hemorrhoidal inflammation, bleeding, and feeling unable to maintain adequate peri-anal hygiene due to her hemorrhoids. She otherwise denies fever/chills, CP, or SOB and recently underwent EGD and colonoscopy for melena, the latter of which demonstrated only large internal hemorrhoid and diverticulosis.  PAST MEDICAL HISTORY (PMH):  Past Medical History:  Diagnosis Date  . Bowel trouble 1997  . Breast screening, unspecified   . Depression   . GERD (gastroesophageal reflux disease)   . GI bleed   . Hyperlipidemia   . Hypertension   . IBS (irritable bowel syndrome)   . Mammographic microcalcification   . Osteoporosis   . Personal history of tobacco use, presenting hazards to health   . Radiation    for adenoid cystic carcinoma  . Raynaud disease   . Raynaud phenomenon 1982  . 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 Special screening for malignant neoplasms, colon      PAST SURGICAL HISTORY (Jessie):   Past Surgical History:  Procedure Laterality Date  . ABDOMINAL HYSTERECTOMY  2004   partial  . BREAST BIOPSY Left 02-23-12   w/clip - benign  . COLONOSCOPY  2010  . COLONOSCOPY WITH PROPOFOL N/A 08/11/2016   Procedure: COLONOSCOPY WITH PROPOFOL;  Surgeon: Jonathon Bellows, MD;  Location: [facility] ENDOSCOPY;  Service: Endoscopy;  Laterality: N/A;  . ESOPHAGOGASTRODUODENOSCOPY    . ESOPHAGOGASTRODUODENOSCOPY (EGD) WITH PROPOFOL N/A 08/10/2016   Procedure: ESOPHAGOGASTRODUODENOSCOPY (EGD) WITH PROPOFOL;  Surgeon: Wilford Corner, MD;  Location: [facility] ENDOSCOPY;  Service: Endoscopy;  Laterality: N/A;  . PAROTIDECTOMY Right    adenoid cystic carcinoma  . SALIVARY GLAND SURGERY  1999     MEDICATIONS:  Prior to Admission medications   Medication Sig Start Date End Date Taking? Authorizing Provider  aspirin EC 81 MG tablet Take 81 mg by mouth daily.   Yes [provider]  butalbital-acetaminophen-caffeine (FIORICET) 50-325-40 MG tablet Take 1-2 tablets by mouth every 6 (six) hours as needed for headache. 02/14/16 02/13/17 Yes Lucille Passy, MD  Calcium Carbonate-Vitamin D (CALCIUM 600+D) 600-400 MG-UNIT tablet Take 1 tablet by mouth daily.   Yes [provider]  hydrochlorothiazide (HYDRODIURIL) 12.5 MG tablet Take 12.5 mg by mouth daily. 08/05/16 11/03/16 Yes [provider]  hyoscyamine (LEVSIN, ANASPAZ) 0.125 MG tablet Take by mouth. 03/19/16  Yes [provider]  meloxicam (MOBIC) 15 MG tablet TAKE 1 TABLET BY MOUTH  DAILY 08/28/16  Yes Lucille Passy, MD  Multiple Vitamin (MULTIVITAMIN WITH MINERALS) TABS tablet Take 1 tablet by mouth daily.   Yes [provider]  omeprazole (PRILOSEC) 40 MG capsule TAKE 1 CAPSULE BY MOUTH  EVERY DAY 08/28/16  Yes Lucille Passy, MD  pravastatin (PRAVACHOL) 10 MG tablet TAKE 1 TABLET BY MOUTH  DAILY 03/24/16  Yes Lucille Passy, MD  Probiotic Product (ALIGN) 4 MG CAPS Take 4 mg by mouth daily.   Yes [provider]   propranolol (INDERAL) 20 MG tablet Take 20 mg by mouth 2 (two) times daily. 05/14/16  Yes [provider]  SIMETHICONE PO Take by mouth.   Yes [provider]  traMADol (ULTRAM) 50 MG tablet Take 1 tablet (50 mg total) by mouth every 6 (six) hours as needed for moderate pain. 07/03/16  Yes Lucille Passy, MD     ALLERGIES:  Allergies  Allergen Reactions  . Latex Hives and Itching  . Morphine Nausea And Vomiting  . Morphine And Related Nausea And Vomiting  . Nickel Other (See Comments)     SOCIAL HISTORY:  Social History   Social History  . Marital status: Single    Spouse name: N/A  . Number of children: N/A  . Years of education: N/A   Occupational History  . Not on file.   Social History Main Topics  . Smoking status: Former Smoker    Packs/day: 1.00    Years: 15.00    Types: Cigarettes  . Smokeless tobacco: Never Used     Comment: quit 1986  . Alcohol use No  . Drug use: No  . Sexual activity: Not on file   Other Topics Concern  . Not on file   Social History Narrative  . No narrative on file    The patient currently resides (home / rehab facility / nursing home): Home  The patient normally is (ambulatory / bedbound) : Ambulatory   FAMILY HISTORY:  Family History  Problem Relation Age of Onset  . Arrhythmia Mother        pacemaker  . Fainting Mother   . Hypertension Mother   . Hyperlipidemia Mother   . Breast cancer Neg Hx     Otherwise negative/non-contributory.  REVIEW OF SYSTEMS:  Constitutional: denies any other weight loss, fever, chills, or sweats  Eyes: denies any other vision changes, history of eye injury  ENT: denies sore throat, hearing problems  Respiratory: denies shortness of breath, wheezing  Cardiovascular: denies chest pain, palpitations  Gastrointestinal: abdominal pain, N/V, and bowel function as per HPI Musculoskeletal: denies any other joint pains or cramps  Skin: Denies any other rashes or skin discolorations   Neurological: denies any other headache, dizziness, weakness  Psychiatric: Denies any other depression, anxiety   All other review of systems were otherwise negative   VITAL SIGNS:  @VSRANGES @     Height: 5\' 4"  (162.6 cm) Weight: 141 lb (64 kg) BMI (Calculated): 24.3   PHYSICAL EXAM:  Constitutional:  -- Normal body habitus  -- Awake, alert, and oriented x3  Eyes:  -- Pupils equally round and reactive to light  -- No scleral icterus  Ear, nose, throat:  -- No jugular venous distension -- No nasal drainage, bleeding Pulmonary:  -- No crackles  -- Equal breath sounds bilaterally -- Breathing non-labored at rest Cardiovascular:  -- S1, S2 present  -- No pericardial rubs  Gastrointestinal:  -- Abdomen soft, nontender, nondistended, no guarding/rebound  -- No abdominal masses appreciated, pulsatile or otherwise  Anorectal: -- Anal sphincter tone WNL with minimal gross blood, large mildly tender non-prolapsed internal hemorrhoids, minimal stool, no  fissures or fistulae Musculoskeletal and Integumentary:  -- Wounds or skin discoloration: None appreciated -- Extremities: B/L UE and LE FROM, hands and feet warm, no edema  Neurologic:  -- Motor function: Intact and symmetric -- Sensation: Intact and symmetric  Labs:  CBC:  Lab Results  Component Value Date   WBC 3.2 (L) 09/09/2016   WBC 4.0 08/11/2016   RBC 3.76 (L) 09/09/2016   RBC 3.31 (L) 08/11/2016   HEMOGLOBIN 11.5 09/09/2016   BMP:  Lab Results  Component Value Date   GLUCOSE 96 08/10/2016   CO2 26 08/10/2016   BUN 11 08/10/2016   BUN 21 07/04/2013   CREATININE 0.74 08/10/2016   CALCIUM 8.9 08/10/2016     Assessment/Plan:  61 y.o. 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   - agree with and continue bowel regimen and sitz baths as per GI  - all risks (including but not limited to pain, bleeding, fecal incontinence, and recurrence), benefits, and alternatives to elective hemorrhoidectomy discussed at length with patient, all of her questions were answered to her expressed satisfaction, patient expresses wish to proceed, and informed consent was accordingly obtained  - will plan for outpatient elective hemorrhoidectomy 6/13   - return to clinic 2 weeks after above planned surgery  All of the above recommendations were discussed with the patient, and all of patient's questions were answered to her expressed satisfaction.  Thank you for the opportunity to participate in this patient's care.  -- Marilynne Drivers Rosana Hoes, MD, Jeff: [facility] - Partnering for exceptional care. Office: [phone]

## 2016-10-28 ENCOUNTER — Telehealth: Payer: Self-pay

## 2016-10-28 NOTE — Telephone Encounter (Signed)
Pt advised of pre op date/time and sx date. Sx: 11/19/2016 with Dr. Rosana Hoes -- Hemorrhoidectomy Telephone pre op: 11/11/16 between 9-1  Patient made aware to call [phone], between 1:00 - 3:00 the day before surgery, to find out what time to arrive.    Authorization #: [id number] Physician Estimate for surgery: $ 43.70

## 2016-10-29 NOTE — Addendum Note (Signed)
Addended by: Priscille Heidelberg on: 10/29/2016 03:43 PM   Modules accepted: Orders, SmartSet

## 2016-11-05 ENCOUNTER — Telehealth: Payer: Self-pay | Admitting: Surgery

## 2016-11-05 NOTE — Telephone Encounter (Signed)
Patient has called and would like to cancel surgery scheduled with Dr Rosana Hoes on 11/19/16 -- Hemorrhoidectomy. She states that she can not take time off of work at this time. She will contact our office in the future to make another appointment.   I have contact the OR and Pre Admit to cancel all appointments.

## 2016-11-10 ENCOUNTER — Inpatient Hospital Stay: Admission: RE | Admit: 2016-11-10 | Payer: 59 | Source: Ambulatory Visit

## 2016-11-11 ENCOUNTER — Inpatient Hospital Stay: Admission: RE | Admit: 2016-11-11 | Payer: 59 | Source: Ambulatory Visit

## 2016-11-16 ENCOUNTER — Other Ambulatory Visit: Payer: Self-pay | Admitting: Family Medicine

## 2016-11-17 NOTE — Telephone Encounter (Signed)
Last refill 08/28/16, last OV 08/25/16

## 2016-11-19 ENCOUNTER — Ambulatory Visit: Admit: 2016-11-19 | Payer: 59 | Admitting: Surgery

## 2016-11-19 SURGERY — HEMORRHOIDECTOMY
Anesthesia: Choice

## 2016-11-20 ENCOUNTER — Emergency Department
Admission: EM | Admit: 2016-11-20 | Discharge: 2016-11-20 | Disposition: A | Discharge status: Home / Self Care | Payer: 59 | Attending: Emergency Medicine | Admitting: Emergency Medicine

## 2016-11-20 ENCOUNTER — Emergency Department: Payer: 59

## 2016-11-20 ENCOUNTER — Encounter: Payer: Self-pay | Admitting: Emergency Medicine

## 2016-11-20 DIAGNOSIS — Z91040 Latex allergy status: Secondary | ICD-10-CM | POA: Diagnosis not present

## 2016-11-20 DIAGNOSIS — Y939 Activity, unspecified: Secondary | ICD-10-CM | POA: Insufficient documentation

## 2016-11-20 DIAGNOSIS — W0110XA Fall on same level from slipping, tripping and stumbling with subsequent striking against unspecified object, initial encounter: Secondary | ICD-10-CM | POA: Insufficient documentation

## 2016-11-20 DIAGNOSIS — R55 Syncope and collapse: Secondary | ICD-10-CM | POA: Diagnosis not present

## 2016-11-20 DIAGNOSIS — Y999 Unspecified external cause status: Secondary | ICD-10-CM | POA: Diagnosis not present

## 2016-11-20 DIAGNOSIS — Y929 Unspecified place or not applicable: Secondary | ICD-10-CM | POA: Diagnosis not present

## 2016-11-20 DIAGNOSIS — S0001XA Abrasion of scalp, initial encounter: Secondary | ICD-10-CM | POA: Diagnosis not present

## 2016-11-20 DIAGNOSIS — Z87891 Personal history of nicotine dependence: Secondary | ICD-10-CM | POA: Insufficient documentation

## 2016-11-20 DIAGNOSIS — S0990XA Unspecified injury of head, initial encounter: Secondary | ICD-10-CM

## 2016-11-20 DIAGNOSIS — S0083XA Contusion of other part of head, initial encounter: Secondary | ICD-10-CM | POA: Insufficient documentation

## 2016-11-20 DIAGNOSIS — I10 Essential (primary) hypertension: Secondary | ICD-10-CM | POA: Insufficient documentation

## 2016-11-20 LAB — URINALYSIS, COMPLETE (UACMP) WITH MICROSCOPIC
BACTERIA UA: NONE SEEN
Bilirubin Urine: NEGATIVE
Glucose, UA: NEGATIVE mg/dL
HGB URINE DIPSTICK: NEGATIVE
Ketones, ur: 5 mg/dL — AB
Leukocytes, UA: NEGATIVE
NITRITE: NEGATIVE
PH: 7 (ref 5.0–8.0)
Protein, ur: NEGATIVE mg/dL
SPECIFIC GRAVITY, URINE: 1.003 — AB (ref 1.005–1.030)
WBC, UA: NONE SEEN WBC/hpf (ref 0–5)

## 2016-11-20 LAB — CBC WITH DIFFERENTIAL/PLATELET
Basophils Absolute: 0 10*3/uL (ref 0–0.1)
Basophils Relative: 0 %
Eosinophils Absolute: 0.1 10*3/uL (ref 0–0.7)
Eosinophils Relative: 1 %
HEMATOCRIT: 39.1 % (ref 35.0–47.0)
HEMOGLOBIN: 13.7 g/dL (ref 12.0–16.0)
LYMPHS ABS: 0.7 10*3/uL — AB (ref 1.0–3.6)
LYMPHS PCT: 9 %
MCH: 30.7 pg (ref 26.0–34.0)
MCHC: 35 g/dL (ref 32.0–36.0)
MCV: 87.8 fL (ref 80.0–100.0)
MONOS PCT: 8 %
Monocytes Absolute: 0.7 10*3/uL (ref 0.2–0.9)
NEUTROS ABS: 6.9 10*3/uL — AB (ref 1.4–6.5)
NEUTROS PCT: 82 %
Platelets: 190 10*3/uL (ref 150–440)
RBC: 4.45 MIL/uL (ref 3.80–5.20)
RDW: 14.2 % (ref 11.5–14.5)
WBC: 8.4 10*3/uL (ref 3.6–11.0)

## 2016-11-20 LAB — COMPREHENSIVE METABOLIC PANEL
ALK PHOS: 91 U/L (ref 38–126)
ALT: 43 U/L (ref 14–54)
AST: 61 U/L — ABNORMAL HIGH (ref 15–41)
Albumin: 4.4 g/dL (ref 3.5–5.0)
Anion gap: 10 (ref 5–15)
BILIRUBIN TOTAL: 0.9 mg/dL (ref 0.3–1.2)
BUN: 14 mg/dL (ref 6–20)
CALCIUM: 9.7 mg/dL (ref 8.9–10.3)
CO2: 26 mmol/L (ref 22–32)
CREATININE: 0.95 mg/dL (ref 0.44–1.00)
Chloride: 97 mmol/L — ABNORMAL LOW (ref 101–111)
Glucose, Bld: 111 mg/dL — ABNORMAL HIGH (ref 65–99)
Potassium: 3.8 mmol/L (ref 3.5–5.1)
SODIUM: 133 mmol/L — AB (ref 135–145)
TOTAL PROTEIN: 7.6 g/dL (ref 6.5–8.1)

## 2016-11-20 LAB — TROPONIN I

## 2016-11-20 MED ORDER — SODIUM CHLORIDE 0.9 % IV SOLN
Freq: Once | INTRAVENOUS | Status: AC
  Administered 2016-11-20: 11:00:00 via INTRAVENOUS

## 2016-11-20 NOTE — ED Provider Notes (Signed)
[facility] Emergency Department Provider Note       Time seen: ----------------------------------------- 9:47 AM on 11/20/2016 -----------------------------------------     I have reviewed the triage vital signs and the nursing notes.   HISTORY   Chief Complaint No chief complaint on file.    HPI Krystal Walker is a 61 y.o. female who presents to the ED for a fall. Patient states she went to use the bathroom and was taking tape off of her right eye from recent eye surgery when she began to feel her blood pressure dropping. She subsequently fell and struck her face on a shelf. She is complaining of forehead pain, otherwise denies any injuries from the fall. She recently had retinal surgery and has been instructed to lay prone until follow-up with ophthalmology.   Past Medical History:  Diagnosis Date  . Bowel trouble 1997  . Breast screening, unspecified   . Depression   . GERD (gastroesophageal reflux disease)   . GI bleed   . Hyperlipidemia   . Hypertension   . IBS (irritable bowel syndrome)   . Mammographic microcalcification   . Osteoporosis   . Personal history of tobacco use, presenting hazards to health   . Radiation    for adenoid cystic carcinoma  . Raynaud disease   . Raynaud phenomenon 1982  . 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 Special screening for malignant neoplasms, colon     Patient Active Problem List   Diagnosis Date Noted  . Acute GI bleeding   . Diverticulosis of large intestine without diverticulitis   . Melena 08/09/2016  . Chronic tension-type headache, not intractable 09/03/2015  . Frequent headaches 08/13/2015  . Work-related stress 08/13/2015  . Fibrocystic disease of breast 09/07/2012  . Raynaud phenomenon 04/12/2012  . IBS (irritable bowel syndrome) 04/12/2012  . Essential hypertension   . Hyperlipidemia   . Depression   . GERD (gastroesophageal reflux disease)     Past  Surgical History:  Procedure Laterality Date  . ABDOMINAL HYSTERECTOMY  2004   partial  . BREAST BIOPSY Left 02-23-12   w/clip - benign  . COLONOSCOPY  2010  . COLONOSCOPY WITH PROPOFOL N/A 08/11/2016   Procedure: COLONOSCOPY WITH PROPOFOL;  Surgeon: Jonathon Bellows, MD;  Location: [facility] ENDOSCOPY;  Service: Endoscopy;  Laterality: N/A;  . ESOPHAGOGASTRODUODENOSCOPY    . ESOPHAGOGASTRODUODENOSCOPY (EGD) WITH PROPOFOL N/A 08/10/2016   Procedure: ESOPHAGOGASTRODUODENOSCOPY (EGD) WITH PROPOFOL;  Surgeon: Wilford Corner, MD;  Location: [facility] ENDOSCOPY;  Service: Endoscopy;  Laterality: N/A;  . PAROTIDECTOMY Right    adenoid cystic carcinoma  . SALIVARY GLAND SURGERY  1999    Allergies Latex; Morphine; Morphine and related; and Nickel  Social History Social History  Substance Use Topics  . Smoking status: Former Smoker    Packs/day: 1.00    Years: 15.00    Types: Cigarettes  . Smokeless tobacco: Never Used     Comment: quit 1986  . Alcohol use No    Review of Systems Constitutional: Negative for fever. Eyes: Negative for Acute vision changes ENT:  Negative for congestion, sore throat Cardiovascular: Negative for chest pain. Respiratory: Negative for shortness of breath. Gastrointestinal: Negative for abdominal pain, vomiting and diarrhea. Genitourinary: Negative for dysuria. Musculoskeletal: Negative for back pain. Skin: Positive for forehead contusion Neurological: Positive for headache  All systems negative/normal/unremarkable except as stated in the HPI  ____________________________________________   PHYSICAL EXAM:  VITAL SIGNS: ED Triage Vitals  Enc Vitals Group  BP      Pulse      Resp      Temp      Temp src      SpO2      Weight      Height      Head Circumference      Peak Flow      Pain Score      Pain Loc      Pain Edu?      Excl. in [place]?     Constitutional: Alert and oriented. Well appearing and in no distress. Eyes: Subconjunctival hemorrhage and  injection on the right, Normal extraocular movements. ENT   Head: Normocephalic, Midline scalp abrasion and contusion at the hairline   Nose: No congestion/rhinnorhea.   Mouth/Throat: Mucous membranes are moist.   Neck: No stridor. Cardiovascular: Normal rate, regular rhythm. No murmurs, rubs, or gallops. Respiratory: Normal respiratory effort without tachypnea nor retractions. Breath sounds are clear and equal bilaterally. No wheezes/rales/rhonchi. Gastrointestinal: Soft and nontender. Normal bowel sounds Musculoskeletal: Nontender with normal range of motion in extremities. No lower extremity tenderness nor edema. Neurologic:  Normal speech and language. No gross focal neurologic deficits are appreciated.  Skin:  Scalp abrasion and contusion Psychiatric: Mood and affect are normal. Speech and behavior are normal.  ____________________________________________  EKG: Interpreted by me. Sinus rhythm rate of 93 bpm, normal. We'll, normal QRS, normal QT. Nonspecific T-wave changes  ____________________________________________  ED COURSE:  Pertinent labs & imaging results that were available during my care of the patient were reviewed by me and considered in my medical decision making (see chart for details). Patient presents for syncope or near syncope with head injury, we will assess with labs and imaging as indicated. Clinical Course as of Nov 20 1252  Thu Nov 20, 2016  1221 Patient was able to ambulate without any difficulty  [JW]    Clinical Course User Index [JW] Earleen Newport, MD   Procedures ____________________________________________   LABS (pertinent positives/negatives)  Labs Reviewed  CBC WITH DIFFERENTIAL/PLATELET - Abnormal; Notable for the following:       Result Value   Neutro Abs 6.9 (*)    Lymphs Abs 0.7 (*)    All other components within normal limits  COMPREHENSIVE METABOLIC PANEL - Abnormal; Notable for the following:    Sodium 133 (*)     Chloride 97 (*)    Glucose, Bld 111 (*)    AST 61 (*)    All other components within normal limits  URINALYSIS, COMPLETE (UACMP) WITH MICROSCOPIC - Abnormal; Notable for the following:    Color, Urine STRAW (*)    APPearance CLEAR (*)    Specific Gravity, Urine 1.003 (*)    Ketones, ur 5 (*)    Squamous Epithelial / LPF 0-5 (*)    All other components within normal limits  TROPONIN I    RADIOLOGY  CT head IMPRESSION: No acute intracranial abnormality or acute traumatic injury identified.  Normal for age non contrast CT appearance of the brain. ____________________________________________  FINAL ASSESSMENT AND PLAN  Fall, head injury, syncope  Plan: Patient's labs and imaging were dictated above. Patient had presented for syncope or near syncope which appears vasovagal in origin. We gave her a liter of fluids and have observed her without any recurrence of her symptoms. She was able to ambulate and go to the restroom without difficulty. No specific other abnormality was identified, she is stable for outpatient follow-up.   Earleen Newport,  MD   Note: This note was generated in part or whole with voice recognition software. Voice recognition is usually quite accurate but there are transcription errors that can and very often do occur. I apologize for any typographical errors that were not detected and corrected.     Earleen Newport, MD 11/20/16 [phone]

## 2016-11-20 NOTE — ED Triage Notes (Signed)
Ems from home for syncopal event with fall. Pt was sitting on toilet, felt her pressure dropping and stood up to try to get to phone. Pt a/o. Red area above right eye and abrasion chin. Pt s/p retina tear surgery on Monday.

## 2016-12-03 ENCOUNTER — Other Ambulatory Visit: Payer: Self-pay | Admitting: Family Medicine

## 2016-12-03 DIAGNOSIS — Z1231 Encounter for screening mammogram for malignant neoplasm of breast: Secondary | ICD-10-CM

## 2016-12-15 ENCOUNTER — Ambulatory Visit
Admission: RE | Admit: 2016-12-15 | Discharge: 2016-12-15 | Disposition: A | Discharge status: Home / Self Care | Payer: 59 | Source: Ambulatory Visit | Attending: Family Medicine | Admitting: Family Medicine

## 2016-12-15 ENCOUNTER — Ambulatory Visit: Payer: 59 | Admitting: Gastroenterology

## 2016-12-15 DIAGNOSIS — Z1231 Encounter for screening mammogram for malignant neoplasm of breast: Secondary | ICD-10-CM | POA: Diagnosis not present

## 2016-12-29 ENCOUNTER — Other Ambulatory Visit: Payer: Self-pay | Admitting: Family Medicine

## 2016-12-29 MED ORDER — BUTALBITAL-APAP-CAFFEINE 50-325-40 MG PO TABS: 1.0000 | ORAL_TABLET | Freq: Four times a day (QID) | ORAL | 0 refills | Status: DC | PRN

## 2016-12-29 NOTE — Telephone Encounter (Signed)
Faxed  To [facility]

## 2016-12-29 NOTE — Telephone Encounter (Signed)
Last refill 02/14/16 #60  Last OV 08/25/16 Ok to refill?

## 2017-01-01 ENCOUNTER — Other Ambulatory Visit: Payer: Self-pay | Admitting: Family Medicine

## 2017-01-02 NOTE — Telephone Encounter (Signed)
Called into [facility] - [place], [place] - [address]: [phone]  

## 2017-01-02 NOTE — Telephone Encounter (Signed)
Last refill 07/03/16 Last OV 08/25/16  Ok to refill?

## 2017-01-12 ENCOUNTER — Ambulatory Visit (INDEPENDENT_AMBULATORY_CARE_PROVIDER_SITE_OTHER): Payer: 59 | Admitting: Family Medicine

## 2017-01-12 ENCOUNTER — Encounter: Payer: Self-pay | Admitting: Family Medicine

## 2017-01-12 VITALS — BP 110/80 | HR 80 | Ht 64.75 in | Wt 138.0 lb

## 2017-01-12 DIAGNOSIS — I10 Essential (primary) hypertension: Secondary | ICD-10-CM

## 2017-01-12 DIAGNOSIS — E785 Hyperlipidemia, unspecified: Secondary | ICD-10-CM

## 2017-01-12 DIAGNOSIS — Z01419 Encounter for gynecological examination (general) (routine) without abnormal findings: Secondary | ICD-10-CM

## 2017-01-12 NOTE — Assessment & Plan Note (Signed)
Reviewed preventive care protocols, scheduled due services, and updated immunizations Discussed nutrition, exercise, diet, and healthy lifestyle.  Orders Placed This Encounter  Procedures  . CBC with Differential/Platelet  . Comprehensive metabolic panel  . Lipid panel  . TSH  . Hemoglobin A1c  . Nicotine/cotinine metabolites

## 2017-01-12 NOTE — Assessment & Plan Note (Signed)
Continue current dose of pravachol. Check labs today. 

## 2017-01-12 NOTE — Progress Notes (Signed)
Subjective:    Patient ID: Krystal Walker, female    DOB: 13-Jul-1955, 61 y.o.   MRN: [id number]  HPI  61 yo pleasant female here for CPX.   S/p hysterectomy.  Not sexually active.  Mammogram 12/15/16 Colonoscopy 08/11/16   HLD- Taking pravachol 10 mg daily.  Working on diet as well.  Lab Results  Component Value Date   CHOL 189 07/04/2013   HDL 74 07/04/2013   LDLCALC 102 (H) 07/04/2013   TRIG 65 07/04/2013    Lab Results  Component Value Date   ALT 43 11/20/2016   AST 61 (H) 11/20/2016   ALKPHOS 91 11/20/2016   BILITOT 0.9 11/20/2016       Patient Active Problem List   Diagnosis Date Noted  . Well woman exam 01/12/2017  . Acute GI bleeding   . Diverticulosis of large intestine without diverticulitis   . Melena 08/09/2016  . Chronic tension-type headache, not intractable 09/03/2015  . Frequent headaches 08/13/2015  . Work-related stress 08/13/2015  . Fibrocystic disease of breast 09/07/2012  . Raynaud phenomenon 04/12/2012  . IBS (irritable bowel syndrome) 04/12/2012  . Essential hypertension   . Hyperlipidemia   . Depression   . GERD (gastroesophageal reflux disease)    Past Medical History:  Diagnosis Date  . Bowel trouble 1997  . Breast screening, unspecified   . Depression   . GERD (gastroesophageal reflux disease)   . GI bleed   . Hyperlipidemia   . Hypertension   . IBS (irritable bowel syndrome)   . Mammographic microcalcification   . Osteoporosis   . Personal history of tobacco use, presenting hazards to health   . Radiation    for adenoid cystic carcinoma  . Raynaud disease   . Raynaud phenomenon 1982  . 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 Special screening for malignant neoplasms, colon    Past Surgical History:  Procedure Laterality Date  . ABDOMINAL HYSTERECTOMY  2004   partial  . BREAST BIOPSY Left 02-23-12   w/clip - benign  . COLONOSCOPY  2010  . COLONOSCOPY WITH PROPOFOL N/A 08/11/2016   Procedure: COLONOSCOPY WITH PROPOFOL;  Surgeon: Jonathon Bellows, MD;  Location: [facility] ENDOSCOPY;  Service: Endoscopy;  Laterality: N/A;  . ESOPHAGOGASTRODUODENOSCOPY    . ESOPHAGOGASTRODUODENOSCOPY (EGD) WITH PROPOFOL N/A 08/10/2016   Procedure: ESOPHAGOGASTRODUODENOSCOPY (EGD) WITH PROPOFOL;  Surgeon: Wilford Corner, MD;  Location: [facility] ENDOSCOPY;  Service: Endoscopy;  Laterality: N/A;  . PAROTIDECTOMY Right    adenoid cystic carcinoma  . SALIVARY GLAND SURGERY  1999   Social History  Substance Use Topics  . Smoking status: Former Smoker    Packs/day: 1.00    Years: 15.00    Types: Cigarettes  . Smokeless tobacco: Never Used     Comment: quit 1986  . Alcohol use No   Family History  Problem Relation Age of Onset  . Arrhythmia Mother        pacemaker  . Fainting Mother   . Hypertension Mother   . Hyperlipidemia Mother   . Breast cancer Neg Hx    Allergies  Allergen Reactions  . Latex Hives and Itching  . Morphine Nausea And Vomiting  . Morphine And Related Nausea And Vomiting  . Nickel Other (See Comments)   Current Outpatient Prescriptions on File Prior to Visit  Medication Sig Dispense Refill  . aspirin EC 81 MG tablet Take 81 mg by mouth daily.    . butalbital-acetaminophen-caffeine (FIORICET) 50-325-40 MG tablet  Take 1-2 tablets by mouth every 6 (six) hours as needed for headache. 60 tablet 0  . Calcium Carbonate-Vitamin D (CALCIUM 600+D) 600-400 MG-UNIT tablet Take 1 tablet by mouth daily.    . hyoscyamine (LEVSIN, ANASPAZ) 0.125 MG tablet Take by mouth.    . meloxicam (MOBIC) 15 MG tablet TAKE 1 TABLET BY MOUTH  DAILY 90 tablet 0  . Multiple Vitamin (MULTIVITAMIN WITH MINERALS) TABS tablet Take 1 tablet by mouth daily.    Marland Kitchen omeprazole (PRILOSEC) 40 MG capsule TAKE 1 CAPSULE BY MOUTH  EVERY DAY 90 capsule 1  . pravastatin (PRAVACHOL) 10 MG tablet TAKE 1 TABLET BY MOUTH  DAILY 90 tablet 3  . Probiotic Product (ALIGN) 4 MG CAPS Take 4 mg by mouth daily.    . propranolol  (INDERAL) 20 MG tablet Take 20 mg by mouth 2 (two) times daily.    Marland Kitchen SIMETHICONE PO Take by mouth.    . traMADol (ULTRAM) 50 MG tablet TAKE 1 TABLET BY MOUTH EVERY 6 HOURS AS NEEDED FOR MODERATE PAIN 30 tablet 0   No current facility-administered medications on file prior to visit.    The PMH, PSH, Social History, Family History, Medications, and allergies have been reviewed in [facility], and have been updated if relevant.   Review of Systems  Constitutional: Negative.   HENT: Negative.   Respiratory: Negative.   Cardiovascular: Negative.   Gastrointestinal: Negative.   Endocrine: Negative.   Genitourinary: Negative.   Musculoskeletal: Negative.   Allergic/Immunologic: Negative.   Neurological: Negative.   Hematological: Negative.   Psychiatric/Behavioral: Negative.   All other systems reviewed and are negative.       Objective:   Physical Exam BP 110/80   Pulse 80   Ht 5' 4.75" (1.645 m)   Wt 138 lb (62.6 kg)   SpO2 99%   BMI 23.14 kg/m   General:  Well-developed,well-nourished,in no acute distress; alert,appropriate and cooperative throughout examination Head:  normocephalic and atraumatic.   Eyes:  vision grossly intact, pupils equal, pupils round, and pupils reactive to light.   Ears:  R ear normal and L ear normal.   Nose:  no external deformity.   Mouth:  good dentition.   Lungs:  Normal respiratory effort, chest expands symmetrically. Lungs are clear to auscultation, no crackles or wheezes. Heart:  Normal rate and regular rhythm. S1 and S2 normal without gallop, murmur, click, rub or other extra sounds. Extremities:  No clubbing, cyanosis, edema, or deformity noted with normal full range of motion of all joints.   Neurologic:  alert & oriented X3 and gait normal.   Skin:  Intact without suspicious lesions or rashes Psych:  Cognition and judgment appear intact.      Assessment & Plan:

## 2017-01-12 NOTE — Assessment & Plan Note (Signed)
Well controlled.  No changes made. 

## 2017-01-14 LAB — CBC WITH DIFFERENTIAL/PLATELET
BASOS: 1 %
Basophils Absolute: 0 10*3/uL (ref 0.0–0.2)
EOS (ABSOLUTE): 0.1 10*3/uL (ref 0.0–0.4)
EOS: 3 %
HEMATOCRIT: 37.2 % (ref 34.0–46.6)
Hemoglobin: 12.3 g/dL (ref 11.1–15.9)
IMMATURE GRANULOCYTES: 0 %
Immature Grans (Abs): 0 10*3/uL (ref 0.0–0.1)
Lymphocytes Absolute: 1.3 10*3/uL (ref 0.7–3.1)
Lymphs: 40 %
MCH: 29.9 pg (ref 26.6–33.0)
MCHC: 33.1 g/dL (ref 31.5–35.7)
MCV: 91 fL (ref 79–97)
MONOS ABS: 0.3 10*3/uL (ref 0.1–0.9)
Monocytes: 10 %
NEUTROS ABS: 1.5 10*3/uL (ref 1.4–7.0)
Neutrophils: 46 %
Platelets: 172 10*3/uL (ref 150–379)
RBC: 4.11 x10E6/uL (ref 3.77–5.28)
RDW: 15.3 % (ref 12.3–15.4)
WBC: 3.3 10*3/uL — ABNORMAL LOW (ref 3.4–10.8)

## 2017-01-14 LAB — COMPREHENSIVE METABOLIC PANEL
A/G RATIO: 2.2 (ref 1.2–2.2)
ALT: 23 IU/L (ref 0–32)
AST: 34 IU/L (ref 0–40)
Albumin: 4.6 g/dL (ref 3.6–4.8)
Alkaline Phosphatase: 65 IU/L (ref 39–117)
BUN/Creatinine Ratio: 16 (ref 12–28)
BUN: 15 mg/dL (ref 8–27)
Bilirubin Total: 0.4 mg/dL (ref 0.0–1.2)
CALCIUM: 9.9 mg/dL (ref 8.7–10.3)
CO2: 26 mmol/L (ref 20–29)
CREATININE: 0.95 mg/dL (ref 0.57–1.00)
Chloride: 101 mmol/L (ref 96–106)
GFR, EST AFRICAN AMERICAN: 75 mL/min/{1.73_m2} (ref >59)
GFR, EST NON AFRICAN AMERICAN: 65 mL/min/{1.73_m2} (ref >59)
GLOBULIN, TOTAL: 2.1 g/dL (ref 1.5–4.5)
Glucose: 94 mg/dL (ref 65–99)
Potassium: 4.6 mmol/L (ref 3.5–5.2)
Sodium: 141 mmol/L (ref 134–144)
TOTAL PROTEIN: 6.7 g/dL (ref 6.0–8.5)

## 2017-01-14 LAB — LIPID PANEL
CHOL/HDL RATIO: 3.1 ratio (ref 0.0–4.4)
Cholesterol, Total: 185 mg/dL (ref 100–199)
HDL: 59 mg/dL (ref >39)
LDL CALC: 101 mg/dL — AB (ref 0–99)
Triglycerides: 126 mg/dL (ref 0–149)
VLDL CHOLESTEROL CAL: 25 mg/dL (ref 5–40)

## 2017-01-14 LAB — NICOTINE/COTININE METABOLITES
Cotinine: NOT DETECTED ng/mL
Nicotine: NOT DETECTED ng/mL

## 2017-01-14 LAB — HEMOGLOBIN A1C
Est. average glucose Bld gHb Est-mCnc: 114 mg/dL
Hgb A1c MFr Bld: 5.6 % (ref 4.8–5.6)

## 2017-01-14 LAB — TSH: TSH: 2.67 u[IU]/mL (ref 0.450–4.500)

## 2017-01-19 ENCOUNTER — Telehealth: Payer: Self-pay | Admitting: Family Medicine

## 2017-01-19 NOTE — Telephone Encounter (Signed)
Patient brought in a Wellness Screening form to be filled out by the Provider.  The form was placed in the Provider's prescription incoming box.

## 2017-01-20 NOTE — Telephone Encounter (Signed)
I do not see this form in my box.

## 2017-01-22 ENCOUNTER — Encounter: Payer: Self-pay | Admitting: Family Medicine

## 2017-01-27 ENCOUNTER — Encounter: Payer: Self-pay | Admitting: Family Medicine

## 2017-01-28 NOTE — Telephone Encounter (Signed)
Does anyone know if this has been done?

## 2017-01-30 NOTE — Telephone Encounter (Signed)
Pt came in to pick up forms

## 2017-02-05 ENCOUNTER — Other Ambulatory Visit: Payer: Self-pay | Admitting: Family Medicine

## 2017-02-23 ENCOUNTER — Other Ambulatory Visit: Payer: Self-pay | Admitting: Family Medicine

## 2017-03-04 NOTE — Telephone Encounter (Signed)
Error

## 2017-04-13 ENCOUNTER — Ambulatory Visit: Payer: 59 | Admitting: Gastroenterology

## 2017-04-13 ENCOUNTER — Encounter: Payer: Self-pay | Admitting: Gastroenterology

## 2017-04-13 VITALS — BP 158/98 | HR 67 | Temp 98.2°F | Ht 64.75 in | Wt 138.2 lb

## 2017-04-13 DIAGNOSIS — R140 Abdominal distension (gaseous): Secondary | ICD-10-CM

## 2017-04-13 DIAGNOSIS — K581 Irritable bowel syndrome with constipation: Secondary | ICD-10-CM

## 2017-04-13 NOTE — Progress Notes (Signed)
Jonathon Bellows MD, MRCP(U.K) [address]  [place]  [place], [place] [address]  Main: [phone]  Fax: [phone]   Primary Care Physician: Lucille Passy, MD  Primary Gastroenterologist:  Dr. Jonathon Bellows   Chief Complaint  Patient presents with  . Diarrhea    HPI: Krystal Walker is a 61 y.o. female    Summary of history :  Krystal Walker a 61 y.o.femaleis here today for follow up . She was admitted on 08/10/16 with melena . Had been on meloxicam as well as an Asprin. EGD was performed and was normal . I subsequently performed a colonoscopy which was a poor prep but did not see any blood or large polyps.  She carries a diagnosis of IBS for >20 years and used to follow with Dr Tiffany Kocher. On her initial follow up it appeared her dietary content of fiber was very low and I felt was playing a large part with her symptoms and constipation . Symptoms improved with increase in dietary fiber.    Interval history 10/2016 -04/2017   Overall says that she is doing well. Uses benefiber, daily miralax causes diarrhea and uses PRN. She says gas has been a big issue, Consumes [place] for breakfast , chicken and rice soup for lunch . She uses stevia. , other artificial sweeteners in her diet  She was subsequently evaluated by Dr Lucky Cowboy for her hemorrhoids and planned for hemorrhoidectomy but was cancelled, presently using changes in life style and hemorroids are stable for now     Current Outpatient Medications  Medication Sig Dispense Refill  . AF-LOPERAMIDE HCL PO Take by mouth.    . AFLURIA QUADRIVALENT 0.5 ML injection ADM 0.5ML IM UTD  0  . BESIVANCE 0.6 % SUSP     . butalbital-acetaminophen-caffeine (FIORICET) 50-325-40 MG tablet Take 1-2 tablets by mouth every 6 (six) hours as needed for headache. 60 tablet 0  . Calcium Carbonate-Vit D-Min (CALCIUM 600+D PLUS MINERALS) 600-400 MG-UNIT TABS Take by mouth.    . Calcium Carbonate-Vitamin D (CALCIUM 600+D) 600-400 MG-UNIT  tablet Take 1 tablet by mouth daily.    . DUREZOL 0.05 % EMUL     . hydrochlorothiazide (HYDRODIURIL) 12.5 MG tablet Take by mouth.    . hyoscyamine (LEVSIN, ANASPAZ) 0.125 MG tablet Take by mouth.    . meloxicam (MOBIC) 15 MG tablet TAKE 1 TABLET BY MOUTH  DAILY 90 tablet 1  . Multiple Vitamin (MULTIVITAMIN WITH MINERALS) TABS tablet Take 1 tablet by mouth daily.    Marland Kitchen omeprazole (PRILOSEC) 40 MG capsule TAKE 1 CAPSULE BY MOUTH  EVERY DAY 90 capsule 1  . Probiotic Product (ALIGN) 4 MG CAPS Take 4 mg by mouth daily.    . propranolol (INDERAL) 20 MG tablet Take 20 mg by mouth 2 (two) times daily.    Marland Kitchen SIMETHICONE PO Take by mouth.    . Vitamins/Minerals TABS Take by mouth.    Marland Kitchen aspirin EC 81 MG tablet Take 81 mg by mouth daily.    . pravastatin (PRAVACHOL) 10 MG tablet TAKE 1 TABLET BY MOUTH  DAILY (Patient not taking: Reported on 04/13/2017) 90 tablet 3  . traMADol (ULTRAM) 50 MG tablet TAKE 1 TABLET BY MOUTH EVERY 6 HOURS AS NEEDED FOR MODERATE PAIN (Patient not taking: Reported on 04/13/2017) 30 tablet 0   No current facility-administered medications for this visit.     Allergies as of 04/13/2017 - Review Complete 04/13/2017  Allergen Reaction Noted  . Latex Hives and  Itching 12/08/2011  . Morphine Nausea And Vomiting 11/06/2011  . Morphine and related Nausea And Vomiting 11/06/2011  . Nickel Other (See Comments) 09/03/2015    ROS:  General: Negative for anorexia, weight loss, fever, chills, fatigue, weakness. ENT: Negative for hoarseness, difficulty swallowing , nasal congestion. CV: Negative for chest pain, angina, palpitations, dyspnea on exertion, peripheral edema.  Respiratory: Negative for dyspnea at rest, dyspnea on exertion, cough, sputum, wheezing.  GI: See history of present illness. GU:  Negative for dysuria, hematuria, urinary incontinence, urinary frequency, nocturnal urination.  Endo: Negative for unusual weight change.    Physical Examination:   BP (!) 158/98    Pulse 67   Temp 98.2 F (36.8 C) (Oral)   Ht 5' 4.75" (1.645 m)   Wt 138 lb 3.2 oz (62.7 kg)   BMI 23.18 kg/m   General: Well-nourished, well-developed in no acute distress.  Eyes: No icterus. Conjunctivae pink. Mouth: Oropharyngeal mucosa moist and pink , no lesions erythema or exudate. Lungs: Clear to auscultation bilaterally. Non-labored. Heart: Regular rate and rhythm, no murmurs rubs or gallops.  Abdomen: Bowel sounds are normal, nontender, nondistended, no hepatosplenomegaly or masses, no abdominal bruits or hernia , no rebound or guarding.   Extremities: No lower extremity edema. No clubbing or deformities. Neuro: Alert and oriented x 3.  Grossly intact. Skin: Warm and dry, no jaundice.   Psych: Alert and cooperative, normal mood and affect.   Imaging Studies: No results found.  Assessment and Plan:   Krystal Walker is a 62 y.o. y/o female  here to follow up for IBS-C . Presently her main issue is bloating , gas. Likely from her diet which is rich in artificial sugars.   Plan   1. LOW FODMAP diet 2. Stop all artificial sugars , cut down benefiber 3. If no better at next visit will consider Rx for SIBO  Dr Jonathon Bellows  MD,MRCP [facility]) Follow up in 6-8 weeks

## 2017-04-20 ENCOUNTER — Encounter: Payer: Self-pay | Admitting: Internal Medicine

## 2017-04-20 ENCOUNTER — Ambulatory Visit: Payer: 59 | Admitting: Internal Medicine

## 2017-04-20 VITALS — BP 128/82 | HR 70 | Temp 97.8°F | Wt 137.0 lb

## 2017-04-20 DIAGNOSIS — I7300 Raynaud's syndrome without gangrene: Secondary | ICD-10-CM

## 2017-04-20 DIAGNOSIS — K219 Gastro-esophageal reflux disease without esophagitis: Secondary | ICD-10-CM

## 2017-04-20 DIAGNOSIS — K582 Mixed irritable bowel syndrome: Secondary | ICD-10-CM

## 2017-04-20 DIAGNOSIS — M816 Localized osteoporosis [Lequesne]: Secondary | ICD-10-CM | POA: Diagnosis not present

## 2017-04-20 DIAGNOSIS — F325 Major depressive disorder, single episode, in full remission: Secondary | ICD-10-CM

## 2017-04-20 DIAGNOSIS — M810 Age-related osteoporosis without current pathological fracture: Secondary | ICD-10-CM | POA: Insufficient documentation

## 2017-04-20 DIAGNOSIS — E7800 Pure hypercholesterolemia, unspecified: Secondary | ICD-10-CM

## 2017-04-20 DIAGNOSIS — R51 Headache: Secondary | ICD-10-CM

## 2017-04-20 DIAGNOSIS — Z85818 Personal history of malignant neoplasm of other sites of lip, oral cavity, and pharynx: Secondary | ICD-10-CM | POA: Insufficient documentation

## 2017-04-20 DIAGNOSIS — I10 Essential (primary) hypertension: Secondary | ICD-10-CM | POA: Diagnosis not present

## 2017-04-20 DIAGNOSIS — R519 Headache, unspecified: Secondary | ICD-10-CM

## 2017-04-20 NOTE — Assessment & Plan Note (Signed)
Advised her to continue Calcium and Vit D OTC Encouraged her to get 30 minutes of weight bearing activity daily

## 2017-04-20 NOTE — Assessment & Plan Note (Signed)
Encouraged her to consume a low fat diet CMET and Lipid Profile today If LDL > 130, consider trying a different statin (had myalgias with Pravastatin)

## 2017-04-20 NOTE — Assessment & Plan Note (Signed)
Currently not an issue Will monitor 

## 2017-04-20 NOTE — Progress Notes (Signed)
HPI  Pt presents to the clinic today to establish care and for management of the conditions listed below. She is transferring care from Dr. Deborra Medina.  Depression: Not currently an issue. She is not taking any antidepressants at this time.  GERD: Triggered by her other medications. She takes Omeprazole daily with good relief.  HLD: She quit taking her Pravastatin secondary muscle cramps in her legs. She reports once she stopped the medication, the cramping improved. She consumes a low fat diet.   HTN: Her BP today is 128/82. She is taking Propanolol once daily as well as HCTZ. She reports her BP's have been variable over the last few weeks. She reports she used to take the Propanolol 2 x day, and is not sure when it was changed to 1 x day. She has called neurology to ask, but reports she has not had a response from Dr. Melrose Nakayama.  IBS: Mainly contipation and diarrhea. She is currently on a FODMAP diet. She takes, Hyoscyamine, Align, Mylanta, Benefiber and Mirilax as needed with good relief of symptoms.   Raynaud's: She reports she is currently not having any issues with this. She is not taking any calcium channel blockers.  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 S/p radiation. No chemo. She no longer follows with ENT or oncologist.  Osteoporosis: Her last bone density was in 2011. She is taking Calcium and Vit D OTC.   Headaches: She feels like these are occurring secondary to HTN. She takes Fioricet 3 x week with good relief of symptoms. She is taking Propanolo 1 x day, and confused as to why it was changed to 1 x day. She follows with Dr. Melrose Nakayama, note from 10/2016 reviewed.  Flu: 02/2017 Tetanus: 06/2012 Shingles Vaccine: 04/2016 Pap Smear: 10/2009, partial hysterectomy Mammogram: 12/2016 Colon Screening: 08/2016 Vision Screening: every 3 months Dentist: annually  Past Medical History:  Diagnosis Date  . Bowel trouble 1997  . Breast screening,  unspecified   . Depression   . GERD (gastroesophageal reflux disease)   . GI bleed   . Hyperlipidemia   . Hypertension   . IBS (irritable bowel syndrome)   . Mammographic microcalcification   . Osteoporosis   . Personal history of tobacco use, presenting hazards to health   . Radiation    for adenoid cystic carcinoma  . Raynaud disease   . Raynaud phenomenon 1982  . Salivary gland cancer ([place]) 1999   s/p resection and radiation  . Special screening for malignant neoplasms, colon     Current Outpatient Medications  Medication Sig Dispense Refill  . AF-LOPERAMIDE HCL PO Take by mouth.    . AFLURIA QUADRIVALENT 0.5 ML injection ADM 0.5ML IM UTD  0  . aspirin EC 81 MG tablet Take 81 mg by mouth daily.    Marland Kitchen BESIVANCE 0.6 % SUSP     . butalbital-acetaminophen-caffeine (FIORICET) 50-325-40 MG tablet Take 1-2 tablets by mouth every 6 (six) hours as needed for headache. 60 tablet 0  . Calcium Carbonate-Vit D-Min (CALCIUM 600+D PLUS MINERALS) 600-400 MG-UNIT TABS Take by mouth.    . Calcium Carbonate-Vitamin D (CALCIUM 600+D) 600-400 MG-UNIT tablet Take 1 tablet by mouth daily.    . DUREZOL 0.05 % EMUL     . hydrochlorothiazide (HYDRODIURIL) 12.5 MG tablet Take by mouth.    . hyoscyamine (LEVSIN, ANASPAZ) 0.125 MG tablet Take by mouth.    . meloxicam (MOBIC) 15 MG tablet TAKE 1 TABLET BY MOUTH  DAILY 90  tablet 1  . Multiple Vitamin (MULTIVITAMIN WITH MINERALS) TABS tablet Take 1 tablet by mouth daily.    Marland Kitchen omeprazole (PRILOSEC) 40 MG capsule TAKE 1 CAPSULE BY MOUTH  EVERY DAY 90 capsule 1  . pravastatin (PRAVACHOL) 10 MG tablet TAKE 1 TABLET BY MOUTH  DAILY 90 tablet 3  . Probiotic Product (ALIGN) 4 MG CAPS Take 4 mg by mouth daily.    . propranolol (INDERAL) 20 MG tablet Take 20 mg by mouth 2 (two) times daily.    Marland Kitchen SIMETHICONE PO Take by mouth.    . traMADol (ULTRAM) 50 MG tablet TAKE 1 TABLET BY MOUTH EVERY 6 HOURS AS NEEDED FOR MODERATE PAIN 30 tablet 0  . Vitamins/Minerals TABS Take  by mouth.     No current facility-administered medications for this visit.     Allergies  Allergen Reactions  . Latex Hives and Itching  . Morphine Nausea And Vomiting  . Morphine And Related Nausea And Vomiting  . Nickel Other (See Comments)    Family History  Problem Relation Age of Onset  . Arrhythmia Mother        pacemaker  . Fainting Mother   . Hypertension Mother   . Hyperlipidemia Mother   . Breast cancer Neg Hx     Social History   Socioeconomic History  . Marital status: Single    Spouse name: Not on file  . Number of children: Not on file  . Years of education: Not on file  . Highest education level: Not on file  Social Needs  . Financial resource strain: Not on file  . Food insecurity - worry: Not on file  . Food insecurity - inability: Not on file  . Transportation needs - medical: Not on file  . Transportation needs - non-medical: Not on file  Occupational History  . Not on file  Tobacco Use  . Smoking status: Former Smoker    Packs/day: 1.00    Years: 15.00    Pack years: 15.00    Types: Cigarettes  . Smokeless tobacco: Never Used  . Tobacco comment: quit 1986  Substance and Sexual Activity  . Alcohol use: No    Alcohol/week: 0.0 oz  . Drug use: No  . Sexual activity: Not Currently  Other Topics Concern  . Not on file  Social History Narrative  . Not on file    ROS:  Constitutional: Pt reports intermittent headaches. Denies fever, malaise, fatigue, or abrupt weight changes.  HEENT: Denies eye pain, eye redness, ear pain, ringing in the ears, wax buildup, runny nose, nasal congestion, bloody nose, or sore throat. Respiratory: Denies difficulty breathing, shortness of breath, cough or sputum production.   Cardiovascular: Denies chest pain, chest tightness, palpitations or swelling in the hands or feet.  Gastrointestinal: Pt reports alternating constipation and diarrhea. Denies abdominal pain, bloating, or blood in the stool.  GU: Denies  frequency, urgency, pain with urination, blood in urine, odor or discharge. Musculoskeletal: Denies decrease in range of motion, difficulty with gait, muscle pain or joint pain and swelling.  Skin: Denies redness, rashes, lesions or ulcercations.  Neurological: Denies dizziness, difficulty with memory, difficulty with speech or problems with balance and coordination.  Psych: Denies anxiety, depression, SI/HI.  No other specific complaints in a complete review of systems (except as listed in HPI above).  PE:  BP 128/82   Pulse 70   Temp 97.8 F (36.6 C) (Oral)   Wt 137 lb (62.1 kg)   SpO2 98%  BMI 22.97 kg/m  Wt Readings from Last 3 Encounters:  04/20/17 137 lb (62.1 kg)  04/13/17 138 lb 3.2 oz (62.7 kg)  01/12/17 138 lb (62.6 kg)    General: Appears her stated age, well developed, well nourished in NAD. Cardiovascular: Normal rate and rhythm. S1,S2 noted.  No murmur, rubs or gallops noted. No JVD or BLE edema. No carotid bruits noted. Pulmonary/Chest: Normal effort and positive vesicular breath sounds. No respiratory distress. No wheezes, rales or ronchi noted.  Abdomen: Soft and nontender. Normal bowel sounds. Neurological: Alert and oriented.  Psychiatric: Mood and affect normal. Behavior is normal. Judgment and thought content normal.     BMET    Component Value Date/Time   NA 141 01/12/2017 0904   K 4.6 01/12/2017 0904   CL 101 01/12/2017 0904   CO2 26 01/12/2017 0904   GLUCOSE 94 01/12/2017 0904   GLUCOSE 111 (H) 11/20/2016 1046   BUN 15 01/12/2017 0904   CREATININE 0.95 01/12/2017 0904   CALCIUM 9.9 01/12/2017 0904   GFRNONAA 65 01/12/2017 0904   GFRAA 75 01/12/2017 0904    Lipid Panel     Component Value Date/Time   CHOL 185 01/12/2017 0904   TRIG 126 01/12/2017 0904   HDL 59 01/12/2017 0904   CHOLHDL 3.1 01/12/2017 0904   LDLCALC 101 (H) 01/12/2017 0904    CBC    Component Value Date/Time   WBC 3.3 (L) 01/12/2017 0904   WBC 8.4 11/20/2016 1046    RBC 4.11 01/12/2017 0904   RBC 4.45 11/20/2016 1046   HGB 12.3 01/12/2017 0904   HCT 37.2 01/12/2017 0904   PLT 172 01/12/2017 0904   MCV 91 01/12/2017 0904   MCH 29.9 01/12/2017 0904   MCH 30.7 11/20/2016 1046   MCHC 33.1 01/12/2017 0904   MCHC 35.0 11/20/2016 1046   RDW 15.3 01/12/2017 0904   LYMPHSABS 1.3 01/12/2017 0904   MONOABS 0.7 11/20/2016 1046   EOSABS 0.1 01/12/2017 0904   BASOSABS 0.0 01/12/2017 0904    Hgb A1C Lab Results  Component Value Date   HGBA1C 5.6 01/12/2017     Assessment and Plan:

## 2017-04-20 NOTE — Assessment & Plan Note (Signed)
She feels like this is related to BP, but BP is controlled on meds Advised her to try to identify any other possible triggers and avoid them Continue Propanolol and Fioricet as prescribed Will monitor

## 2017-04-20 NOTE — Patient Instructions (Signed)

## 2017-04-20 NOTE — Assessment & Plan Note (Signed)
Controlled on HCTZ and Propanolol Will monitor yearly CMET

## 2017-04-20 NOTE — Assessment & Plan Note (Signed)
In remission No complaints at this time Will monitor

## 2017-04-20 NOTE — Assessment & Plan Note (Signed)
She will continue current medications She will continue FODMAP diet Continue to follow with GI

## 2017-04-20 NOTE — Assessment & Plan Note (Signed)
She will continue Omeprazole for now Will monitor

## 2017-04-21 LAB — LIPID PANEL
CHOLESTEROL TOTAL: 245 mg/dL — AB (ref 100–199)
Chol/HDL Ratio: 4.1 ratio (ref 0.0–4.4)
HDL: 60 mg/dL (ref >39)
LDL CALC: 153 mg/dL — AB (ref 0–99)
TRIGLYCERIDES: 162 mg/dL — AB (ref 0–149)
VLDL CHOLESTEROL CAL: 32 mg/dL (ref 5–40)

## 2017-05-05 ENCOUNTER — Other Ambulatory Visit: Payer: Self-pay | Admitting: Family Medicine

## 2017-05-26 ENCOUNTER — Encounter (INDEPENDENT_AMBULATORY_CARE_PROVIDER_SITE_OTHER): Payer: 59 | Admitting: Ophthalmology

## 2017-05-26 DIAGNOSIS — I10 Essential (primary) hypertension: Secondary | ICD-10-CM

## 2017-05-26 DIAGNOSIS — H43813 Vitreous degeneration, bilateral: Secondary | ICD-10-CM

## 2017-05-26 DIAGNOSIS — H59031 Cystoid macular edema following cataract surgery, right eye: Secondary | ICD-10-CM | POA: Diagnosis not present

## 2017-05-26 DIAGNOSIS — H338 Other retinal detachments: Secondary | ICD-10-CM | POA: Diagnosis not present

## 2017-05-26 DIAGNOSIS — H35033 Hypertensive retinopathy, bilateral: Secondary | ICD-10-CM

## 2017-07-13 ENCOUNTER — Encounter (INDEPENDENT_AMBULATORY_CARE_PROVIDER_SITE_OTHER): Payer: 59 | Admitting: Ophthalmology

## 2017-07-14 ENCOUNTER — Other Ambulatory Visit: Payer: Self-pay | Admitting: Family Medicine

## 2017-07-15 NOTE — Telephone Encounter (Signed)
We did not discuss Meloxicam at her establish care visit. I am fine with refilling it, I just want to know what she is taking it for?

## 2017-07-20 ENCOUNTER — Encounter (INDEPENDENT_AMBULATORY_CARE_PROVIDER_SITE_OTHER): Payer: Managed Care, Other (non HMO) | Admitting: Ophthalmology

## 2017-07-20 DIAGNOSIS — H59031 Cystoid macular edema following cataract surgery, right eye: Secondary | ICD-10-CM | POA: Diagnosis not present

## 2017-07-20 DIAGNOSIS — H338 Other retinal detachments: Secondary | ICD-10-CM

## 2017-07-20 DIAGNOSIS — I10 Essential (primary) hypertension: Secondary | ICD-10-CM

## 2017-07-20 DIAGNOSIS — H43813 Vitreous degeneration, bilateral: Secondary | ICD-10-CM | POA: Diagnosis not present

## 2017-07-20 DIAGNOSIS — H35033 Hypertensive retinopathy, bilateral: Secondary | ICD-10-CM | POA: Diagnosis not present

## 2017-07-20 DIAGNOSIS — H2513 Age-related nuclear cataract, bilateral: Secondary | ICD-10-CM

## 2017-07-27 ENCOUNTER — Other Ambulatory Visit (INDEPENDENT_AMBULATORY_CARE_PROVIDER_SITE_OTHER): Payer: Managed Care, Other (non HMO)

## 2017-07-27 ENCOUNTER — Other Ambulatory Visit: Payer: Self-pay | Admitting: Internal Medicine

## 2017-07-27 DIAGNOSIS — E7800 Pure hypercholesterolemia, unspecified: Secondary | ICD-10-CM

## 2017-07-27 DIAGNOSIS — E785 Hyperlipidemia, unspecified: Secondary | ICD-10-CM

## 2017-07-28 LAB — LIPID PANEL
CHOLESTEROL TOTAL: 238 mg/dL — AB (ref 100–199)
Chol/HDL Ratio: 3.8 ratio (ref 0.0–4.4)
HDL: 63 mg/dL (ref >39)
LDL CALC: 157 mg/dL — AB (ref 0–99)
TRIGLYCERIDES: 91 mg/dL (ref 0–149)
VLDL Cholesterol Cal: 18 mg/dL (ref 5–40)

## 2017-07-31 ENCOUNTER — Telehealth: Payer: Self-pay

## 2017-07-31 NOTE — Telephone Encounter (Signed)
Removed pravastatin as instructed from lab result note from Avie Echevaria NP.

## 2017-08-31 ENCOUNTER — Encounter (INDEPENDENT_AMBULATORY_CARE_PROVIDER_SITE_OTHER): Payer: Managed Care, Other (non HMO) | Admitting: Ophthalmology

## 2017-08-31 DIAGNOSIS — I10 Essential (primary) hypertension: Secondary | ICD-10-CM

## 2017-08-31 DIAGNOSIS — H59031 Cystoid macular edema following cataract surgery, right eye: Secondary | ICD-10-CM | POA: Diagnosis not present

## 2017-08-31 DIAGNOSIS — H43813 Vitreous degeneration, bilateral: Secondary | ICD-10-CM

## 2017-08-31 DIAGNOSIS — H35033 Hypertensive retinopathy, bilateral: Secondary | ICD-10-CM | POA: Diagnosis not present

## 2017-09-05 ENCOUNTER — Other Ambulatory Visit: Payer: Self-pay

## 2017-09-05 ENCOUNTER — Encounter: Payer: Self-pay | Admitting: Emergency Medicine

## 2017-09-05 ENCOUNTER — Emergency Department: Payer: Managed Care, Other (non HMO)

## 2017-09-05 DIAGNOSIS — R079 Chest pain, unspecified: Secondary | ICD-10-CM | POA: Diagnosis not present

## 2017-09-05 DIAGNOSIS — Z79899 Other long term (current) drug therapy: Secondary | ICD-10-CM | POA: Diagnosis not present

## 2017-09-05 DIAGNOSIS — Z87891 Personal history of nicotine dependence: Secondary | ICD-10-CM | POA: Insufficient documentation

## 2017-09-05 DIAGNOSIS — I10 Essential (primary) hypertension: Secondary | ICD-10-CM | POA: Diagnosis not present

## 2017-09-05 DIAGNOSIS — R51 Headache: Secondary | ICD-10-CM | POA: Diagnosis not present

## 2017-09-05 DIAGNOSIS — Z7982 Long term (current) use of aspirin: Secondary | ICD-10-CM | POA: Diagnosis not present

## 2017-09-05 LAB — BASIC METABOLIC PANEL
Anion gap: 10 (ref 5–15)
BUN: 17 mg/dL (ref 6–20)
CALCIUM: 9.9 mg/dL (ref 8.9–10.3)
CO2: 25 mmol/L (ref 22–32)
Chloride: 104 mmol/L (ref 101–111)
Creatinine, Ser: 0.78 mg/dL (ref 0.44–1.00)
GLUCOSE: 146 mg/dL — AB (ref 65–99)
POTASSIUM: 4.3 mmol/L (ref 3.5–5.1)
SODIUM: 139 mmol/L (ref 135–145)

## 2017-09-05 LAB — CBC
HCT: 43 % (ref 35.0–47.0)
HEMOGLOBIN: 14.6 g/dL (ref 12.0–16.0)
MCH: 32.1 pg (ref 26.0–34.0)
MCHC: 33.9 g/dL (ref 32.0–36.0)
MCV: 94.8 fL (ref 80.0–100.0)
Platelets: 187 10*3/uL (ref 150–440)
RBC: 4.53 MIL/uL (ref 3.80–5.20)
RDW: 12.7 % (ref 11.5–14.5)
WBC: 5.3 10*3/uL (ref 3.6–11.0)

## 2017-09-05 LAB — TROPONIN I

## 2017-09-05 NOTE — ED Triage Notes (Addendum)
Pt reports chest pain since Thursday; intermittent pain above left breast that is non-radiating; also has a headache to the left side of her head since she woke today; took medication and went to work; pt says headache never went away; tried to sleep after work but is feeling anxious; pt has 2-3 family members that have died from strokes and she "didn't want to stroke out"

## 2017-09-05 NOTE — ED Notes (Signed)
Patient observed sitting in waiting room with family, no acute distress noted.

## 2017-09-06 ENCOUNTER — Emergency Department
Admission: EM | Admit: 2017-09-06 | Discharge: 2017-09-06 | Disposition: A | Discharge status: Home / Self Care | Payer: Managed Care, Other (non HMO) | Attending: Emergency Medicine | Admitting: Emergency Medicine

## 2017-09-06 ENCOUNTER — Other Ambulatory Visit: Payer: Self-pay

## 2017-09-06 DIAGNOSIS — I10 Essential (primary) hypertension: Secondary | ICD-10-CM

## 2017-09-06 DIAGNOSIS — R51 Headache: Secondary | ICD-10-CM

## 2017-09-06 DIAGNOSIS — R079 Chest pain, unspecified: Secondary | ICD-10-CM

## 2017-09-06 DIAGNOSIS — R519 Headache, unspecified: Secondary | ICD-10-CM

## 2017-09-06 LAB — TROPONIN I: Troponin I: <0.03 ng/mL (ref <0.03)

## 2017-09-06 MED ORDER — BUTALBITAL-APAP-CAFFEINE 50-325-40 MG PO TABS
1.0000 | ORAL_TABLET | Freq: Once | ORAL | Status: AC
  Administered 2017-09-06: 1 via ORAL
  Filled 2017-09-06: qty 1

## 2017-09-06 NOTE — ED Provider Notes (Signed)
[facility] Emergency Department Provider Note  ____________________________________________   First MD Initiated Contact with Patient 09/06/17 0129     (approximate)  I have reviewed the triage vital signs and the nursing notes.   HISTORY  Chief Complaint Chest Pain and Headache   HPI Krystal Walker is a 62 y.o. female with a history of GERD as well as hypertension was presenting to the emergency department today with 3 days of left-sided chest pain which she describes as sharp and lasting several seconds at a time.  However, she is having repeated episodes and says that it feels like if she were to take a deep breath during 1 of the episodes would be very painful.  She denies any injury or heavy lifting recently.  Denies any nausea or vomiting.  Says that she also started having a frontal headache today which she describes a sharp earlier but now just a dull frontal headache which is moderate.  History of hemorrhagic stroke in the family and she is concerned about her elevated blood pressure which she measured this evening.  Says that she is compliant with her blood pressure medications.  Past Medical History:  Diagnosis Date  . Depression   . GERD (gastroesophageal reflux disease)   . GI bleed   . Hyperlipidemia   . Hypertension   . IBS (irritable bowel syndrome)   . Osteoporosis   . Raynaud disease   . Salivary gland cancer ([place]) 1999   s/p resection and radiation    Patient Active Problem List   Diagnosis Date Noted  . 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 Osteoporosis 04/20/2017  . Frequent headaches 08/13/2015  . Raynaud phenomenon 04/12/2012  . IBS (irritable bowel syndrome) 04/12/2012  . Essential hypertension   . Hyperlipidemia   . Depression   . GERD (gastroesophageal reflux disease)     Past Surgical History:  Procedure Laterality Date  . ABDOMINAL HYSTERECTOMY  2004   partial  . BREAST BIOPSY Left 02-23-12   w/clip -  benign  . COLONOSCOPY WITH PROPOFOL N/A 08/11/2016   Procedure: COLONOSCOPY WITH PROPOFOL;  Surgeon: Jonathon Bellows, MD;  Location: [facility] ENDOSCOPY;  Service: Endoscopy;  Laterality: N/A;  . ESOPHAGOGASTRODUODENOSCOPY (EGD) WITH PROPOFOL N/A 08/10/2016   Procedure: ESOPHAGOGASTRODUODENOSCOPY (EGD) WITH PROPOFOL;  Surgeon: Wilford Corner, MD;  Location: [facility] ENDOSCOPY;  Service: Endoscopy;  Laterality: N/A;  . PAROTIDECTOMY Right    adenoid cystic carcinoma  . SALIVARY GLAND SURGERY  1999    Prior to Admission medications   Medication Sig Start Date End Date Taking? Authorizing Provider  AF-LOPERAMIDE HCL PO Take by mouth.    [provider]  AFLURIA QUADRIVALENT 0.5 ML injection ADM 0.5ML IM UTD 03/02/17   [provider]  aspirin EC 81 MG tablet Take 81 mg by mouth daily.    [provider]  BESIVANCE 0.6 % SUSP  02/05/17   [provider]  butalbital-acetaminophen-caffeine (FIORICET) 50-325-40 MG tablet Take 1-2 tablets by mouth every 6 (six) hours as needed for headache. 12/29/16 12/29/17  Lucille Passy, MD  Calcium Carbonate-Vit D-Min (CALCIUM 600+D PLUS MINERALS) 600-400 MG-UNIT TABS Take by mouth.    [provider]  Calcium Carbonate-Vitamin D (CALCIUM 600+D) 600-400 MG-UNIT tablet Take 1 tablet by mouth daily.    [provider]  DUREZOL 0.05 % EMUL  02/05/17   [provider]  hydrochlorothiazide (HYDRODIURIL) 12.5 MG tablet Take by mouth. 10/06/16   [provider]  hyoscyamine (LEVSIN,  ANASPAZ) 0.125 MG tablet Take by mouth. 03/19/16   [provider]  meloxicam (MOBIC) 15 MG tablet TAKE 1 TABLET BY MOUTH  DAILY 07/16/17   Jearld Fenton, NP  Multiple Vitamin (MULTIVITAMIN WITH MINERALS) TABS tablet Take 1 tablet by mouth daily.    [provider]  omeprazole (PRILOSEC) 40 MG capsule TAKE 1 CAPSULE BY MOUTH  EVERY DAY 05/06/17   Jearld Fenton, NP  Probiotic Product (ALIGN) 4 MG CAPS Take 4 mg by mouth  daily.    [provider]  propranolol (INDERAL) 20 MG tablet Take 20 mg by mouth 2 (two) times daily. 05/14/16   [provider]  SIMETHICONE PO Take by mouth.    [provider]  Vitamins/Minerals TABS Take by mouth.    [provider]    Allergies Latex; Morphine; Morphine and related; and Nickel  Family History  Problem Relation Age of Onset  . Arrhythmia Mother        pacemaker  . Fainting Mother   . Hypertension Mother   . Hyperlipidemia Mother   . Breast cancer Neg Hx     Social History Social History   Tobacco Use  . Smoking status: Former Smoker    Packs/day: 1.00    Years: 15.00    Pack years: 15.00    Types: Cigarettes  . Smokeless tobacco: Never Used  . Tobacco comment: quit 1986  Substance Use Topics  . Alcohol use: No    Alcohol/week: 0.0 oz  . Drug use: No    Review of Systems  Constitutional: No fever/chills Eyes: No visual changes. ENT: No sore throat. Cardiovascular: As above respiratory: Denies shortness of breath. Gastrointestinal: No abdominal pain.  No nausea, no vomiting.  No diarrhea.  No constipation. Genitourinary: Negative for dysuria. Musculoskeletal: Negative for back pain. Skin: Negative for rash. Neurological: Negative for focal weakness or numbness.   ____________________________________________   PHYSICAL EXAM:  VITAL SIGNS: ED Triage Vitals [09/05/17 2126]  Enc Vitals Group     BP (!) 206/107     Pulse Rate 83     Resp 18     Temp 98.7 F (37.1 C)     Temp Source Oral     SpO2 100 %     Weight 136 lb (61.7 kg)     Height 5\' 4"  (1.626 m)     Head Circumference      Peak Flow      Pain Score 8     Pain Loc      Pain Edu?      Excl. in [place]?     Constitutional: Alert and oriented. Well appearing and in no acute distress. Eyes: Conjunctivae are normal.  Head: Atraumatic. Nose: No congestion/rhinnorhea. Mouth/Throat: Mucous membranes are moist.  Neck: No stridor.     Cardiovascular: Normal rate, regular rhythm. Grossly normal heart sounds.  Chest pain is not reproducible to palpation. Respiratory: Normal respiratory effort.  No retractions. Lungs CTAB. Gastrointestinal: Soft and nontender. No distention.  Musculoskeletal: No lower extremity tenderness nor edema.  No joint effusions. Neurologic:  Normal speech and language. No gross focal neurologic deficits are appreciated. Skin:  Skin is warm, dry and intact. No rash noted. Psychiatric: Mood and affect are normal. Speech and behavior are normal.  ____________________________________________   LABS (all labs ordered are listed, but only abnormal results are displayed)  Labs Reviewed  BASIC METABOLIC PANEL - Abnormal; Notable for the following components:      Result Value  Glucose, Bld 146 (*)    All other components within normal limits  CBC  TROPONIN I  TROPONIN I   ____________________________________________  EKG  ED ECG REPORT I, Doran Stabler, the attending physician, personally viewed and interpreted this ECG.   Date: 09/06/2017  EKG Time: 2125  Rate: 81  Rhythm: normal sinus rhythm  Axis: Normal  Intervals:none  ST&T Change: No ST segment elevation or depression.  No abnormal T wave inversions.  ____________________________________________  RADIOLOGY  No acute finding on the chest x-ray ____________________________________________   PROCEDURES  Procedure(s) performed:   Procedures  Critical Care performed:   ____________________________________________   INITIAL IMPRESSION / ASSESSMENT AND PLAN / ED COURSE  Pertinent labs & imaging results that were available during my care of the patient were reviewed by me and considered in my medical decision making (see chart for details).  Differential diagnosis includes, but is not limited to, intracranial hemorrhage, meningitis/encephalitis, previous head trauma, cavernous venous thrombosis, tension headache,  temporal arteritis, migraine or migraine equivalent, idiopathic intracranial hypertension, and non-specific headache. As part of my medical decision making, I reviewed the following data within the electronic MEDICAL RECORD NUMBER Notes from prior ED visits  ----------------------------------------- 4:00 AM on 09/06/2017 -----------------------------------------  Patient at this time is chest pain and headache free after taking Fioricet.  Blood pressure also improving at 156/105.  Very reassuring work-up with troponin x2 which are negative.  Very atypical for ACS given the fast fleeting chest pains.  Possible pleurisy.  However I do not suspect that the patient is having PE.  Her heart rate is in the 70s.  She has high blood pressure and her oxygen saturation is 100%.  She says that she has been having some "sneezing fits" recently.  Possible pleurisy from this.  I did recommend that she follow-up with her primary care doctor as well as her cardiologist and that she track her blood pressure as she is only had blood pressure readings over the past day that have been elevated.  She is understanding of this plan willing to comply but will be discharged at this time. ____________________________________________   FINAL CLINICAL IMPRESSION(S) / ED DIAGNOSES  Hypertension.  Headache.  Chest pain.    NEW MEDICATIONS STARTED DURING THIS VISIT:  New Prescriptions   No medications on file     Note:  This document was prepared using Dragon voice recognition software and may include unintentional dictation errors.     Orbie Pyo, MD 09/06/17 [phone]

## 2017-09-06 NOTE — ED Notes (Signed)
Reviewed with pt discharge instructions encouraged low sodium diet more water intake f/u with PCP

## 2017-09-06 NOTE — ED Notes (Signed)
Pt reports extensive medical hx, wants to be seen for HTN caused HA and chest pains for the last three days

## 2017-09-07 ENCOUNTER — Other Ambulatory Visit: Payer: Self-pay | Admitting: Family Medicine

## 2017-09-07 NOTE — Telephone Encounter (Signed)
RB-Plz see refill req/thx dmf 

## 2017-09-10 ENCOUNTER — Telehealth: Payer: Self-pay

## 2017-09-10 NOTE — Telephone Encounter (Signed)
Lmov for patient to call back  Pt was seen in ED on 09/05/17  Will call back at a later time

## 2017-09-11 NOTE — Telephone Encounter (Signed)
Lmov for patient to call back  Pt was seen in ED on 09/05/17  Will call back at a later time

## 2017-09-14 NOTE — Telephone Encounter (Signed)
Spoke with Patient .  She thinks chest pain was a muscle issue as it was helped with arthritis cream .  She realized she was carrying and lifting files .  Patient says she has not gotten this pain since.  She states she is treated by pcp for bp issues .  She doesn't want an appt at this time and will call if needing appt in the future.

## 2017-09-14 NOTE — Telephone Encounter (Signed)
lmov to schedule appt.  3 attempts to schedule fu appt .  Mailed Letter.

## 2017-09-28 ENCOUNTER — Telehealth: Payer: Self-pay | Admitting: *Deleted

## 2017-09-28 ENCOUNTER — Ambulatory Visit: Payer: Self-pay | Admitting: *Deleted

## 2017-09-28 NOTE — Telephone Encounter (Signed)
Pt reports lump on right side of neck, 2-3 inches below jaw. Size of "walnut." Denies tenderness, "feels heavy." H/O salivary gland cancer 20 years ago, same side of neck. Denies SOB, dysphagia; no other symptoms. Requesting appt for referral to oncologist but pt can not make appt until next week. Appt secured by agent prior to triage. Pt is planning on calling her oncologist in the meantime to discuss symptoms.   Reason for Disposition . [1] Small swelling or lump AND [2] unexplained AND [3] present > 1 week    H/O salivary gland cancer  Answer Assessment - Initial Assessment Questions 1. APPEARANCE of SWELLING: "What does it look like?" (e.g., lymph node, insect bite, mole)     "Visible lump" 2. SIZE: "How large is the swelling?" (inches, cm or compare to coins)     Helper 3. LOCATION: "Where is the swelling located?"     Right side of neck, 2-3 inches below jaw line. 4. ONSET: "When did the swelling start?"     Unsure, noted week ago 5. PAIN: "Is it painful?" If so, ask: "How much?"     "Feels heavy on that side of neck." 6. ITCH: "Does it itch?" If so, ask: "How much?"     No 7. CAUSE: "What do you think caused the swelling?"     H/O salivary gland cancer 20 years ago. 8. OTHER SYMPTOMS: "Do you have any other symptoms?" (e.g., fever)     no  Protocols used: SKIN LUMP OR LOCALIZED SWELLING-A-AH

## 2017-09-28 NOTE — Telephone Encounter (Signed)
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 She is asking if she can be seen or does she have to follow the chain of command to get an appointment. There are no old records available from [facility] [place] or in [place] or in purple charts. It looks as though she was last evaulated by Dr Baruch Gouty in Jan 2003. Please advise

## 2017-09-29 NOTE — Telephone Encounter (Signed)
noted 

## 2017-10-01 NOTE — Telephone Encounter (Signed)
Patient was instructed to see Dr Kathyrn Sheriff for a biopsy of her lump first.

## 2017-10-02 ENCOUNTER — Other Ambulatory Visit: Payer: Self-pay | Admitting: Otolaryngology

## 2017-10-02 DIAGNOSIS — R221 Localized swelling, mass and lump, neck: Secondary | ICD-10-CM

## 2017-10-05 ENCOUNTER — Ambulatory Visit: Payer: Managed Care, Other (non HMO) | Admitting: Internal Medicine

## 2017-10-07 ENCOUNTER — Telehealth: Payer: Self-pay | Admitting: Internal Medicine

## 2017-10-07 NOTE — Telephone Encounter (Signed)
noted 

## 2017-10-07 NOTE — Telephone Encounter (Signed)
I spoke with pt; [facility] do not have available appts and [facility] is too far to go. Pt wants appt today since out of work; pt will go to [facility]. FYI to Avie Echevaria NP.

## 2017-10-07 NOTE — Telephone Encounter (Signed)
Copied from [place] [phone]. Topic: Quick Communication - See Telephone Encounter >> Oct 07, 2017 10:20 AM Aurelio Brash B wrote: CRM for notification. See Telephone encounter for: 10/07/17.  PT is asking for Webb Silversmith to call her in something for her head cold - sinus pressure in face and headache - she thinks she has sinus infection-  pt has taken  benadryl and tylenol  -  [place], [place] - [place] [phone] (Phone) [phone] (Fax)

## 2017-10-09 ENCOUNTER — Other Ambulatory Visit: Payer: Self-pay | Admitting: Internal Medicine

## 2017-10-12 ENCOUNTER — Ambulatory Visit
Admission: RE | Admit: 2017-10-12 | Discharge: 2017-10-12 | Disposition: A | Discharge status: Home / Self Care | Payer: Managed Care, Other (non HMO) | Source: Ambulatory Visit | Attending: Otolaryngology | Admitting: Otolaryngology

## 2017-10-12 DIAGNOSIS — R221 Localized swelling, mass and lump, neck: Secondary | ICD-10-CM

## 2017-10-12 DIAGNOSIS — R918 Other nonspecific abnormal finding of lung field: Secondary | ICD-10-CM | POA: Insufficient documentation

## 2017-10-12 DIAGNOSIS — J984 Other disorders of lung: Secondary | ICD-10-CM | POA: Diagnosis not present

## 2017-10-12 MED ORDER — IOPAMIDOL (ISOVUE-300) INJECTION 61%
75.0000 mL | Freq: Once | INTRAVENOUS | Status: AC | PRN
  Administered 2017-10-12: 100 mL via INTRAVENOUS

## 2017-10-15 ENCOUNTER — Other Ambulatory Visit: Payer: Self-pay | Admitting: Otolaryngology

## 2017-10-15 DIAGNOSIS — R911 Solitary pulmonary nodule: Secondary | ICD-10-CM

## 2017-10-19 ENCOUNTER — Encounter (INDEPENDENT_AMBULATORY_CARE_PROVIDER_SITE_OTHER): Payer: Managed Care, Other (non HMO) | Admitting: Ophthalmology

## 2017-10-19 ENCOUNTER — Other Ambulatory Visit: Payer: Self-pay | Admitting: Otolaryngology

## 2017-10-26 ENCOUNTER — Ambulatory Visit
Admission: RE | Admit: 2017-10-26 | Discharge: 2017-10-26 | Disposition: A | Discharge status: Home / Self Care | Payer: Managed Care, Other (non HMO) | Source: Ambulatory Visit | Attending: Otolaryngology | Admitting: Otolaryngology

## 2017-10-26 DIAGNOSIS — R911 Solitary pulmonary nodule: Secondary | ICD-10-CM | POA: Diagnosis present

## 2017-10-26 DIAGNOSIS — R918 Other nonspecific abnormal finding of lung field: Secondary | ICD-10-CM | POA: Diagnosis not present

## 2017-10-26 LAB — POCT I-STAT CREATININE: CREATININE: 0.8 mg/dL (ref 0.44–1.00)

## 2017-10-26 MED ORDER — IOPAMIDOL (ISOVUE-300) INJECTION 61%
75.0000 mL | Freq: Once | INTRAVENOUS | Status: AC | PRN
  Administered 2017-10-26: 75 mL via INTRAVENOUS

## 2017-11-05 ENCOUNTER — Ambulatory Visit: Payer: Managed Care, Other (non HMO) | Admitting: Cardiothoracic Surgery

## 2017-11-05 ENCOUNTER — Encounter: Payer: Self-pay | Admitting: Cardiothoracic Surgery

## 2017-11-05 VITALS — BP 144/93 | HR 73 | Temp 97.8°F | Wt 129.0 lb

## 2017-11-05 DIAGNOSIS — R918 Other nonspecific abnormal finding of lung field: Secondary | ICD-10-CM

## 2017-11-05 NOTE — Progress Notes (Signed)
Patient ID: Krystal Walker, female   DOB: 07-12-1955, 62 y.o.   MRN: [id number]  Chief Complaint  Patient presents with  . New Patient (Initial Visit)    pulmonary nodule    Referred By Dr. Clyde Canterbury Reason for Referral right upper lobe mass  HPI Location, Quality, Duration, Severity, Timing, Context, Modifying Factors, Associated Signs and Symptoms.  Krystal Walker is a 62 y.o. female.  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  This was approximately 20 years ago but recently developed a nodule on the right side of her neck and she sought attention with Dr. Richardson Landry.  A CT scan of the neck was performed which revealed in the right upper lung zone and irregularly-shaped partially calcified mass.  A dedicated chest CT was then performed which revealed a linear lesion within the right upper lobe which was somewhat calcified and had the appearance of a benign lesion.  Whether or not this was related to her radiation therapy is unclear but there are tattoo marks over the upper portion of her right chest.  The patient states that she is never had any prior CT scans of the chest.  There are no recent x-rays.  She states she has had no hemoptysis, fever, cough or chills.  She is sent here today for further evaluation.  She smoked a half a pack of cigarettes a day for 15 years.  There is no history of lung cancer or other lung diseases in the family.   Past Medical History:  Diagnosis Date  . Depression   . GERD (gastroesophageal reflux disease)   . GI bleed   . Hyperlipidemia   . Hypertension   . IBS (irritable bowel syndrome)   . Osteoporosis   . Raynaud disease   . 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 ABDOMINAL HYSTERECTOMY  2004   partial  . BREAST BIOPSY Left 02-23-12   w/clip - benign   . COLONOSCOPY WITH PROPOFOL N/A 08/11/2016   Procedure: COLONOSCOPY WITH PROPOFOL;  Surgeon: Jonathon Bellows, MD;  Location: [facility] ENDOSCOPY;  Service: Endoscopy;  Laterality: N/A;  . ESOPHAGOGASTRODUODENOSCOPY (EGD) WITH PROPOFOL N/A 08/10/2016   Procedure: ESOPHAGOGASTRODUODENOSCOPY (EGD) WITH PROPOFOL;  Surgeon: Wilford Corner, MD;  Location: [facility] ENDOSCOPY;  Service: Endoscopy;  Laterality: N/A;  . PAROTIDECTOMY Right    adenoid cystic carcinoma  . SALIVARY GLAND SURGERY  1999    Family History  Problem Relation Age of Onset  . Arrhythmia Mother        pacemaker  . Fainting Mother   . Hypertension Mother   . Hyperlipidemia Mother   . Breast cancer Neg Hx     Social History Social History   Tobacco Use  . Smoking status: Former Smoker    Packs/day: 1.00    Years: 15.00    Pack years: 15.00    Types: Cigarettes  . Smokeless tobacco: Never Used  . Tobacco comment: quit 1986  Substance Use Topics  . Alcohol use: No    Alcohol/week: 0.0 oz  . Drug use: No    Allergies  Allergen Reactions  . Latex Hives and Itching  . Morphine Nausea And Vomiting  . Morphine And Related Nausea And Vomiting  . Nickel Other (See Comments)    Current Outpatient Medications  Medication Sig Dispense Refill  .  butalbital-acetaminophen-caffeine (FIORICET, ESGIC) 50-325-40 MG tablet TAKE 1 TO 2 TABLETS BY MOUTH EVERY 6 HOURS AS NEEDED FOR HEADACHE 60 tablet 2  . DUREZOL 0.05 % EMUL     . hyoscyamine (LEVSIN, ANASPAZ) 0.125 MG tablet Take by mouth.    Marland Kitchen PROLENSA 0.07 % SOLN Apply 1 drop to eye at bedtime.    . propranolol (INDERAL) 20 MG tablet Take 20 mg by mouth 2 (two) times daily.    Marland Kitchen SIMETHICONE PO Take by mouth.    . traMADol (ULTRAM) 50 MG tablet Take 50 mg by mouth every 6 (six) hours as needed.     No current facility-administered medications for this visit.       Review of Systems A complete review of systems was asked and was negative except for the following positive findings  include a recent weight loss, suspicious lesion in her neck which she is currently being evaluated for, headaches, easy bruising and enlarged lymph glands in her neck.  Blood pressure (!) 144/93, pulse 73, temperature 97.8 F (36.6 C), temperature source Oral, weight 129 lb (58.5 kg), SpO2 97 %.  Physical Exam CONSTITUTIONAL:  Pleasant, well-developed, well-nourished, and in no acute distress. EYES: Pupils equal and reactive to light, Sclera non-icteric EARS, NOSE, MOUTH AND THROAT:  The oropharynx was clear.  Dentition is good repair.  Oral mucosa pink and moist. LYMPH NODES:  Lymph nodes in the neck and axillae were normal RESPIRATORY:  Lungs were clear.  Normal respiratory effort without pathologic use of accessory muscles of respiration CARDIOVASCULAR: Heart was regular without murmurs.  There were no carotid bruits. GI: The abdomen was soft, nontender, and nondistended. There were no palpable masses. There was no hepatosplenomegaly. There were normal bowel sounds in all quadrants. GU:  Rectal deferred.   MUSCULOSKELETAL:  Normal muscle strength and tone.  No clubbing or cyanosis.   SKIN:  There were no pathologic skin lesions.  There were no nodules on palpation. NEUROLOGIC:  Sensation is normal.  Cranial nerves are grossly intact. PSYCH:  Oriented to person, place and time.  Mood and affect are normal.  Data Reviewed CT scan of the neck, CT scan of the chest and chest x-ray  I have personally reviewed the patient's imaging, laboratory findings and medical records.    Assessment    Right upper lobe mass most consistent with a benign process.  The CT scan findings would suggest that this is likely benign.  However there are no prior films to compare.  Therefore I would like to get a CT scan of the chest in 3 months time.  The patient is agreeable to doing so.  There is the possibility that this may represent a malignancy giving her prior smoking history and her radiation therapy.   Therefore we will continue to monitor the patient.    Plan    CT scan of the chest in 3 months.  I will see her at that time.     Nestor Lewandowsky, MD 11/05/2017, 10:17 AM

## 2017-11-06 ENCOUNTER — Ambulatory Visit: Payer: Managed Care, Other (non HMO) | Admitting: Cardiothoracic Surgery

## 2017-11-07 DIAGNOSIS — R079 Chest pain, unspecified: Secondary | ICD-10-CM | POA: Insufficient documentation

## 2017-11-07 NOTE — Progress Notes (Addendum)
Cardiology Office Note  Date:  11/09/2017   ID:  Krystal Walker, DOB [birth year], MRN [id number]  PCP:  Jearld Fenton, NP   Chief Complaint  Patient presents with  . other    Follow up from [facility] ER; chest pain & headache. Meds reviewed by the pt. verbally. "doing well."     HPI:  Krystal Walker is a 62 year old woman with  history of throat cancer/salivary gland,  smoked a half a pack of cigarettes a day for 15 years.   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   "figured it out" Strained a muscle, Was lifting files repeatedly while working Took pain pill after she got home from the emergency room, sx went away  She's not had any recurrence of her chest pain, denies any anginal symptoms  Other issues discussed today "knot" in right side of neck Seen by Dr. Faith Rogue 17 x 27 mm right upper lobe mass with calcification. Monitor for now  frontal headaches, periodic  elevated blood pressure which she measured compliant with her blood pressure medications.  Previous problems with statin "muscle damage"  CT chest 10/2017 Minimal coronary calcification in the aorta, no coronary calcification  EKG personally reviewed by myself on todays visit Shows NSR with T wave abn V1 to V4, rate 73 bpm Similar to ekg dating back to 1/ 2015  In 2015  donated blood in the morning.  acute onset of dizziness   had to lay down on the floor   reported systolic pressures as low as 60/40  EMTs were called that recorded blood pressure of 100    similar episode several years ago while she  was eating cereal in the morning. At that time she again called EMTs.  30 day monitor that showed no significant arrhythmia,  Stress test March 2012 showed no ischemia, normal ejection fraction. Prior EKG in March 2012 showing T-wave abnormality in leads V5, V6 EKG today showing normal sinus rhythm with rate 100 beats per minute with nonspecific T wave abnormality in leads V2 through V6, 2, 3, aVF  Brother with atrial fibrillation  PMH:   has a past medical history of Depression, GERD (gastroesophageal reflux disease), GI bleed, Hyperlipidemia, Hypertension, IBS (irritable bowel syndrome), Osteoporosis, Raynaud disease, and Salivary gland cancer ([place]) (1999).  PSH:    Past Surgical History:  Procedure Laterality Date  . ABDOMINAL HYSTERECTOMY  2004   partial  . BREAST BIOPSY Left 02-23-12   w/clip - benign  . COLONOSCOPY WITH PROPOFOL N/A 08/11/2016   Procedure: COLONOSCOPY WITH PROPOFOL;  Surgeon: Jonathon Bellows, MD;  Location: [facility] ENDOSCOPY;  Service: Endoscopy;  Laterality: N/A;  . ESOPHAGOGASTRODUODENOSCOPY (EGD) WITH PROPOFOL N/A 08/10/2016   Procedure: ESOPHAGOGASTRODUODENOSCOPY (EGD) WITH PROPOFOL;  Surgeon: Wilford Corner, MD;  Location: [facility] ENDOSCOPY;  Service: Endoscopy;  Laterality: N/A;  . PAROTIDECTOMY Right    adenoid cystic carcinoma  . SALIVARY GLAND SURGERY  1999    Current Outpatient Medications  Medication Sig Dispense Refill  . butalbital-acetaminophen-caffeine (FIORICET, ESGIC) 50-325-40 MG tablet TAKE 1 TO 2 TABLETS BY MOUTH EVERY 6 HOURS AS  NEEDED FOR HEADACHE 60 tablet 2  . DUREZOL 0.05 % EMUL     . hyoscyamine (LEVSIN, ANASPAZ) 0.125 MG tablet Take by mouth.    Marland Kitchen PROLENSA 0.07 % SOLN Apply 1 drop to eye at bedtime.    . propranolol (INDERAL) 20 MG tablet Take 20 mg by mouth 2 (two) times daily.    Marland Kitchen SIMETHICONE PO Take by mouth.    . traMADol (ULTRAM) 50 MG tablet Take 50 mg by mouth every 6 (six) hours as needed.     No current facility-administered  medications for this visit.      Allergies:   Latex; Morphine; Morphine and related; and Nickel   Social History:  The patient  reports that she has quit smoking. Her smoking use included cigarettes. She has a 15.00 pack-year smoking history. She has never used smokeless tobacco. She reports that she does not drink alcohol or use drugs.   Family History:   family history includes Arrhythmia in her mother; Fainting in her mother; Hyperlipidemia in her mother; Hypertension in her mother.    Review of Systems: Review of Systems  Constitutional: Negative.   Respiratory: Negative.   Cardiovascular: Positive for chest pain.  Gastrointestinal: Negative.   Musculoskeletal: Negative.   Neurological: Negative.   Psychiatric/Behavioral: Negative.   All other systems reviewed and are negative.    PHYSICAL EXAM: VS:  BP (!) 142/90 (BP Location: Left Arm, Patient Position: Sitting, Cuff Size: Normal)   Pulse 73   Ht 5\' 4"  (1.626 m)   Wt 130 lb 8 oz (59.2 kg)   BMI 22.40 kg/m  , BMI Body mass index is 22.4 kg/m. GEN: Well nourished, well developed, in no acute distress  HEENT: normal  Neck: no JVD, carotid bruits, or masses Cardiac: RRR; no murmurs, rubs, or gallops,no edema  Respiratory:  clear to auscultation bilaterally, normal work of breathing GI: soft, nontender, nondistended, + BS MS: no deformity or atrophy  Skin: warm and dry, no rash, leg spider angiomata Neuro:  Strength and sensation are intact Psych: euthymic mood, full affect   Recent Labs: 01/12/2017: ALT 23; TSH 2.670 09/05/2017: BUN 17; Hemoglobin 14.6; Platelets 187; Potassium 4.3; Sodium 139 10/26/2017: Creatinine, Ser 0.80    Lipid Panel Lab Results  Component Value Date   CHOL 238 (H) 07/27/2017   HDL 63 07/27/2017   LDLCALC 157 (H) 07/27/2017   TRIG 91 07/27/2017      Wt Readings from Last 3 Encounters:  11/09/17 130 lb 8 oz (59.2 kg)  11/05/17 129 lb (58.5 kg)  09/05/17 136 lb (61.7 kg)        ASSESSMENT AND PLAN:  Chest pain with moderate risk for cardiac etiology - Plan: EKG 12-Lead atypical in nature, presented after lifting files,  Better with pain meds CT chest with no coronary calification, no aortic athero No further pain on exertion No further testing at this time  Essential hypertension Elevated today, anxiety Would monitor for now  Mixed hyperlipidemia Does not want treatment at this time CT scan reassuring   History of salivary gland cancer Nodule in the lung, being monitored for Dr. Faith Rogue  Disposition:   F/U  As needed   Total encounter time more than 60 minutes  Greater than 50% was spent in counseling and coordination of care with the patient    Orders Placed This Encounter  Procedures  . EKG 12-Lead     Signed, Esmond Plants, M.D., Ph.D. 11/09/2017  [facility] [place], [place]  [phone]  

## 2017-11-09 ENCOUNTER — Encounter (INDEPENDENT_AMBULATORY_CARE_PROVIDER_SITE_OTHER): Payer: Managed Care, Other (non HMO) | Admitting: Ophthalmology

## 2017-11-09 ENCOUNTER — Ambulatory Visit: Payer: Managed Care, Other (non HMO) | Admitting: Cardiovascular Disease

## 2017-11-09 ENCOUNTER — Ambulatory Visit: Payer: Managed Care, Other (non HMO)

## 2017-11-09 ENCOUNTER — Encounter: Payer: Self-pay | Admitting: Cardiovascular Disease

## 2017-11-09 VITALS — BP 142/90 | HR 73 | Ht 64.0 in | Wt 130.5 lb

## 2017-11-09 DIAGNOSIS — H43813 Vitreous degeneration, bilateral: Secondary | ICD-10-CM | POA: Diagnosis not present

## 2017-11-09 DIAGNOSIS — H35033 Hypertensive retinopathy, bilateral: Secondary | ICD-10-CM

## 2017-11-09 DIAGNOSIS — I10 Essential (primary) hypertension: Secondary | ICD-10-CM

## 2017-11-09 DIAGNOSIS — E782 Mixed hyperlipidemia: Secondary | ICD-10-CM | POA: Diagnosis not present

## 2017-11-09 DIAGNOSIS — H59031 Cystoid macular edema following cataract surgery, right eye: Secondary | ICD-10-CM | POA: Diagnosis not present

## 2017-11-09 DIAGNOSIS — R079 Chest pain, unspecified: Secondary | ICD-10-CM

## 2017-11-09 DIAGNOSIS — Z85818 Personal history of malignant neoplasm of other sites of lip, oral cavity, and pharynx: Secondary | ICD-10-CM | POA: Diagnosis not present

## 2017-11-09 NOTE — Patient Instructions (Signed)

## 2017-11-27 ENCOUNTER — Other Ambulatory Visit: Payer: Self-pay | Admitting: Internal Medicine

## 2017-11-27 NOTE — Telephone Encounter (Signed)
Pt est care with you 04/2017 has upcoming appt for CPE... I do not see this on current list but is on historical list... Please advise

## 2017-12-28 ENCOUNTER — Other Ambulatory Visit: Payer: Self-pay | Admitting: Family Medicine

## 2017-12-28 ENCOUNTER — Other Ambulatory Visit: Payer: Self-pay | Admitting: Internal Medicine

## 2017-12-28 DIAGNOSIS — Z1231 Encounter for screening mammogram for malignant neoplasm of breast: Secondary | ICD-10-CM

## 2018-01-18 ENCOUNTER — Ambulatory Visit (INDEPENDENT_AMBULATORY_CARE_PROVIDER_SITE_OTHER): Payer: Managed Care, Other (non HMO) | Admitting: Internal Medicine

## 2018-01-18 ENCOUNTER — Encounter: Payer: Self-pay | Admitting: Internal Medicine

## 2018-01-18 ENCOUNTER — Encounter: Payer: 59 | Admitting: Family Medicine

## 2018-01-18 VITALS — BP 126/70 | HR 66 | Temp 97.9°F | Ht 64.5 in | Wt 132.0 lb

## 2018-01-18 DIAGNOSIS — K219 Gastro-esophageal reflux disease without esophagitis: Secondary | ICD-10-CM

## 2018-01-18 DIAGNOSIS — M810 Age-related osteoporosis without current pathological fracture: Secondary | ICD-10-CM | POA: Diagnosis not present

## 2018-01-18 DIAGNOSIS — R51 Headache: Secondary | ICD-10-CM

## 2018-01-18 DIAGNOSIS — Z85818 Personal history of malignant neoplasm of other sites of lip, oral cavity, and pharynx: Secondary | ICD-10-CM

## 2018-01-18 DIAGNOSIS — M1990 Unspecified osteoarthritis, unspecified site: Secondary | ICD-10-CM

## 2018-01-18 DIAGNOSIS — K582 Mixed irritable bowel syndrome: Secondary | ICD-10-CM | POA: Diagnosis not present

## 2018-01-18 DIAGNOSIS — Z0000 Encounter for general adult medical examination without abnormal findings: Secondary | ICD-10-CM

## 2018-01-18 DIAGNOSIS — F325 Major depressive disorder, single episode, in full remission: Secondary | ICD-10-CM

## 2018-01-18 DIAGNOSIS — R519 Headache, unspecified: Secondary | ICD-10-CM

## 2018-01-18 DIAGNOSIS — I10 Essential (primary) hypertension: Secondary | ICD-10-CM

## 2018-01-18 DIAGNOSIS — E782 Mixed hyperlipidemia: Secondary | ICD-10-CM

## 2018-01-18 DIAGNOSIS — I7300 Raynaud's syndrome without gangrene: Secondary | ICD-10-CM

## 2018-01-18 NOTE — Assessment & Plan Note (Signed)
Continue Fodmap diet, Benefiber, Hycosycamine, Align and Benefiber

## 2018-01-18 NOTE — Assessment & Plan Note (Signed)
Currently not an issue Will monitor 

## 2018-01-18 NOTE — Assessment & Plan Note (Signed)
In remission Will monitor 

## 2018-01-18 NOTE — Patient Instructions (Signed)
Health Maintenance for Postmenopausal Women Menopause is a normal process in which your reproductive ability comes to an end. This process happens gradually over a span of months to years, usually between the ages of 22 and 9. Menopause is complete when you have missed 12 consecutive menstrual periods. It is important to talk with your health care provider about some of the most common conditions that affect postmenopausal women, such as heart disease, cancer, and bone loss (osteoporosis). Adopting a healthy lifestyle and getting preventive care can help to promote your health and wellness. Those actions can also lower your chances of developing some of these common conditions. What should I know about menopause? During menopause, you may experience a number of symptoms, such as:  Moderate-to-severe hot flashes.  Night sweats.  Decrease in sex drive.  Mood swings.  Headaches.  Tiredness.  Irritability.  Memory problems.  Insomnia.  Choosing to treat or not to treat menopausal changes is an individual decision that you make with your health care provider. What should I know about hormone replacement therapy and supplements? Hormone therapy products are effective for treating symptoms that are associated with menopause, such as hot flashes and night sweats. Hormone replacement carries certain risks, especially as you become older. If you are thinking about using estrogen or estrogen with progestin treatments, discuss the benefits and risks with your health care provider. What should I know about heart disease and stroke? Heart disease, heart attack, and stroke become more likely as you age. This may be due, in part, to the hormonal changes that your body experiences during menopause. These can affect how your body processes dietary fats, triglycerides, and cholesterol. Heart attack and stroke are both medical emergencies. There are many things that you can do to help prevent heart disease  and stroke:  Have your blood pressure checked at least every 1-2 years. High blood pressure causes heart disease and increases the risk of stroke.  If you are 53-22 years old, ask your health care provider if you should take aspirin to prevent a heart attack or a stroke.  Do not use any tobacco products, including cigarettes, chewing tobacco, or electronic cigarettes. If you need help quitting, ask your health care provider.  It is important to eat a healthy diet and maintain a healthy weight. ? Be sure to include plenty of vegetables, fruits, low-fat dairy products, and lean protein. ? Avoid eating foods that are high in solid fats, added sugars, or salt (sodium).  Get regular exercise. This is one of the most important things that you can do for your health. ? Try to exercise for at least 150 minutes each week. The type of exercise that you do should increase your heart rate and make you sweat. This is known as moderate-intensity exercise. ? Try to do strengthening exercises at least twice each week. Do these in addition to the moderate-intensity exercise.  Know your numbers.Ask your health care provider to check your cholesterol and your blood glucose. Continue to have your blood tested as directed by your health care provider.  What should I know about cancer screening? There are several types of cancer. Take the following steps to reduce your risk and to catch any cancer development as early as possible. Breast Cancer  Practice breast self-awareness. ? This means understanding how your breasts normally appear and feel. ? It also means doing regular breast self-exams. Let your health care provider know about any changes, no matter how small.  If you are 40  or older, have a clinician do a breast exam (clinical breast exam or CBE) every year. Depending on your age, family history, and medical history, it may be recommended that you also have a yearly breast X-ray (mammogram).  If you  have a family history of breast cancer, talk with your health care provider about genetic screening.  If you are at high risk for breast cancer, talk with your health care provider about having an MRI and a mammogram every year.  Breast cancer (BRCA) gene test is recommended for women who have family members with BRCA-related cancers. Results of the assessment will determine the need for genetic counseling and BRCA1 and for BRCA2 testing. BRCA-related cancers include these types: ? Breast. This occurs in males or females. ? Ovarian. ? Tubal. This may also be called fallopian tube cancer. ? Cancer of the abdominal or pelvic lining (peritoneal cancer). ? Prostate. ? Pancreatic.  Cervical, Uterine, and Ovarian Cancer Your health care provider may recommend that you be screened regularly for cancer of the pelvic organs. These include your ovaries, uterus, and vagina. This screening involves a pelvic exam, which includes checking for microscopic changes to the surface of your cervix (Pap test).  For women ages 21-65, health care providers may recommend a pelvic exam and a Pap test every three years. For women ages 79-65, they may recommend the Pap test and pelvic exam, combined with testing for human papilloma virus (HPV), every five years. Some types of HPV increase your risk of cervical cancer. Testing for HPV may also be done on women of any age who have unclear Pap test results.  Other health care providers may not recommend any screening for nonpregnant women who are considered low risk for pelvic cancer and have no symptoms. Ask your health care provider if a screening pelvic exam is right for you.  If you have had past treatment for cervical cancer or a condition that could lead to cancer, you need Pap tests and screening for cancer for at least 20 years after your treatment. If Pap tests have been discontinued for you, your risk factors (such as having a new sexual partner) need to be  reassessed to determine if you should start having screenings again. Some women have medical problems that increase the chance of getting cervical cancer. In these cases, your health care provider may recommend that you have screening and Pap tests more often.  If you have a family history of uterine cancer or ovarian cancer, talk with your health care provider about genetic screening.  If you have vaginal bleeding after reaching menopause, tell your health care provider.  There are currently no reliable tests available to screen for ovarian cancer.  Lung Cancer Lung cancer screening is recommended for adults 69-62 years old who are at high risk for lung cancer because of a history of smoking. A yearly low-dose CT scan of the lungs is recommended if you:  Currently smoke.  Have a history of at least 30 pack-years of smoking and you currently smoke or have quit within the past 15 years. A pack-year is smoking an average of one pack of cigarettes per day for one year.  Yearly screening should:  Continue until it has been 15 years since you quit.  Stop if you develop a health problem that would prevent you from having lung cancer treatment.  Colorectal Cancer  This type of cancer can be detected and can often be prevented.  Routine colorectal cancer screening usually begins at  age 42 and continues through age 45.  If you have risk factors for colon cancer, your health care provider may recommend that you be screened at an earlier age.  If you have a family history of colorectal cancer, talk with your health care provider about genetic screening.  Your health care provider may also recommend using home test kits to check for hidden blood in your stool.  A small camera at the end of a tube can be used to examine your colon directly (sigmoidoscopy or colonoscopy). This is done to check for the earliest forms of colorectal cancer.  Direct examination of the colon should be repeated every  5-10 years until age 71. However, if early forms of precancerous polyps or small growths are found or if you have a family history or genetic risk for colorectal cancer, you may need to be screened more often.  Skin Cancer  Check your skin from head to toe regularly.  Monitor any moles. Be sure to tell your health care provider: ? About any new moles or changes in moles, especially if there is a change in a mole's shape or color. ? If you have a mole that is larger than the size of a pencil eraser.  If any of your family members has a history of skin cancer, especially at a young age, talk with your health care provider about genetic screening.  Always use sunscreen. Apply sunscreen liberally and repeatedly throughout the day.  Whenever you are outside, protect yourself by wearing long sleeves, pants, a wide-brimmed hat, and sunglasses.  What should I know about osteoporosis? Osteoporosis is a condition in which bone destruction happens more quickly than new bone creation. After menopause, you may be at an increased risk for osteoporosis. To help prevent osteoporosis or the bone fractures that can happen because of osteoporosis, the following is recommended:  If you are 46-71 years old, get at least 1,000 mg of calcium and at least 600 mg of vitamin D per day.  If you are older than age 55 but younger than age 65, get at least 1,200 mg of calcium and at least 600 mg of vitamin D per day.  If you are older than age 54, get at least 1,200 mg of calcium and at least 800 mg of vitamin D per day.  Smoking and excessive alcohol intake increase the risk of osteoporosis. Eat foods that are rich in calcium and vitamin D, and do weight-bearing exercises several times each week as directed by your health care provider. What should I know about how menopause affects my mental health? Depression may occur at any age, but it is more common as you become older. Common symptoms of depression  include:  Low or sad mood.  Changes in sleep patterns.  Changes in appetite or eating patterns.  Feeling an overall lack of motivation or enjoyment of activities that you previously enjoyed.  Frequent crying spells.  Talk with your health care provider if you think that you are experiencing depression. What should I know about immunizations? It is important that you get and maintain your immunizations. These include:  Tetanus, diphtheria, and pertussis (Tdap) booster vaccine.  Influenza every year before the flu season begins.  Pneumonia vaccine.  Shingles vaccine.  Your health care provider may also recommend other immunizations. This information is not intended to replace advice given to you by your health care provider. Make sure you discuss any questions you have with your health care provider. Document Released: 07/18/2005  Document Revised: 12/14/2015 Document Reviewed: 02/27/2015 Elsevier Interactive Patient Education  2018 [employer]  

## 2018-01-18 NOTE — Assessment & Plan Note (Signed)
Controlled on Propanolol and HCTZ Recent CMET reviewed Reinforced DASH diet

## 2018-01-18 NOTE — Assessment & Plan Note (Signed)
Continue CBD oil

## 2018-01-18 NOTE — Assessment & Plan Note (Signed)
She refuses statin Encouraged Red Yeast Rice Encouraged her to consume a low fat diet

## 2018-01-18 NOTE — Assessment & Plan Note (Signed)
Continue Calcium and Vit D Encouraged weight bearing exercise

## 2018-01-18 NOTE — Assessment & Plan Note (Signed)
Continue Propanolol and Fioricet She will continue to follow with Neurology

## 2018-01-18 NOTE — Progress Notes (Signed)
Subjective:    Patient ID: Krystal Walker, female    DOB: 1955/11/12, 62 y.o.   MRN: [id number]  HPI  Pt presents to the clinic today for her annual exam. She is also due to follow up chronic conditions.  Depression: Not currently an issue. She is not  Taking any antidepressants at this time. She denies SI/HI.  GERD: Triggered by Meloxicam which she is no longer taking. She no longer takes Omeprazole daily with good relief.  HTN: Her BP today is 126/70. She is taking Propanolol and HCTZ as prescribed.   IBS: Alternating constipation and diarrhea. She consume a FODMAP diet. She takes Hyoscyamine, Align, Mylanta, Benefiber and Mirilax as needed.  Raynauds: Currently not an issue. Not on any Calcium Channel Blockers.  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 No chemo. She no longer follows with oncology.  Osteoporosis: Her last bone density was in 2011. She is taking Calcium and Vit D daily. She tries to get 30 minutes of weight bearing exercise daily.   Frequent Headaches: Stable on Propanolol. She takes Fioricet for breakthrough. She follows with Dr. Melrose Nakayama.  Arthritis: Mainly in her knees. She is taking CBD oil instead of Meloxicam. She takes Tramadol for severe pain.  Flu: 02/2017 Tetanus: 06/2012 Zostovax:  04/2016 Shingrix:  never Pap Smear: 10/2009, partial hysterectomy Mammogram: 12/2016, scheduled 01/2018 Colon Screening: 08/2016 Vision Screening every 3 months Dentist: annually  Diet: She does eat meat. She consumes fruits and veggies daily. She tries to avoid fried foods. She drinks mostly water. Exercise: Walking  Review of Systems      Past Medical History:  Diagnosis Date  . Depression   . GERD (gastroesophageal reflux disease)   . GI bleed   . Hyperlipidemia   . Hypertension   . IBS (irritable bowel syndrome)   . Osteoporosis   . Raynaud disease   . 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 butalbital-acetaminophen-caffeine (FIORICET, ESGIC) 50-325-40 MG tablet TAKE 1 TO 2 TABLETS BY MOUTH EVERY 6 HOURS AS NEEDED FOR HEADACHE 60 tablet 2  . DUREZOL 0.05 % EMUL     . hyoscyamine (LEVSIN, ANASPAZ) 0.125 MG tablet Take by mouth.    . meloxicam (MOBIC) 15 MG tablet TAKE 1 TABLET BY MOUTH  DAILY 90 tablet 1  . PROLENSA 0.07 % SOLN Apply 1 drop to eye at bedtime.    . propranolol (INDERAL) 20 MG tablet Take 20 mg by mouth 2 (two) times daily.    Marland Kitchen SIMETHICONE PO Take by mouth.    . traMADol (ULTRAM) 50 MG tablet Take 50 mg by mouth every 6 (six) hours as needed.     No current facility-administered medications for this visit.     Allergies  Allergen Reactions  . Latex Hives and Itching  . Morphine Nausea And Vomiting  . Morphine And Related Nausea And Vomiting  . Nickel Other (See Comments)    Family History  Problem Relation Age of Onset  . Arrhythmia Mother        pacemaker  . Fainting Mother   . Hypertension Mother   . Hyperlipidemia Mother   . Breast cancer Neg Hx     Social History   Socioeconomic History  . Marital status: Single    Spouse name: Not on file  . Number of children: Not on file  . Years of  education: Not on file  . Highest education level: Not on file  Occupational History  . Not on file  Social Needs  . Financial resource strain: Not on file  . Food insecurity:    Worry: Not on file    Inability: Not on file  . Transportation needs:    Medical: Not on file    Non-medical: Not on file  Tobacco Use  . Smoking status: Former Smoker    Packs/day: 1.00    Years: 15.00    Pack years: 15.00    Types: Cigarettes  . Smokeless tobacco: Never Used  . Tobacco comment: quit 1986  Substance and Sexual Activity  . Alcohol use: No    Alcohol/week: 0.0 standard drinks  . Drug use: No  . Sexual activity: Not Currently  Lifestyle  . Physical activity:    Days per week:  Not on file    Minutes per session: Not on file  . Stress: Not on file  Relationships  . Social connections:    Talks on phone: Not on file    Gets together: Not on file    Attends religious service: Not on file    Active member of club or organization: Not on file    Attends meetings of clubs or organizations: Not on file    Relationship status: Not on file  . Intimate partner violence:    Fear of current or ex partner: Not on file    Emotionally abused: Not on file    Physically abused: Not on file    Forced sexual activity: Not on file  Other Topics Concern  . Not on file  Social History Narrative  . Not on file     Constitutional: Denies fever, malaise, fatigue, headache or abrupt weight changes.  HEENT: Denies eye pain, eye redness, ear pain, ringing in the ears, wax buildup, runny nose, nasal congestion, bloody nose, or sore throat. Respiratory: Denies difficulty breathing, shortness of breath, cough or sputum production.   Cardiovascular: Pt reports swelling in legs. Denies chest pain, chest tightness, palpitations or swelling in the hands.  Gastrointestinal: Pt reports alternating constipation and diarrhea. Denies abdominal pain, bloating, or blood in the stool.  GU: Denies urgency, frequency, pain with urination, burning sensation, blood in urine, odor or discharge. Musculoskeletal: Pt reports intermittent joint pains. Denies decrease in range of motion, difficulty with gait, muscle pain or joint swelling.  Skin: Denies redness, rashes, lesions or ulcercations.  Neurological: Denies dizziness, difficulty with memory, difficulty with speech or problems with balance and coordination.  Psych: Denies anxiety, depression, SI/HI.  No other specific complaints in a complete review of systems (except as listed in HPI above).  Objective:   Physical Exam   BP 126/70   Pulse 66   Temp 97.9 F (36.6 C) (Oral)   Ht 5' 4.5" (1.638 m)   Wt 132 lb (59.9 kg)   SpO2 98%   BMI  22.31 kg/m  Wt Readings from Last 3 Encounters:  01/18/18 132 lb (59.9 kg)  11/09/17 130 lb 8 oz (59.2 kg)  11/05/17 129 lb (58.5 kg)    General: Appears her stated age, well developed, well nourished in NAD. Skin: Warm, dry and intact.  HEENT: Head: normal shape and size; Eyes: sclera white, no icterus, conjunctiva pink, PERRLA and EOMs intact; Ears: Tm's gray and intact, normal light reflex;  Throat/Mouth: Teeth present, mucosa pink and moist, no exudate, lesions or ulcerations noted.  Neck:  Neck supple, trachea midline. No  masses, lumps or thyromegaly present.  Cardiovascular: Normal rate and rhythm. S1,S2 noted.  No murmur, rubs or gallops noted. Trace BLE edema. No carotid bruits noted. Pulmonary/Chest: Normal effort and positive vesicular breath sounds. No respiratory distress. No wheezes, rales or ronchi noted.  Abdomen: Soft and nontender. Normal bowel sounds. No distention or masses noted. Liver, spleen and kidneys non palpable. Musculoskeletal: Strength 5/5 BUE/BLE. No difficulty with gait.  Neurological: Alert and oriented. Cranial nerves II-XII grossly intact. Coordination normal.  Psychiatric: Mood and affect normal. Behavior is normal. Judgment and thought content normal.    BMET    Component Value Date/Time   NA 139 09/05/2017 2142   NA 141 01/12/2017 0904   K 4.3 09/05/2017 2142   CL 104 09/05/2017 2142   CO2 25 09/05/2017 2142   GLUCOSE 146 (H) 09/05/2017 2142   BUN 17 09/05/2017 2142   BUN 15 01/12/2017 0904   CREATININE 0.80 10/26/2017 0836   CALCIUM 9.9 09/05/2017 2142   GFRNONAA >60 09/05/2017 2142   GFRAA >60 09/05/2017 2142    Lipid Panel     Component Value Date/Time   CHOL 238 (H) 07/27/2017 0850   TRIG 91 07/27/2017 0850   HDL 63 07/27/2017 0850   CHOLHDL 3.8 07/27/2017 0850   LDLCALC 157 (H) 07/27/2017 0850    CBC    Component Value Date/Time   WBC 5.3 09/05/2017 2142   RBC 4.53 09/05/2017 2142   HGB 14.6 09/05/2017 2142   HGB 12.3  01/12/2017 0904   HCT 43.0 09/05/2017 2142   HCT 37.2 01/12/2017 0904   PLT 187 09/05/2017 2142   PLT 172 01/12/2017 0904   MCV 94.8 09/05/2017 2142   MCV 91 01/12/2017 0904   MCH 32.1 09/05/2017 2142   MCHC 33.9 09/05/2017 2142   RDW 12.7 09/05/2017 2142   RDW 15.3 01/12/2017 0904   LYMPHSABS 1.3 01/12/2017 0904   MONOABS 0.7 11/20/2016 1046   EOSABS 0.1 01/12/2017 0904   BASOSABS 0.0 01/12/2017 0904    Hgb A1C Lab Results  Component Value Date   HGBA1C 5.6 01/12/2017           Assessment & Plan:   Preventative Health Maintenance:  Encouraged her to get a flu shot in the fall Tetanus, zostovax UTD She declines shingrix No need for pap smears Mammogram schedule She does not want to repeat bone density at this time Encouraged her to consume a balanced diet and exercise regimen Advised her to see an eye doctor and dentist annually Will check CBC, Lipid and Vit D in 3 months  Return precautions discussed Webb Silversmith, NP

## 2018-01-21 ENCOUNTER — Emergency Department: Payer: Managed Care, Other (non HMO)

## 2018-01-21 ENCOUNTER — Encounter: Payer: Self-pay | Admitting: Emergency Medicine

## 2018-01-21 ENCOUNTER — Emergency Department
Admission: EM | Admit: 2018-01-21 | Discharge: 2018-01-21 | Disposition: A | Discharge status: Home / Self Care | Payer: Managed Care, Other (non HMO) | Attending: Emergency Medicine | Admitting: Emergency Medicine

## 2018-01-21 ENCOUNTER — Other Ambulatory Visit: Payer: Self-pay

## 2018-01-21 DIAGNOSIS — I10 Essential (primary) hypertension: Secondary | ICD-10-CM | POA: Insufficient documentation

## 2018-01-21 DIAGNOSIS — Z79899 Other long term (current) drug therapy: Secondary | ICD-10-CM | POA: Insufficient documentation

## 2018-01-21 DIAGNOSIS — F329 Major depressive disorder, single episode, unspecified: Secondary | ICD-10-CM | POA: Insufficient documentation

## 2018-01-21 DIAGNOSIS — Z87891 Personal history of nicotine dependence: Secondary | ICD-10-CM | POA: Diagnosis not present

## 2018-01-21 DIAGNOSIS — Z85818 Personal history of malignant neoplasm of other sites of lip, oral cavity, and pharynx: Secondary | ICD-10-CM | POA: Insufficient documentation

## 2018-01-21 DIAGNOSIS — Z91040 Latex allergy status: Secondary | ICD-10-CM | POA: Diagnosis not present

## 2018-01-21 LAB — BASIC METABOLIC PANEL
Anion gap: 9 (ref 5–15)
BUN: 14 mg/dL (ref 8–23)
CALCIUM: 9.9 mg/dL (ref 8.9–10.3)
CHLORIDE: 105 mmol/L (ref 98–111)
CO2: 25 mmol/L (ref 22–32)
Creatinine, Ser: 0.82 mg/dL (ref 0.44–1.00)
Glucose, Bld: 126 mg/dL — ABNORMAL HIGH (ref 70–99)
Potassium: 3.7 mmol/L (ref 3.5–5.1)
SODIUM: 139 mmol/L (ref 135–145)

## 2018-01-21 LAB — CBC
HCT: 43.5 % (ref 35.0–47.0)
Hemoglobin: 15 g/dL (ref 12.0–16.0)
MCH: 33.1 pg (ref 26.0–34.0)
MCHC: 34.6 g/dL (ref 32.0–36.0)
MCV: 95.7 fL (ref 80.0–100.0)
PLATELETS: 172 10*3/uL (ref 150–440)
RBC: 4.54 MIL/uL (ref 3.80–5.20)
RDW: 12.9 % (ref 11.5–14.5)
WBC: 8.3 10*3/uL (ref 3.6–11.0)

## 2018-01-21 LAB — TROPONIN I

## 2018-01-21 MED ORDER — SODIUM CHLORIDE 0.9 % IV SOLN
Freq: Once | INTRAVENOUS | Status: AC
  Administered 2018-01-21: 19:00:00 via INTRAVENOUS

## 2018-01-21 MED ORDER — PROCHLORPERAZINE EDISYLATE 10 MG/2ML IJ SOLN
10.0000 mg | Freq: Once | INTRAMUSCULAR | Status: AC
  Administered 2018-01-21: 10 mg via INTRAVENOUS
  Filled 2018-01-21: qty 2

## 2018-01-21 NOTE — ED Triage Notes (Signed)
Pt presents to ED via POV with c/o of symptomatic hypertension. Pt stated her blood pressure was 177/98 this morning. Pt complains of headache, nausea, and dizziness. Pt stated symptoms started this morning. Pt took her bp medication this morning (Propranolol). Pt denies use of blood thinners. Pt complains of chills and fever. No fever noted in triage.

## 2018-01-21 NOTE — Discharge Instructions (Addendum)
Take your propranolol 1 regular pill 20 mg in the morning and take a half of propranolol in the evening.  Please return here if you are worse again.  Please follow-up with your regular doctor in the next few days.

## 2018-01-21 NOTE — ED Provider Notes (Addendum)
[facility] Emergency Department Provider Note   ____________________________________________   First MD Initiated Contact with Patient 01/21/18 1808     (approximate)  I have reviewed the triage vital signs and the nursing notes.   HISTORY  Chief Complaint Hypertension and Headache    HPI Krystal Walker is a 62 y.o. female who reports she was taking propranolol 20 mg twice a day but her last physical her blood pressure is low when it had been low for bit so they dropped it to 1 once a day.  Today the patient's blood pressure was up in the 950D systolic.  She is having bad headache which is made worse by bright lights.  She has had headaches like this for quite some time that were initially thought to be migraines but are now thought to be due to her blood pressure.  There is diffuse headache just kind of bad pain she says.  Is been going on all day.  Severe and she took a Fioricet it did not help.  She also took another propranolol as her blood pressure was high.  Past Medical History:  Diagnosis Date  . Depression   . GERD (gastroesophageal reflux disease)   . GI bleed   . Hyperlipidemia   . Hypertension   . IBS (irritable bowel syndrome)   . Osteoporosis   . Raynaud disease   . 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 Arthritis 01/18/2018  . 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 Osteoporosis 04/20/2017  . Frequent headaches 08/13/2015  . Raynaud phenomenon 04/12/2012  . IBS (irritable bowel syndrome) 04/12/2012  . Essential hypertension   . Hyperlipidemia   . Depression   . GERD (gastroesophageal reflux disease)     Past Surgical History:  Procedure Laterality Date  . ABDOMINAL HYSTERECTOMY  2004   partial  . BREAST BIOPSY Left 02-23-12   w/clip - benign  . COLONOSCOPY WITH PROPOFOL N/A 08/11/2016   Procedure: COLONOSCOPY WITH  PROPOFOL;  Surgeon: Jonathon Bellows, MD;  Location: [facility] ENDOSCOPY;  Service: Endoscopy;  Laterality: N/A;  . ESOPHAGOGASTRODUODENOSCOPY (EGD) WITH PROPOFOL N/A 08/10/2016   Procedure: ESOPHAGOGASTRODUODENOSCOPY (EGD) WITH PROPOFOL;  Surgeon: Wilford Corner, MD;  Location: [facility] ENDOSCOPY;  Service: Endoscopy;  Laterality: N/A;  . PAROTIDECTOMY Right    adenoid cystic carcinoma  . SALIVARY GLAND SURGERY  1999    Prior to Admission medications   Medication Sig Start Date End Date Taking? Authorizing Provider  Omega-3 Fatty Acids (SALMON OIL-1000 PO) Take 2 capsules by mouth daily.   Yes [provider]  OVER THE COUNTER MEDICATION Take 25 mg by mouth daily.   Yes [provider]  propranolol (INDERAL) 20 MG tablet Take 20 mg by mouth 2 (two) times daily. 05/14/16  Yes [provider]  butalbital-acetaminophen-caffeine (FIORICET, ESGIC) 50-325-40 MG tablet TAKE 1 TO 2 TABLETS BY MOUTH EVERY 6 HOURS AS NEEDED FOR HEADACHE Patient not taking: Reported on 01/21/2018 09/07/17   Jearld Fenton, NP    Allergies Latex; Morphine; Morphine and related; and Nickel  Family History  Problem Relation Age of Onset  . Arrhythmia Mother        pacemaker  . Fainting Mother   . Hypertension Mother   . Hyperlipidemia Mother   . Breast cancer Neg Hx     Social History Social History   Tobacco Use  .  Smoking status: Former Smoker    Packs/day: 1.00    Years: 15.00    Pack years: 15.00    Types: Cigarettes  . Smokeless tobacco: Never Used  . Tobacco comment: quit 1986  Substance Use Topics  . Alcohol use: No    Alcohol/week: 0.0 standard drinks  . Drug use: No    Review of Systems  Constitutional: No fever/chills Eyes: No visual changes. ENT: No sore throat. Cardiovascular: Denies chest pain. Respiratory: Denies shortness of breath. Gastrointestinal: No abdominal pain.  No nausea, no vomiting.  No diarrhea.  No constipation. Genitourinary: Negative for  dysuria. Musculoskeletal: Negative for back pain. Skin: Negative for rash. Neurological: Negative for focal weakness   ____________________________________________   PHYSICAL EXAM:  VITAL SIGNS: ED Triage Vitals  Enc Vitals Group     BP 01/21/18 1758 (!) 184/119     Pulse Rate 01/21/18 1758 83     Resp --      Temp 01/21/18 1758 97.7 F (36.5 C)     Temp Source 01/21/18 1758 Oral     SpO2 01/21/18 1758 100 %     Weight 01/21/18 1753 130 lb (59 kg)     Height 01/21/18 1753 5\' 4"  (1.626 m)     Head Circumference --      Peak Flow --      Pain Score 01/21/18 1753 10     Pain Loc --      Pain Edu? --      Excl. in [place]? --     Constitutional: Alert and oriented.  Uncomfortable Eyes: Conjunctivae are normal. PERRL. EOMI. funduscopic exam looks normal bilaterally Head: Atraumatic. Nose: No congestion/rhinnorhea. Mouth/Throat: Mucous membranes are moist.  Oropharynx non-erythematous. Neck: No stridor.  Cardiovascular: Normal rate, regular rhythm. Grossly normal heart sounds.  Good peripheral circulation. Respiratory: Normal respiratory effort.  No retractions. Lungs CTAB. Gastrointestinal: Soft and nontender. No distention. No abdominal bruits. No CVA tenderness. Musculoskeletal: No lower extremity tenderness nor edema.   Neurologic:  Normal speech and language. No gross focal neurologic deficits are appreciated.  Specifically cranial nerves II through XII are intact although visual fields were not checked.  Cerebellar finger-nose is normal bilaterally and motor strength is 5/5 throughout.  Patient does not complain of any numbness. Skin:  Skin is warm, dry and intact. No rash noted. Psychiatric: Mood and affect are normal. Speech and behavior are normal.  ____________________________________________   LABS (all labs ordered are listed, but only abnormal results are displayed)  Labs Reviewed  BASIC METABOLIC PANEL - Abnormal; Notable for the following components:      Result  Value   Glucose, Bld 126 (*)    All other components within normal limits  CBC  TROPONIN I   ____________________________________________  EKG EKG read interpreted by me shows normal sinus rhythm rate of 75 normal axis nonspecific ST-T wave changes  ____________________________________________  RADIOLOGY  ED MD interpretation:    Official radiology report(s): Dg Chest 2 View  Result Date: 01/21/2018 CLINICAL DATA:  Left-sided chest pain. EXAM: CHEST - 2 VIEW COMPARISON:  Chest CT 10/26/2017 FINDINGS: Cardiomediastinal silhouette is normal. Mediastinal contours appear intact. There is no evidence of focal airspace consolidation, pleural effusion or pneumothorax. Persistent opacity in the right upper lobe. Osseous structures are without acute abnormality. Soft tissues are grossly normal. IMPRESSION: Persistent opacity in the right upper lobe, better evaluated on prior chest CT. Otherwise no evidence of acute abnormalities within the thorax. Electronically Signed   By: Thomas Hoff  Dimitrova M.D.   On: 01/21/2018 18:34    ____________________________________________   PROCEDURES  Procedure(s) performed:   Procedures  Critical Care performed:  ____________________________________________   INITIAL IMPRESSION / ASSESSMENT AND PLAN / ED COURSE  Patient's blood pressure is now 159 or 116 headache really is not any better.  I will try some Compazine and see if that helps since she has a history of migraine headaches what were thought to be migraine headaches at least.  This does not work and may try some labetalol or something else to drop her blood pressure slightly more. ----------------------------------------- 8:02 PM on 01/21/2018 -----------------------------------------  Patient's blood pressure is now normal.  Her headache is gone nausea is gone.  I will discharge her.  I will have her start atenolol 120 mg pill in 1 morning and one half in the evening.  I have her follow-up  with her primary care doc in the next few days to see how she is doing she will return if she is worse.        ____________________________________________   FINAL CLINICAL IMPRESSION(S) / ED DIAGNOSES  Final diagnoses:  Hypertension, unspecified type     ED Discharge Orders    None       Note:  This document was prepared using Dragon voice recognition software and may include unintentional dictation errors.    Nena Polio, MD 01/21/18 Glorianne Manchester    Nena Polio, MD 02/02/18 Thom Chimes

## 2018-01-21 NOTE — ED Notes (Signed)
Pt to xray at this time.

## 2018-01-25 ENCOUNTER — Ambulatory Visit
Admission: RE | Admit: 2018-01-25 | Discharge: 2018-01-25 | Disposition: A | Discharge status: Home / Self Care | Payer: Managed Care, Other (non HMO) | Source: Ambulatory Visit | Attending: Internal Medicine | Admitting: Internal Medicine

## 2018-01-25 ENCOUNTER — Ambulatory Visit: Payer: 59

## 2018-01-25 DIAGNOSIS — Z1231 Encounter for screening mammogram for malignant neoplasm of breast: Secondary | ICD-10-CM | POA: Insufficient documentation

## 2018-01-25 HISTORY — DX: Personal history of irradiation: Z92.3

## 2018-01-29 ENCOUNTER — Telehealth: Payer: Self-pay

## 2018-01-29 NOTE — Telephone Encounter (Signed)
Message left for patient to call back to see about scheduling CT Chest and follow up with Dr Genevive Bi. In recalls.

## 2018-02-15 ENCOUNTER — Other Ambulatory Visit: Payer: Self-pay

## 2018-02-15 DIAGNOSIS — R918 Other nonspecific abnormal finding of lung field: Secondary | ICD-10-CM

## 2018-02-15 NOTE — Telephone Encounter (Signed)
Patient is scheduled for CT Chest w/o contrast at [place] on 03/01/18 at 9:30 am. She will arrive by 9:15 am. She will see Dr Genevive Bi on 03/01/18 at 10:00 am.

## 2018-02-15 NOTE — Telephone Encounter (Signed)
Second message left for patient to call about scheduling CT Chest and follow up with Dr Genevive Bi.

## 2018-03-01 ENCOUNTER — Ambulatory Visit
Admission: RE | Admit: 2018-03-01 | Discharge: 2018-03-01 | Disposition: A | Discharge status: Home / Self Care | Payer: Managed Care, Other (non HMO) | Source: Ambulatory Visit | Attending: Cardiothoracic Surgery | Admitting: Cardiothoracic Surgery

## 2018-03-01 ENCOUNTER — Ambulatory Visit: Payer: Managed Care, Other (non HMO) | Admitting: Cardiothoracic Surgery

## 2018-03-01 ENCOUNTER — Other Ambulatory Visit: Payer: Self-pay

## 2018-03-01 ENCOUNTER — Encounter: Payer: Self-pay | Admitting: Cardiothoracic Surgery

## 2018-03-01 VITALS — BP 128/88 | HR 65 | Temp 97.5°F | Resp 11 | Ht 64.0 in | Wt 132.4 lb

## 2018-03-01 DIAGNOSIS — I700 Atherosclerosis of aorta: Secondary | ICD-10-CM | POA: Diagnosis not present

## 2018-03-01 DIAGNOSIS — R918 Other nonspecific abnormal finding of lung field: Secondary | ICD-10-CM | POA: Insufficient documentation

## 2018-03-01 NOTE — Progress Notes (Signed)
  Patient ID: Krystal Walker, female   DOB: [birth year], 62 y.o.   MRN: [id number]  HISTORY: Patient returns today in follow-up.  Her only complaint is that she has had some vague left-sided chest pain.  She did present to the emergency department for that but no exact etiology was determined.  This happens a few times a day lasting only a few seconds.  She states it feels like gas type pains.  She has had no shortness of breath cough or hemoptysis.   Vitals:   03/01/18 1005  BP: 128/88  Pulse: 65  Resp: 11  Temp: (!) 97.5 F (36.4 C)  SpO2: 95%     EXAM:    Resp: Lungs are clear bilaterally.  No respiratory distress, normal effort. Heart:  Regular without murmurs Abd:  Abdomen is soft, non distended and non tender. No masses are palpable.  There is no rebound and no guarding.  Neurological: Alert and oriented to person, place, and time. Coordination normal.  Skin: Skin is warm and dry. No rash noted. No diaphoretic. No erythema. No pallor.  Psychiatric: Normal mood and affect. Normal behavior. Judgment and thought content normal.    ASSESSMENT: I have independently reviewed the patient's CT scan from today.  There is no change in the right upper lobe process.   PLAN:   I would like to see her back again in 6 months.  She is agreeable to do so.  We will have a CT scan made at that time.    Nestor Lewandowsky, MD

## 2018-03-01 NOTE — Patient Instructions (Signed)
Patient is to return in 6 months with Ct Scan of the chest without contrast.

## 2018-06-18 ENCOUNTER — Other Ambulatory Visit: Payer: Self-pay | Admitting: Internal Medicine

## 2018-06-21 NOTE — Telephone Encounter (Signed)
Last filled 09/07/17 w/ 2 refills... last OV 01/2018 CPE

## 2018-06-23 ENCOUNTER — Encounter: Payer: Self-pay | Admitting: Internal Medicine

## 2018-06-24 MED ORDER — PROPRANOLOL HCL 20 MG PO TABS: 20.0000 mg | ORAL_TABLET | Freq: Two times a day (BID) | ORAL | 0 refills | Status: DC

## 2018-06-24 MED ORDER — PROPRANOLOL HCL 20 MG PO TABS: 20.0000 mg | ORAL_TABLET | Freq: Two times a day (BID) | ORAL | 1 refills | Status: DC

## 2018-07-19 ENCOUNTER — Other Ambulatory Visit: Payer: Self-pay | Admitting: *Deleted

## 2018-07-19 DIAGNOSIS — R918 Other nonspecific abnormal finding of lung field: Secondary | ICD-10-CM

## 2018-07-19 DIAGNOSIS — R911 Solitary pulmonary nodule: Secondary | ICD-10-CM

## 2018-07-26 ENCOUNTER — Other Ambulatory Visit: Payer: Self-pay | Admitting: Internal Medicine

## 2018-07-26 NOTE — Telephone Encounter (Signed)
Do not see Rx on current list but is on historical list... per OV note from 01/2018 see below  Arthritis: Mainly in her knees. She is taking CBD oil instead of Meloxicam. She takes Tramadol for severe pain.  Please advise

## 2018-08-30 ENCOUNTER — Other Ambulatory Visit: Payer: Self-pay

## 2018-08-30 ENCOUNTER — Ambulatory Visit
Admission: RE | Admit: 2018-08-30 | Discharge: 2018-08-30 | Disposition: A | Discharge status: Home / Self Care | Payer: Managed Care, Other (non HMO) | Source: Ambulatory Visit | Attending: Cardiothoracic Surgery | Admitting: Cardiothoracic Surgery

## 2018-08-30 ENCOUNTER — Ambulatory Visit: Payer: Managed Care, Other (non HMO) | Admitting: Cardiothoracic Surgery

## 2018-08-30 DIAGNOSIS — R911 Solitary pulmonary nodule: Secondary | ICD-10-CM

## 2018-08-30 DIAGNOSIS — R918 Other nonspecific abnormal finding of lung field: Secondary | ICD-10-CM

## 2018-09-02 ENCOUNTER — Other Ambulatory Visit: Payer: Self-pay

## 2018-09-02 ENCOUNTER — Telehealth: Payer: Self-pay

## 2018-09-02 ENCOUNTER — Ambulatory Visit: Payer: Managed Care, Other (non HMO) | Admitting: Cardiothoracic Surgery

## 2018-09-02 ENCOUNTER — Encounter: Payer: Self-pay | Admitting: Cardiothoracic Surgery

## 2018-09-02 ENCOUNTER — Ambulatory Visit (INDEPENDENT_AMBULATORY_CARE_PROVIDER_SITE_OTHER): Payer: Managed Care, Other (non HMO) | Admitting: Cardiothoracic Surgery

## 2018-09-02 DIAGNOSIS — R911 Solitary pulmonary nodule: Secondary | ICD-10-CM | POA: Diagnosis not present

## 2018-09-02 DIAGNOSIS — R918 Other nonspecific abnormal finding of lung field: Secondary | ICD-10-CM

## 2018-09-02 NOTE — Telephone Encounter (Signed)
Patient called to let us know that she did not receive her scheduled 12:00 telephone call from Dr.Oaks.  She will be available after 4 today.  We need to arrange another phone call today or tomorrow.

## 2018-09-02 NOTE — Progress Notes (Signed)
Virtual Visit via Telephone Note  I connected with Krystal Walker on 09/02/18 at  3:00 PM EDT by telephone and verified that I am speaking with the correct person using two identifiers.   I discussed the limitations, risks, security and privacy concerns of performing an evaluation and management service by telephone and the availability of in person appointments. I also discussed with the patient that there may be a patient responsible charge related to this service. The patient expressed understanding and agreed to proceed.   History of Present Illness: She states that she is done well since she was last seen.  She denies any fevers or chills.  She denies any cough.  She did have some hypertension that was associated with a migraine.  She treated her migraine and that improved her blood pressure.  It was as high as 180/106.  That happened a few days ago.  She states that she also has some tendinitis in her ankle.    Observations/Objective: I have independently reviewed her chest CT.  There is a irregular mass in the right upper lobe with coarse calcifications.  There is no change in this from the one back in May.   Assessment and Plan: I had reviewed with the patient the results of the CT scan.  I would like to repeat her scan and 14 months.  She is agreeable to coming back to the office at that time.   Follow Up Instructions: We will see her back again in 14 months.  Our office will contact her for that.    I discussed the assessment and treatment plan with the patient. The patient was provided an opportunity to ask questions and all were answered. The patient agreed with the plan and demonstrated an understanding of the instructions.   The patient was advised to call back or seek an in-person evaluation if the symptoms worsen or if the condition fails to improve as anticipated.  I provided 15 minutes of non-face-to-face time during this encounter.   Nestor Lewandowsky, MD

## 2018-09-03 ENCOUNTER — Ambulatory Visit: Payer: Managed Care, Other (non HMO)

## 2018-09-03 NOTE — Telephone Encounter (Signed)
Per Dr.Oaks CT chest w/o contrast in 14 months with follow up with Dr.Oaks after scan.  CT chest scan order placed. Placed in recall box.

## 2018-10-30 ENCOUNTER — Other Ambulatory Visit: Payer: Self-pay | Admitting: Internal Medicine

## 2018-11-18 ENCOUNTER — Ambulatory Visit: Payer: Self-pay | Admitting: *Deleted

## 2018-11-18 NOTE — Telephone Encounter (Signed)
-----   Message from Krystal Walker sent at 11/18/2018 4:42 PM EDT -----  Patient called with complaints of pain in her big toe and heel of her foot. She states that she woke up this morning and could barely walk due to "excruciating" pain. She would like a call back from NT with recommendations/advice   Pt calling with complaints of pain in the right great toe and ball of foot since last night. Pt states she is unable to but pressure on the right foot without having on a compression type of sock and shoes. Pt states she was able to go to work today but the pain returns when she takes off her shoe. Pt currently rates her pain at 5 and has been taking Tramadol to help with the pain. Pt states she walks a lot while working at [employer] and walked 9000 steps yesterday around the neighborhood. Pt states she is unable to take off work tomorrow due to being short staffed and would like to make an appt on Monday at any time but prefers it to be earlier during the day. Pt can be contacted at [phone] but she will not be able to answer the phone on tomorrow between 7 am-3:30 pm. Pt states that a detailed message can be left on her phone.  Reason for Disposition . Numbness in one foot (i.e., loss of sensation)  Answer Assessment - Initial Assessment Questions 1. ONSET: "When did the pain start?"      Last night  2. LOCATION: "Where is the pain located?"      Big toe and ball of my foot on right side 3. PAIN: "How bad is the pain?"    (Scale 1-10; or mild, moderate, severe)   -  MILD (1-3): doesn't interfere with normal activities    -  MODERATE (4-7): interferes with normal activities (e.g., work or school) or awakens from sleep, limping    -  SEVERE (8-10): excruciating pain, unable to do any normal activities, unable to walk     5 right now but took Tramadol earlier 4. WORK OR EXERCISE: "Has there been any recent work or exercise that involved this part of the body?"      Walked yesterday 9000 steps  yesterday around the neighborhood and walks a lot while working at [employer]. CAUSE: "What do you think is causing the foot pain?"     unknown 6. OTHER SYMPTOMS: "Do you have any other symptoms?" (e.g., leg pain, rash, fever, numbness)     Tingling in foot feels different feel cold 7. PREGNANCY: "Is there any chance you are pregnant?" "When was your last menstrual period?"     n/a  Protocols used: FOOT PAIN-A-AH

## 2018-11-19 NOTE — Telephone Encounter (Signed)
I left a detailed message on patient's voice mail to call office back to schedule appointment.

## 2018-11-22 ENCOUNTER — Other Ambulatory Visit: Payer: Self-pay

## 2018-11-22 ENCOUNTER — Ambulatory Visit (INDEPENDENT_AMBULATORY_CARE_PROVIDER_SITE_OTHER)
Admission: RE | Admit: 2018-11-22 | Discharge: 2018-11-22 | Disposition: A | Discharge status: Home / Self Care | Payer: Managed Care, Other (non HMO) | Source: Ambulatory Visit | Attending: Family Medicine | Admitting: Family Medicine

## 2018-11-22 ENCOUNTER — Ambulatory Visit: Payer: Managed Care, Other (non HMO) | Admitting: Family Medicine

## 2018-11-22 ENCOUNTER — Telehealth: Payer: Self-pay

## 2018-11-22 ENCOUNTER — Encounter: Payer: Self-pay | Admitting: Family Medicine

## 2018-11-22 VITALS — BP 128/88 | HR 72 | Temp 98.1°F | Ht 64.0 in | Wt 146.0 lb

## 2018-11-22 DIAGNOSIS — M79671 Pain in right foot: Secondary | ICD-10-CM | POA: Diagnosis not present

## 2018-11-22 DIAGNOSIS — M79674 Pain in right toe(s): Secondary | ICD-10-CM | POA: Diagnosis not present

## 2018-11-22 NOTE — Progress Notes (Signed)
Ethen Bannan T. Aubry Rankin, MD [facility] and [place] at [facility] [place], [address] Phone: [phone]  FAX: [place] - 63 y.o. female  MRN [id number]  Date of Birth: 04-01-1956  Visit Date: 11/22/2018  PCP: Jearld Fenton, NP  Referred by: Jearld Fenton, NP  Chief Complaint  Patient presents with  . Right Great Toe and Side of Left Foot Pain    Started 11-17-18 with pain on her left foot. No known injury. Has been doing jumping jacks and walking more. Took tramadol 6-10 and 6-11.   Subjective:   Krystal Walker is a 63 y.o. very pleasant female patient who presents with the following:  R 1st toe and L foot:  R great toe is really the main problem. Feels like the joint. The only thing that she has done and she has exercised doing some jumping some at home. 3-4 times a week.  1 month.  Beofre then walking.  Increased her activity a lot after a period of relative inactivity including initiating a jumping jacks program.  Walking for years now. But decreased with covid.   3rd shaft tenderness on the R 4th shaft tenderness on the R  EHL on the R  Past Medical History, Surgical History, Social History, Family History, Problem List, Medications, and Allergies have been reviewed and updated if relevant.  Patient Active Problem List   Diagnosis Date Noted  . Arthritis 01/18/2018  . 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 Osteoporosis 04/20/2017  . Frequent headaches 08/13/2015  . Raynaud phenomenon 04/12/2012  . IBS (irritable bowel syndrome) 04/12/2012  . Essential hypertension   . Hyperlipidemia   . Depression   . GERD (gastroesophageal reflux disease)     Past Medical History:  Diagnosis Date  . Depression   . GERD (gastroesophageal reflux disease)   . GI bleed   . Hyperlipidemia   . Hypertension   . IBS (irritable bowel syndrome)   . Osteoporosis   . Personal history of  radiation therapy 1999  . Raynaud disease   . 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 ABDOMINAL HYSTERECTOMY  2004   partial  . BREAST BIOPSY Left 02-23-12   w/clip - benign  . COLONOSCOPY WITH PROPOFOL N/A 08/11/2016   Procedure: COLONOSCOPY WITH PROPOFOL;  Surgeon: Jonathon Bellows, MD;  Location: [facility] ENDOSCOPY;  Service: Endoscopy;  Laterality: N/A;  . ESOPHAGOGASTRODUODENOSCOPY (EGD) WITH PROPOFOL N/A 08/10/2016   Procedure: ESOPHAGOGASTRODUODENOSCOPY (EGD) WITH PROPOFOL;  Surgeon: Wilford Corner, MD;  Location: [facility] ENDOSCOPY;  Service: Endoscopy;  Laterality: N/A;  . PAROTIDECTOMY Right    adenoid cystic carcinoma  . SALIVARY GLAND SURGERY  1999    Social History   Socioeconomic History  . Marital status: Single    Spouse name: Not on file  . Number of children: Not on file  . Years of education: Not on file  . Highest education level: Not on file  Occupational History  . Not on file  Social Needs  . Financial resource strain: Not on file  . Food insecurity    Worry: Not on file    Inability: Not on file  . Transportation needs    Medical: Not on file    Non-medical: Not on file  Tobacco Use  . Smoking status: Former Smoker  Packs/day: 1.00    Years: 15.00    Pack years: 15.00    Types: Cigarettes  . Smokeless tobacco: Never Used  . Tobacco comment: quit 1986  Substance and Sexual Activity  . Alcohol use: No    Alcohol/week: 0.0 standard drinks  . Drug use: No  . Sexual activity: Not Currently  Lifestyle  . Physical activity    Days per week: Not on file    Minutes per session: Not on file  . Stress: Not on file  Relationships  . Social Herbalist on phone: Not on file    Gets together: Not on file    Attends religious service: Not on file    Active member of club or organization: Not on file    Attends meetings of clubs or organizations: Not on  file    Relationship status: Not on file  . Intimate partner violence    Fear of current or ex partner: Not on file    Emotionally abused: Not on file    Physically abused: Not on file    Forced sexual activity: Not on file  Other Topics Concern  . Not on file  Social History Narrative  . Not on file    Family History  Problem Relation Age of Onset  . Arrhythmia Mother        pacemaker  . Fainting Mother   . Hypertension Mother   . Hyperlipidemia Mother   . Breast cancer Neg Hx     Allergies  Allergen Reactions  . Latex Hives and Itching  . Morphine Nausea And Vomiting  . Morphine And Related Nausea And Vomiting  . Nickel Other (See Comments)  . Other     Medication list reviewed and updated in full in [place].  GEN: No fevers, chills. Nontoxic. Primarily MSK c/o today. MSK: Detailed in the HPI GI: tolerating PO intake without difficulty Neuro: No numbness, parasthesias, or tingling associated. Otherwise the pertinent positives of the ROS are noted above.   Objective:   BP 128/88 (BP Location: Left Arm, Patient Position: Sitting, Cuff Size: Normal)   Pulse 72   Temp 98.1 F (36.7 C) (Oral)   Ht 5\' 4"  (1.626 m)   Wt 146 lb (66.2 kg)   SpO2 98%   BMI 25.06 kg/m  Ideal body weight: 54.7 kg (120 lb 9.5 oz) Adjusted ideal body weight: 59.3 kg (130 lb 12.1 oz)    GEN: WDWN, NAD, Non-toxic, Alert & Oriented x 3 HEENT: Atraumatic, Normocephalic.  Ears and Nose: No external deformity. EXTR: No clubbing/cyanosis/edema NEURO: Normal gait.  PSYCH: Normally interactive. Conversant. Not depressed or anxious appearing.  Calm demeanor.    Right foot and ankle: Full range of motion at the ankle with 5/5 strength.  She does have some increased laxity on anterior drawer testing.  Nontender at the base of the fifth, cuboid, talus.  Nontender at the navicular.  Nontender throughout all of the fifth metatarsal.  She does have some tenderness at the third and fourth  metatarsal shafts.  Nontender on the first and second metatarsal shafts.  Good range of motion at the great toe, but on the dorsum along the extensor tendon she does have some mild tenderness.  She also has some relative decreased new onset sensation to gross touch.  Radiology: Dg Foot Complete Right  Result Date: 11/22/2018 CLINICAL DATA:  Right foot pain, great toe, 3rd and 4th shafts. No injury. EXAM: RIGHT FOOT COMPLETE - 3+  VIEW COMPARISON:  None. FINDINGS: There is no evidence of fracture or dislocation. There is no evidence of arthropathy or other focal bone abnormality. Soft tissues are unremarkable. IMPRESSION: Negative. Electronically Signed   By: Rolm Baptise M.D.   On: 11/22/2018 20:12     Assessment and Plan:     ICD-10-CM   1. Acute foot pain, right  M79.671 DG Foot Complete Right  2. Great toe pain, right  M79.674 DG Foot Complete Right   Level of Medical Decision-Making in this case is Moderate.  Probable stress reaction of bone third and fourth metatarsal shafts, extensor hallucis longus tendinopathy.  She has a very good stiff shoe that she is wearing right now, and Emina have her wear this for 2 weeks, and suspect that this will be enough relative immobilization to get her through.  If she still having symptoms at that point, I am and have her in a postoperative shoe.  No weightbearing exercise until better.  Follow-up: No follow-ups on file.  No orders of the defined types were placed in this encounter.  Orders Placed This Encounter  Procedures  . DG Foot Complete Right    Signed,  Milano Rosevear T. Darlin Stenseth, MD   Outpatient Encounter Medications as of 11/22/2018  Medication Sig  . butalbital-acetaminophen-caffeine (FIORICET, ESGIC) 50-325-40 MG tablet TAKE 1 TO 2 TABLETS EVERY 6 HOURS AS NEEDED FOR HEADACHE  . hyoscyamine (LEVSIN SL) 0.125 MG SL tablet TAKE ONE TABLET EVERY 6 HOURS AS NEEDED FOR CRAMPING OR DIARRHEA  . Omega-3 Fatty Acids (SALMON OIL-1000 PO)  Take 2 capsules by mouth daily.  Marland Kitchen OVER THE COUNTER MEDICATION Take 25 mg by mouth daily.  . propranolol (INDERAL) 20 MG tablet TAKE 1 TABLET BY MOUTH TWO  TIMES DAILY  . traMADol (ULTRAM) 50 MG tablet TAKE ONE TABLET EVERY 6 HOURS AS NEEDED FOR MODERATE PAIN  . triamcinolone cream (KENALOG) 0.5 % triamcinolone acetonide 0.5 % topical cream  . [DISCONTINUED] Bromfenac Sodium (PROLENSA) 0.07 % SOLN Prolensa 0.07 % eye drops  . [DISCONTINUED] Difluprednate (DUREZOL) 0.05 % EMUL Durezol 0.05 % eye drops  . [DISCONTINUED] Influenza vac split quadrivalent PF (FLUARIX) 0.5 ML injection Afluria Quad 2018-2019 (PF) 60 mcg (15 mcg x 4)/0.5 mL IM syringe  ADM 0.5ML IM UTD  . [DISCONTINUED] levofloxacin (LEVAQUIN) 500 MG tablet levofloxacin 500 mg tablet  . [DISCONTINUED] meloxicam (MOBIC) 15 MG tablet meloxicam 15 mg tablet  . [DISCONTINUED] omeprazole (PRILOSEC) 40 MG capsule omeprazole 40 mg capsule,delayed release   No facility-administered encounter medications on file as of 11/22/2018.

## 2018-11-22 NOTE — Telephone Encounter (Signed)
[place] Night - Client Nonclinical Telephone Record AccessNurse Client [facility] Night - Client Client Site [place] Physician Webb Silversmith - NP Contact Type Call Who Is Calling Patient / Member / Family / Caregiver Caller Name [place] Phone Number [phone] Patient Name Krystal Walker Patient DOB August 08, 1955 Call Type Message Only Information Provided Reason for Call Request to Schedule Office Appointment Initial Comment Caller states she is returning a phone call in reference to scheduling an appointment. Declined triage. She wants to schedule an appointment. Additional Comment Call Closed By: Shireen Quan Transaction Date/Time: 11/19/2018 6:04:51 PM (ET)

## 2018-11-22 NOTE — Telephone Encounter (Signed)
Pt has appt with Dr Lorelei Pont 11/22/18 at 11:40.

## 2018-11-22 NOTE — Telephone Encounter (Signed)
[place] Night - Client TELEPHONE ADVICE RECORD AccessNurse Patient Name: Krystal Walker Gender: Female DOB: 1955-11-12 Age: 63 Y 91 M 26 D Return Phone Number: [phone] (Primary) Address: City/State/Zip: [place] [address] Client [facility] Night - Client Client Site [place] Physician Webb Silversmith - NP Contact Type Call Who Is Calling Patient / Member / Family / Caregiver Call Type Triage / Clinical Relationship To Patient Self Return Phone Number [phone] (Primary) Chief Complaint Foot Pain Reason for Call Symptomatic / Request for [place] states that she is trying to get an appointment to be seen today. She cannot put pressure on her right foot, big toe, and the ball of her foot. She has been taking Tramadol. Also, can she take her migraine medication at the same time? Translation No Nurse Assessment Nurse: Rene Kocher, RN, [place] Date/Time (Eastern Time): 11/22/2018 7:51:05 AM Confirm and document reason for call. If symptomatic, describe symptoms. ---caller states she is having pain in right big toe and ball of foot. pain onset Wednesday. Has the patient had close contact with a person known or suspected to have the novel coronavirus illness OR traveled / lives in area with major community spread (including international travel) in the last 14 days from the onset of symptoms? * If Asymptomatic, screen for exposure and travel within the last 14 days. ---No Does the patient have any new or worsening symptoms? ---Yes Will a triage be completed? ---Yes Related visit to physician within the last 2 weeks? ---No Does the PT have any chronic conditions? (i.e. diabetes, asthma, this includes High risk factors for pregnancy, etc.) ---No Is this a behavioral health or substance abuse call? ---No Guidelines Guideline Title Affirmed Question Affirmed Notes  Nurse Date/Time Eilene Ghazi Time) Foot Pain [1] MODERATE pain (e.g., interferes with normal activities, limping) AND [2] present > 3 days Coulter, RN, [facility] 11/22/2018 7:52:04 AM Disp. Time Eilene Ghazi Time) Disposition Final User PLEASE NOTE: All timestamps contained within this report are represented as [place] Standard Time. CONFIDENTIALTY NOTICE: This fax transmission is intended only for the addressee. It contains information that is legally privileged, confidential or otherwise protected from use or disclosure. If you are not the intended recipient, you are strictly prohibited from reviewing, disclosing, copying using or disseminating any of this information or taking any action in reliance on or regarding this information. If you have received this fax in error, please notify us immediately by telephone so that we can arrange for its return to [place]. Phone: [phone], Toll-Free: [phone], Fax: [phone] Page: 2 of 2 Call Id: [id number] 11/22/2018 7:56:50 AM See PCP within 24 Hours Yes Coulter, RN, [place] Disposition Overriden: SEE PCP WITHIN 3 DAYS Override Reason: Patient's symptoms need a higher level of care Caller Disagree/Comply Comply Caller Understands Yes PreDisposition Call Doctor Care Advice Given Per Guideline SEE PCP WITHIN 24 HOURS: * For pain relief, take acetaminophen, ibuprofen, or naproxen. CALL BACK IF: * You become worse. CARE ADVICE given per Foot Pain (Adult) guideline. Comments User: [place], [place], RN Date/Time (Eastern Time): 11/22/2018 7:58:56 AM warm transferred caller to [phone] for appt. scheduling. Referrals REFERRED TO PCP OFFICE

## 2018-11-23 ENCOUNTER — Encounter: Payer: Self-pay | Admitting: Family Medicine

## 2018-11-24 NOTE — Telephone Encounter (Signed)
Pt was seen on 11/22/18.

## 2018-12-13 ENCOUNTER — Other Ambulatory Visit: Payer: Self-pay | Admitting: Internal Medicine

## 2018-12-13 DIAGNOSIS — Z1231 Encounter for screening mammogram for malignant neoplasm of breast: Secondary | ICD-10-CM

## 2019-01-28 ENCOUNTER — Other Ambulatory Visit: Payer: Self-pay | Admitting: Internal Medicine

## 2019-01-31 ENCOUNTER — Ambulatory Visit (INDEPENDENT_AMBULATORY_CARE_PROVIDER_SITE_OTHER): Payer: Managed Care, Other (non HMO) | Admitting: Internal Medicine

## 2019-01-31 ENCOUNTER — Encounter: Payer: Self-pay | Admitting: Internal Medicine

## 2019-01-31 ENCOUNTER — Other Ambulatory Visit: Payer: Self-pay

## 2019-01-31 VITALS — BP 124/82 | HR 73 | Temp 98.5°F | Ht 64.5 in | Wt 148.0 lb

## 2019-01-31 DIAGNOSIS — E782 Mixed hyperlipidemia: Secondary | ICD-10-CM

## 2019-01-31 DIAGNOSIS — K582 Mixed irritable bowel syndrome: Secondary | ICD-10-CM

## 2019-01-31 DIAGNOSIS — R51 Headache: Secondary | ICD-10-CM | POA: Diagnosis not present

## 2019-01-31 DIAGNOSIS — M816 Localized osteoporosis [Lequesne]: Secondary | ICD-10-CM

## 2019-01-31 DIAGNOSIS — I10 Essential (primary) hypertension: Secondary | ICD-10-CM | POA: Diagnosis not present

## 2019-01-31 DIAGNOSIS — I7300 Raynaud's syndrome without gangrene: Secondary | ICD-10-CM

## 2019-01-31 DIAGNOSIS — Z0000 Encounter for general adult medical examination without abnormal findings: Secondary | ICD-10-CM

## 2019-01-31 DIAGNOSIS — F325 Major depressive disorder, single episode, in full remission: Secondary | ICD-10-CM

## 2019-01-31 DIAGNOSIS — M1990 Unspecified osteoarthritis, unspecified site: Secondary | ICD-10-CM | POA: Diagnosis not present

## 2019-01-31 DIAGNOSIS — R519 Headache, unspecified: Secondary | ICD-10-CM

## 2019-01-31 DIAGNOSIS — R635 Abnormal weight gain: Secondary | ICD-10-CM

## 2019-01-31 DIAGNOSIS — K219 Gastro-esophageal reflux disease without esophagitis: Secondary | ICD-10-CM

## 2019-01-31 DIAGNOSIS — Z85818 Personal history of malignant neoplasm of other sites of lip, oral cavity, and pharynx: Secondary | ICD-10-CM

## 2019-01-31 NOTE — Assessment & Plan Note (Signed)
Continue CBD oil and Tramadol as needed Encouraged regular physical activity

## 2019-01-31 NOTE — Assessment & Plan Note (Signed)
Continue Propanolol and Fioricet as prescribed She will continue to follow with Dr. Melrose Nakayama

## 2019-01-31 NOTE — Assessment & Plan Note (Signed)
Currently not an issue Will monitor 

## 2019-01-31 NOTE — Assessment & Plan Note (Signed)
Controlled on Propanolol CMET today Reinforced DASH diet

## 2019-01-31 NOTE — Progress Notes (Signed)
Subjective:    Patient ID: Krystal Walker, female    DOB: July 02, 1955, 63 y.o.   MRN: [id number]  HPI  Pt presents to the clinic today for her annual exam. She is also due to follow up chronic conditions.  Depression: Not currently an issue. She is not taking any antidepressants at this time. She denies SI/HI.  GERD: Triggered by NSAID's which she no longer takes. She is doing well off Omeprazole. Upper GI form 08/2016 reviewed.  HTN: Her BP today is 124/82. She is taking Propanolol as prescribed. ECG from 01/2018 reviewed.  IBS: Alternating constipation and diarrhea. She takes Hyoscyamine, Align, Mylanta, Benefiber and Miralax as needed with good relief. Colonoscopy from 08/2016 reviewed.  Raynauds: Currently not an issue. She is not taking any Calcium Channel Blockers at this time.  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 She did not have chemo. She no longer follows with oncology.  Osteoporosis: Bone density from 2011 reviewed. She is taking Calcium and Vit D daily. She gets 30 minutes of weight bearing exercise daily.   Frequent Headaches: Stable on Propanolol. She takes Fioricet for breakthrough with good relief. She follows with Dr. Melrose Nakayama.  OA: Mainly in her knees. She takes CBD oil and Tramadol as needed with good relief of symptoms.  Flu: 02/2018 Tetanus: 06/2012 Zostovax: 04/2016 Shingrix: never Pap Smear: 10/2009, partial hysterectomy Mammogram: 01/2018, scheduled 02/2019 Bone Density: 2011 Colon Screening: 08/2016 Vision Screening: every 3 months Dentist: biannually  Diet: She does eat meat. She consumes fruits and veggies daily. She tries to avoid fried foods. She drinks mostly water. Exercise: Walking  Review of Systems      Past Medical History:  Diagnosis Date  . Depression   . GERD (gastroesophageal reflux disease)   . GI bleed   . Hyperlipidemia   . Hypertension   . IBS (irritable bowel syndrome)   . Osteoporosis   . Personal  history of radiation therapy 1999  . Raynaud disease   . 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 butalbital-acetaminophen-caffeine (FIORICET, ESGIC) 50-325-40 MG tablet TAKE 1 TO 2 TABLETS EVERY 6 HOURS AS NEEDED FOR HEADACHE 60 tablet 1  . hyoscyamine (LEVSIN SL) 0.125 MG SL tablet TAKE ONE TABLET EVERY 6 HOURS AS NEEDED FOR CRAMPING OR DIARRHEA    . Omega-3 Fatty Acids (SALMON OIL-1000 PO) Take 2 capsules by mouth daily.    Marland Kitchen OVER THE COUNTER MEDICATION Take 25 mg by mouth daily.    . propranolol (INDERAL) 20 MG tablet TAKE 1 TABLET BY MOUTH  TWICE DAILY 180 tablet 0  . traMADol (ULTRAM) 50 MG tablet TAKE ONE TABLET EVERY 6 HOURS AS NEEDED FOR MODERATE PAIN 30 tablet 0  . triamcinolone cream (KENALOG) 0.5 % triamcinolone acetonide 0.5 % topical cream     No current facility-administered medications for this visit.     Allergies  Allergen Reactions  . Latex Hives and Itching  . Morphine Nausea And Vomiting  . Morphine And Related Nausea And Vomiting  . Nickel Other (See Comments)  . Other     Family History  Problem Relation Age of Onset  . Arrhythmia Mother        pacemaker  . Fainting Mother   . Hypertension Mother   . Hyperlipidemia Mother   . Breast cancer Neg Hx     Social History   Socioeconomic  History  . Marital status: Single    Spouse name: Not on file  . Number of children: Not on file  . Years of education: Not on file  . Highest education level: Not on file  Occupational History  . Not on file  Social Needs  . Financial resource strain: Not on file  . Food insecurity    Worry: Not on file    Inability: Not on file  . Transportation needs    Medical: Not on file    Non-medical: Not on file  Tobacco Use  . Smoking status: Former Smoker    Packs/day: 1.00    Years: 15.00    Pack years: 15.00    Types: Cigarettes  . Smokeless tobacco:  Never Used  . Tobacco comment: quit 1986  Substance and Sexual Activity  . Alcohol use: No    Alcohol/week: 0.0 standard drinks  . Drug use: No  . Sexual activity: Not Currently  Lifestyle  . Physical activity    Days per week: Not on file    Minutes per session: Not on file  . Stress: Not on file  Relationships  . Social Herbalist on phone: Not on file    Gets together: Not on file    Attends religious service: Not on file    Active member of club or organization: Not on file    Attends meetings of clubs or organizations: Not on file    Relationship status: Not on file  . Intimate partner violence    Fear of current or ex partner: Not on file    Emotionally abused: Not on file    Physically abused: Not on file    Forced sexual activity: Not on file  Other Topics Concern  . Not on file  Social History Narrative  . Not on file     Constitutional: Pt reports weight gain. Denies fever, malaise, fatigue, headache.  HEENT: Denies eye pain, eye redness, ear pain, ringing in the ears, wax buildup, runny nose, nasal congestion, bloody nose, or sore throat. Respiratory: Denies difficulty breathing, shortness of breath, cough or sputum production.   Cardiovascular: Denies chest pain, chest tightness, palpitations or swelling in the hands or feet.  Gastrointestinal: Denies abdominal pain, bloating, constipation, diarrhea or blood in the stool.  GU: Denies urgency, frequency, pain with urination, burning sensation, blood in urine, odor or discharge. Musculoskeletal: Pt reports bilateral knee pain. Denies decrease in range of motion, difficulty with gait, muscle pain or joint swelling.  Skin: Denies redness, rashes, lesions or ulcercations.  Neurological: Denies dizziness, difficulty with memory, difficulty with speech or problems with balance and coordination.  Psych: Denies anxiety, depression, SI/HI.  No other specific complaints in a complete review of systems (except as  listed in HPI above).  Objective:   Physical Exam   BP 124/82   Pulse 73   Temp 98.5 F (36.9 C) (Temporal)   Ht 5' 4.5" (1.638 m)   Wt 148 lb (67.1 kg)   SpO2 98%   BMI 25.01 kg/m  Wt Readings from Last 3 Encounters:  01/31/19 148 lb (67.1 kg)  11/22/18 146 lb (66.2 kg)  03/01/18 132 lb 6.4 oz (60.1 kg)    General: Appears her stated age, well developed, well nourished in NAD. Skin: Warm, dry and intact. No rashes noted. HEENT: Head: normal shape and size; Eyes: sclera white, no icterus, conjunctiva pink, PERRLA and EOMs intact; Ears: Tm's gray and intact, normal light reflex;  Neck:  Neck supple, trachea midline. No masses, lumps or thyromegaly present.  Cardiovascular: Normal rate and rhythm. S1,S2 noted.  No murmur, rubs or gallops noted. No JVD or BLE edema. No carotid bruits noted. Pulmonary/Chest: Normal effort and positive vesicular breath sounds. No respiratory distress. No wheezes, rales or ronchi noted.  Abdomen: Soft and nontender. Normal bowel sounds. No distention or masses noted. Liver, spleen and kidneys non palpable. Musculoskeletal: NStrength 5/5 BUE/BLE. Wearing knee brace to right knee. No difficulty with gait.  Neurological: Alert and oriented. Cranial nerves II-XII grossly intact. Coordination normal.  Psychiatric: Mood and affect normal. Behavior is normal. Judgment and thought content normal.     BMET    Component Value Date/Time   NA 139 01/21/2018 1759   NA 141 01/12/2017 0904   K 3.7 01/21/2018 1759   CL 105 01/21/2018 1759   CO2 25 01/21/2018 1759   GLUCOSE 126 (H) 01/21/2018 1759   BUN 14 01/21/2018 1759   BUN 15 01/12/2017 0904   CREATININE 0.82 01/21/2018 1759   CALCIUM 9.9 01/21/2018 1759   GFRNONAA >60 01/21/2018 1759   GFRAA >60 01/21/2018 1759    Lipid Panel     Component Value Date/Time   CHOL 238 (H) 07/27/2017 0850   TRIG 91 07/27/2017 0850   HDL 63 07/27/2017 0850   CHOLHDL 3.8 07/27/2017 0850   LDLCALC 157 (H)  07/27/2017 0850    CBC    Component Value Date/Time   WBC 8.3 01/21/2018 1759   RBC 4.54 01/21/2018 1759   HGB 15.0 01/21/2018 1759   HGB 12.3 01/12/2017 0904   HCT 43.5 01/21/2018 1759   HCT 37.2 01/12/2017 0904   PLT 172 01/21/2018 1759   PLT 172 01/12/2017 0904   MCV 95.7 01/21/2018 1759   MCV 91 01/12/2017 0904   MCH 33.1 01/21/2018 1759   MCHC 34.6 01/21/2018 1759   RDW 12.9 01/21/2018 1759   RDW 15.3 01/12/2017 0904   LYMPHSABS 1.3 01/12/2017 0904   MONOABS 0.7 11/20/2016 1046   EOSABS 0.1 01/12/2017 0904   BASOSABS 0.0 01/12/2017 0904    Hgb A1C Lab Results  Component Value Date   HGBA1C 5.6 01/12/2017           Assessment & Plan:   Preventative Health Maintenance:  Encouraged her to get a flu shot in the fall Tetanus UTD Zostovax UTD She declines Shingrix She no longer needs pap smears, declines pelvic exam today Mammogram scheduled Bone density ordered- advised her to call and schedule with her mammogram Colon screening UTD Encouraged her to consume a balanced diet and exercise regimen Advised her to see an eye doctor or dentist annually Will check CBC, CMET, Lipid, A1C and Vit D today  Weight Gain:  TSH today  RTC in 1 year, annual exam. Webb Silversmith, NP

## 2019-01-31 NOTE — Assessment & Plan Note (Signed)
Will order bone density and Vit D today Encouraged 30 minutes of weight bearing exercise daily Take Calcium and Vit D OTC

## 2019-01-31 NOTE — Patient Instructions (Signed)

## 2019-01-31 NOTE — Assessment & Plan Note (Signed)
In remission.

## 2019-01-31 NOTE — Assessment & Plan Note (Signed)
Continue Hyoscyamine, Align, Mylanta, Benefiber and Mirilax as needed CBC and CMET today

## 2019-02-01 LAB — COMPREHENSIVE METABOLIC PANEL
ALT: 26 IU/L (ref 0–32)
AST: 25 IU/L (ref 0–40)
Albumin/Globulin Ratio: 2.2 (ref 1.2–2.2)
Albumin: 4.3 g/dL (ref 3.8–4.8)
Alkaline Phosphatase: 65 IU/L (ref 39–117)
BUN/Creatinine Ratio: 14 (ref 12–28)
BUN: 12 mg/dL (ref 8–27)
Bilirubin Total: 0.4 mg/dL (ref 0.0–1.2)
CO2: 25 mmol/L (ref 20–29)
Calcium: 9.5 mg/dL (ref 8.7–10.3)
Chloride: 103 mmol/L (ref 96–106)
Creatinine, Ser: 0.84 mg/dL (ref 0.57–1.00)
GFR calc Af Amer: 86 mL/min/{1.73_m2} (ref >59)
GFR calc non Af Amer: 74 mL/min/{1.73_m2} (ref >59)
Globulin, Total: 2 g/dL (ref 1.5–4.5)
Glucose: 95 mg/dL (ref 65–99)
Potassium: 4.7 mmol/L (ref 3.5–5.2)
Sodium: 141 mmol/L (ref 134–144)
Total Protein: 6.3 g/dL (ref 6.0–8.5)

## 2019-02-01 LAB — LIPID PANEL
Chol/HDL Ratio: 3.3 ratio (ref 0.0–4.4)
Cholesterol, Total: 242 mg/dL — ABNORMAL HIGH (ref 100–199)
HDL: 74 mg/dL (ref >39)
LDL Calculated: 150 mg/dL — ABNORMAL HIGH (ref 0–99)
Triglycerides: 89 mg/dL (ref 0–149)
VLDL Cholesterol Cal: 18 mg/dL (ref 5–40)

## 2019-02-01 LAB — CBC
Hematocrit: 40.4 % (ref 34.0–46.6)
Hemoglobin: 14.3 g/dL (ref 11.1–15.9)
MCH: 33.1 pg — ABNORMAL HIGH (ref 26.6–33.0)
MCHC: 35.4 g/dL (ref 31.5–35.7)
MCV: 94 fL (ref 79–97)
Platelets: 174 10*3/uL (ref 150–450)
RBC: 4.32 x10E6/uL (ref 3.77–5.28)
RDW: 12.1 % (ref 11.7–15.4)
WBC: 5.5 10*3/uL (ref 3.4–10.8)

## 2019-02-01 LAB — HEMOGLOBIN A1C
Est. average glucose Bld gHb Est-mCnc: 108 mg/dL
Hgb A1c MFr Bld: 5.4 % (ref 4.8–5.6)

## 2019-02-01 LAB — TSH: TSH: 1.99 u[IU]/mL (ref 0.450–4.500)

## 2019-02-01 LAB — VITAMIN D 25 HYDROXY (VIT D DEFICIENCY, FRACTURES): Vit D, 25-Hydroxy: 42.6 ng/mL (ref 30.0–100.0)

## 2019-02-15 ENCOUNTER — Encounter: Payer: Self-pay | Admitting: Internal Medicine

## 2019-03-07 ENCOUNTER — Ambulatory Visit
Admission: RE | Admit: 2019-03-07 | Discharge: 2019-03-07 | Disposition: A | Discharge status: Home / Self Care | Payer: Managed Care, Other (non HMO) | Source: Ambulatory Visit | Attending: Internal Medicine | Admitting: Internal Medicine

## 2019-03-07 DIAGNOSIS — M816 Localized osteoporosis [Lequesne]: Secondary | ICD-10-CM | POA: Diagnosis not present

## 2019-03-07 DIAGNOSIS — Z1231 Encounter for screening mammogram for malignant neoplasm of breast: Secondary | ICD-10-CM | POA: Insufficient documentation

## 2019-03-22 IMAGING — MG MM DIGITAL SCREENING BILAT W/ TOMO W/ CAD
9 of 12 series · 9 of 28 positions shown · non-contrast
Comparison: Previous exam(s).

CLINICAL DATA: Screening.

EXAM:
2D DIGITAL SCREENING BILATERAL MAMMOGRAM WITH CAD AND ADJUNCT TOMO

[R MLO]
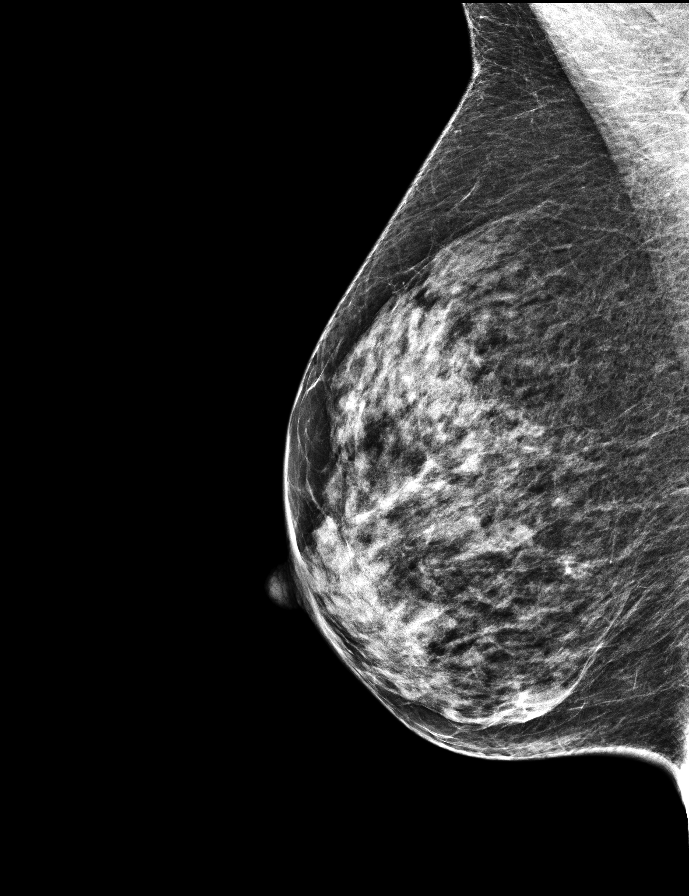

[R MLO synth-2D]
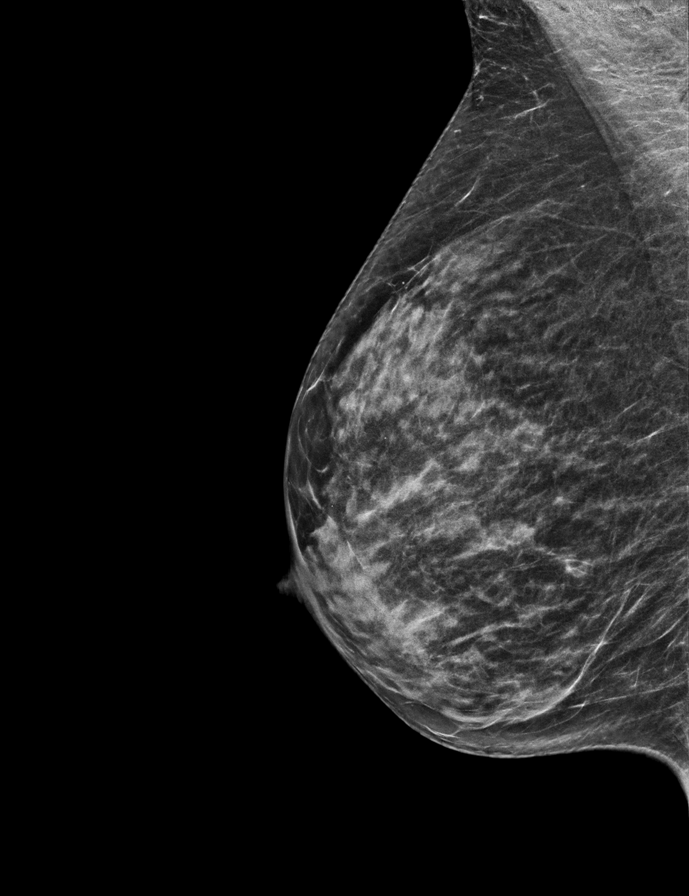

[L CC synth-2D]
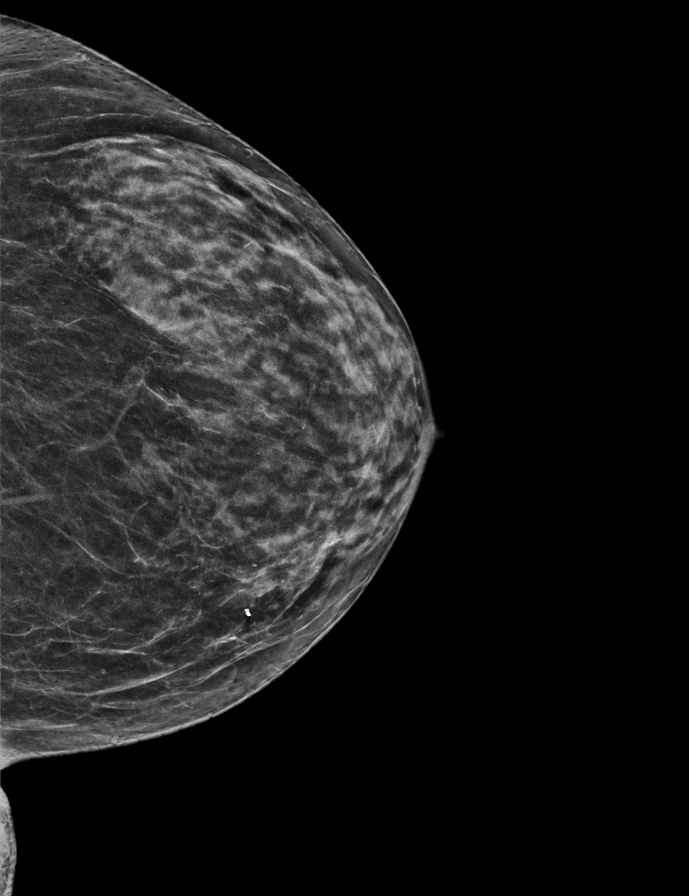

[L MLO synth-2D]
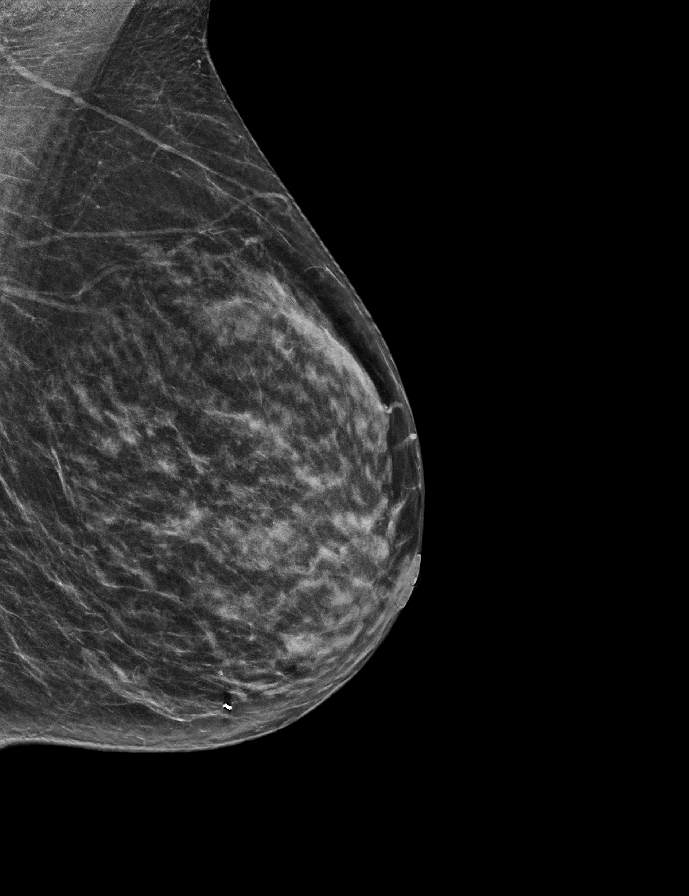

[R CC]
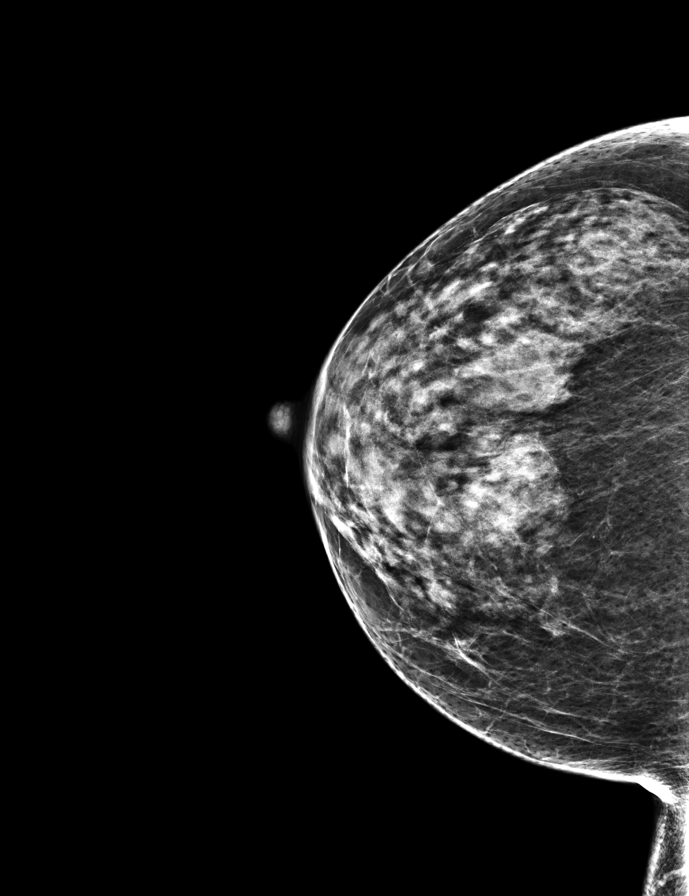

[L CC]
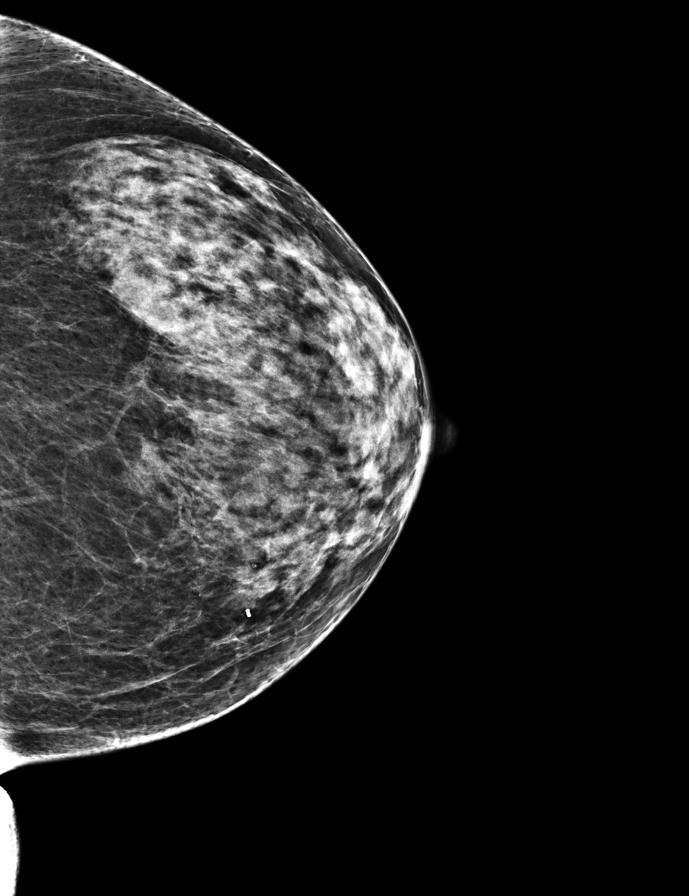

[L MLO]
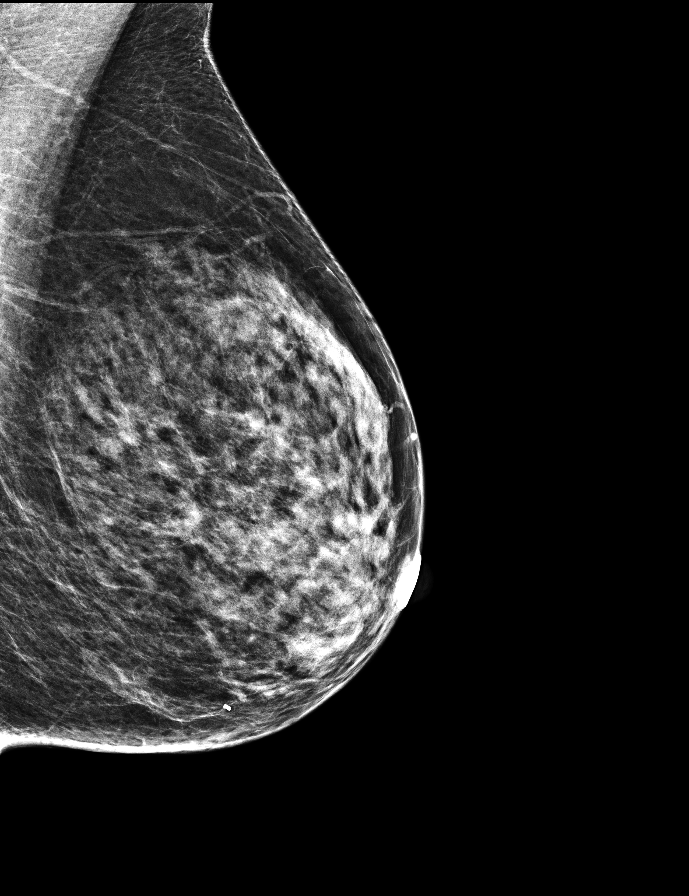

[R CC synth-2D]
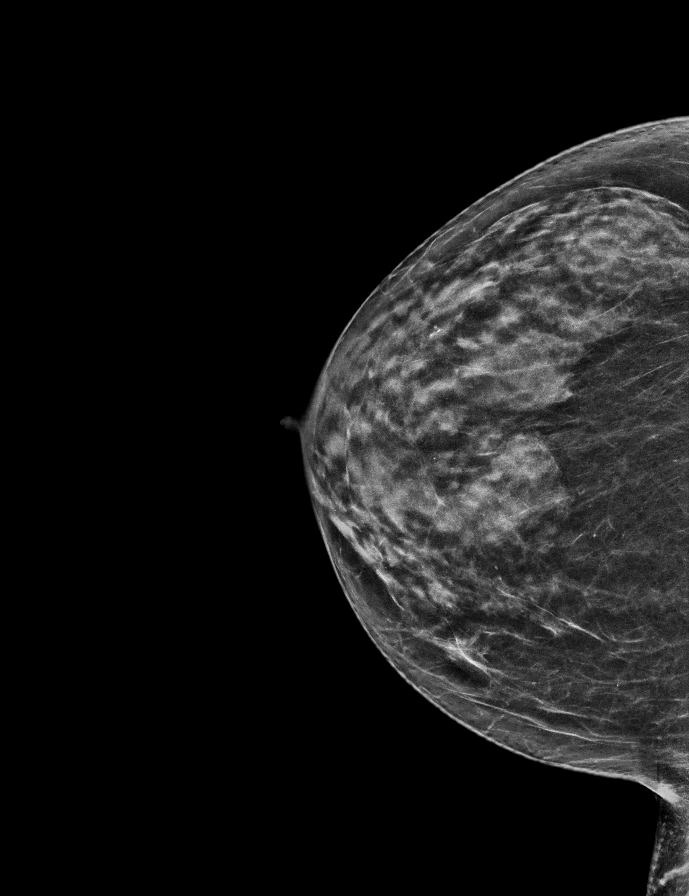

[R MLO tomo · tomo slice 28/55.0]
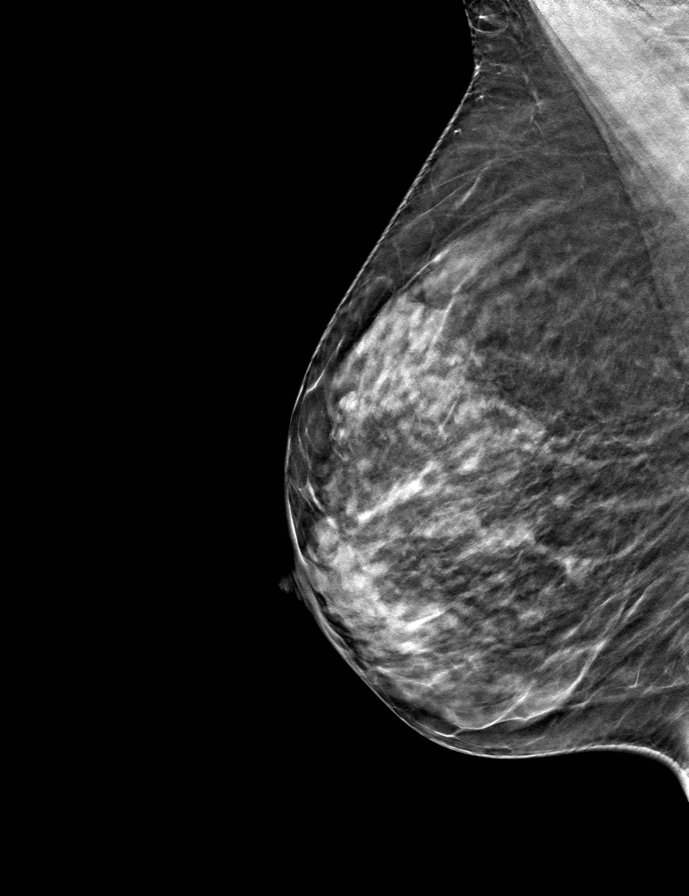

[9 of 28 positions shown; findings below may reference images not displayed]

ACR Breast Density Category c: The breast tissue is heterogeneously
dense, which may obscure small masses.
FINDINGS: There are no findings suspicious for malignancy. Images were
processed with CAD.
IMPRESSION: No mammographic evidence of malignancy. A result letter of this
screening mammogram will be mailed directly to the patient.

RECOMMENDATION:
Screening mammogram in one year. (Code:[id number])

BI-RADS CATEGORY  1: Negative.

## 2019-03-28 ENCOUNTER — Other Ambulatory Visit: Payer: Self-pay | Admitting: Internal Medicine

## 2019-03-28 NOTE — Telephone Encounter (Signed)
Last filled 06/21/2018 with 1 refill... pt had CPE 01/2019... please advise

## 2019-05-20 ENCOUNTER — Other Ambulatory Visit: Payer: Self-pay | Admitting: Internal Medicine

## 2019-05-25 ENCOUNTER — Other Ambulatory Visit: Payer: Self-pay | Admitting: Internal Medicine

## 2019-05-25 ENCOUNTER — Encounter: Payer: Self-pay | Admitting: Internal Medicine

## 2019-05-25 MED ORDER — PROPRANOLOL HCL 20 MG PO TABS: 20.0000 mg | ORAL_TABLET | Freq: Two times a day (BID) | ORAL | 1 refills | Status: DC

## 2019-05-25 NOTE — Telephone Encounter (Signed)
Last filled 03/29/2019.Marland KitchenMarland KitchenMarland Kitchen please advise

## 2019-05-26 NOTE — Telephone Encounter (Signed)
Last filled 03/29/2019.Marland KitchenMarland KitchenMarland Kitchen please advise

## 2019-07-15 ENCOUNTER — Other Ambulatory Visit: Payer: Self-pay

## 2019-07-15 ENCOUNTER — Encounter: Payer: Self-pay | Admitting: Emergency Medicine

## 2019-07-15 ENCOUNTER — Emergency Department: Payer: Managed Care, Other (non HMO)

## 2019-07-15 ENCOUNTER — Emergency Department
Admission: EM | Admit: 2019-07-15 | Discharge: 2019-07-15 | Disposition: A | Discharge status: Home / Self Care | Payer: Managed Care, Other (non HMO) | Attending: Student | Admitting: Student

## 2019-07-15 DIAGNOSIS — R0789 Other chest pain: Secondary | ICD-10-CM | POA: Insufficient documentation

## 2019-07-15 DIAGNOSIS — Z20822 Contact with and (suspected) exposure to covid-19: Secondary | ICD-10-CM | POA: Insufficient documentation

## 2019-07-15 DIAGNOSIS — I10 Essential (primary) hypertension: Secondary | ICD-10-CM | POA: Diagnosis not present

## 2019-07-15 DIAGNOSIS — Z91040 Latex allergy status: Secondary | ICD-10-CM | POA: Diagnosis not present

## 2019-07-15 DIAGNOSIS — Z87891 Personal history of nicotine dependence: Secondary | ICD-10-CM | POA: Insufficient documentation

## 2019-07-15 DIAGNOSIS — Z79899 Other long term (current) drug therapy: Secondary | ICD-10-CM | POA: Diagnosis not present

## 2019-07-15 DIAGNOSIS — R079 Chest pain, unspecified: Secondary | ICD-10-CM

## 2019-07-15 LAB — HEPATIC FUNCTION PANEL
ALT: 27 U/L (ref 0–44)
AST: 33 U/L (ref 15–41)
Albumin: 4.6 g/dL (ref 3.5–5.0)
Alkaline Phosphatase: 73 U/L (ref 38–126)
Bilirubin, Direct: <0.1 mg/dL (ref 0.0–0.2)
Total Bilirubin: 0.6 mg/dL (ref 0.3–1.2)
Total Protein: 7.5 g/dL (ref 6.5–8.1)

## 2019-07-15 LAB — CBC
HCT: 39 % (ref 36.0–46.0)
Hemoglobin: 13.5 g/dL (ref 12.0–15.0)
MCH: 32.4 pg (ref 26.0–34.0)
MCHC: 34.6 g/dL (ref 30.0–36.0)
MCV: 93.5 fL (ref 80.0–100.0)
Platelets: 154 10*3/uL (ref 150–400)
RBC: 4.17 MIL/uL (ref 3.87–5.11)
RDW: 12.1 % (ref 11.5–15.5)
WBC: 6 10*3/uL (ref 4.0–10.5)
nRBC: 0 % (ref 0.0–0.2)

## 2019-07-15 LAB — BASIC METABOLIC PANEL
Anion gap: 10 (ref 5–15)
BUN: 12 mg/dL (ref 8–23)
CO2: 26 mmol/L (ref 22–32)
Calcium: 9.6 mg/dL (ref 8.9–10.3)
Chloride: 104 mmol/L (ref 98–111)
Creatinine, Ser: 0.62 mg/dL (ref 0.44–1.00)
GFR calc Af Amer: >60 mL/min (ref >60)
GFR calc non Af Amer: >60 mL/min (ref >60)
Glucose, Bld: 90 mg/dL (ref 70–99)
Potassium: 3.4 mmol/L — ABNORMAL LOW (ref 3.5–5.1)
Sodium: 140 mmol/L (ref 135–145)

## 2019-07-15 LAB — LIPASE, BLOOD: Lipase: 26 U/L (ref 11–51)

## 2019-07-15 LAB — TROPONIN I (HIGH SENSITIVITY)
Troponin I (High Sensitivity): 3 ng/L (ref <18)
Troponin I (High Sensitivity): 3 ng/L (ref <18)

## 2019-07-15 LAB — SARS CORONAVIRUS 2 (TAT 6-24 HRS): SARS Coronavirus 2: NEGATIVE

## 2019-07-15 MED ORDER — ALUM & MAG HYDROXIDE-SIMETH 200-200-20 MG/5ML PO SUSP
30.0000 mL | Freq: Once | ORAL | Status: AC
  Administered 2019-07-15: 30 mL via ORAL
  Filled 2019-07-15: qty 30

## 2019-07-15 MED ORDER — FAMOTIDINE 20 MG PO TABS
20.0000 mg | ORAL_TABLET | Freq: Once | ORAL | Status: AC
  Administered 2019-07-15: 20 mg via ORAL
  Filled 2019-07-15: qty 1

## 2019-07-15 MED ORDER — SODIUM CHLORIDE 0.9% FLUSH: 3.0000 mL | Freq: Once | INTRAVENOUS | Status: DC

## 2019-07-15 MED ORDER — SUCRALFATE 1 G PO TABS: 1.0000 g | ORAL_TABLET | Freq: Three times a day (TID) | ORAL | 0 refills | Status: DC

## 2019-07-15 MED ORDER — AZITHROMYCIN 250 MG PO TABS: 250.0000 mg | ORAL_TABLET | Freq: Every day | ORAL | 0 refills | Status: AC

## 2019-07-15 NOTE — Discharge Instructions (Addendum)
Thank you for letting us take care of you in the emergency department today.   Please continue to take any regular, prescribed medications.   New medications we have prescribed:  - Carafate - take as needed for indigestion symptoms - Azithromycin Z-Pak - for treatment of bronchitis - please take if your symptoms persist, especially if you develop cough, fever  Please follow up with: - Your primary care doctor to review your ER visit and follow up on your symptoms.   Please return to the ER for any new or worsening symptoms.

## 2019-07-15 NOTE — ED Triage Notes (Signed)
Says she has chest pain since yesterday.  Says today she started having headache, diarrhea.  Says no aches and no fever.

## 2019-07-15 NOTE — ED Provider Notes (Signed)
[facility] Emergency Department Provider Note  ____________________________________________   First MD Initiated Contact with Patient 07/15/19 1039     (approximate)  I have reviewed the triage vital signs and the nursing notes.  History  Chief Complaint Chest Pain    HPI Krystal Walker is a 64 y.o. female who presents to the ED for chest discomfort.  Patient reports chest pain started yesterday.  Seems to come and go intermittently without any identifiable trigger.  Located to the lower central chest area.  She describes it as an indigestion, tightness type feeling.  No radiation.  No alleviating or aggravating components.  No shortness of breath.  No nausea or vomiting, but did have several episodes of loose stool today.  Denies any fevers or known sick contacts, but she states there have been multiple people at work who tested positive for COVID, though she is unsure if she had any close contact with them.  She does report a history of GERD, but states she has been off of medication for some time due to concerns for side effects.   Past Medical Hx Past Medical History:  Diagnosis Date  . Depression   . GERD (gastroesophageal reflux disease)   . GI bleed   . Hyperlipidemia   . Hypertension   . IBS (irritable bowel syndrome)   . Osteoporosis   . Personal history of radiation therapy 1999  . Raynaud disease   . 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 Arthritis 01/18/2018  . 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 Osteoporosis 04/20/2017  . Frequent headaches 08/13/2015  . Raynaud phenomenon 04/12/2012  . IBS (irritable bowel syndrome) 04/12/2012  . Essential hypertension   . Hyperlipidemia   . Depression   . GERD (gastroesophageal reflux disease)     Past Surgical Hx Past Surgical History:  Procedure Laterality Date    . ABDOMINAL HYSTERECTOMY  2004   partial  . BREAST BIOPSY Left 02-23-12   w/clip - benign  . COLONOSCOPY WITH PROPOFOL N/A 08/11/2016   Procedure: COLONOSCOPY WITH PROPOFOL;  Surgeon: Jonathon Bellows, MD;  Location: [facility] ENDOSCOPY;  Service: Endoscopy;  Laterality: N/A;  . ESOPHAGOGASTRODUODENOSCOPY (EGD) WITH PROPOFOL N/A 08/10/2016   Procedure: ESOPHAGOGASTRODUODENOSCOPY (EGD) WITH PROPOFOL;  Surgeon: Wilford Corner, MD;  Location: [facility] ENDOSCOPY;  Service: Endoscopy;  Laterality: N/A;  . PAROTIDECTOMY Right    adenoid cystic carcinoma  . SALIVARY GLAND SURGERY  1999    Medications Prior to Admission medications   Medication Sig Start Date End Date Taking? Authorizing Provider  acidophilus (RISAQUAD) CAPS capsule Take 1 capsule by mouth daily.   Yes [provider]  butalbital-acetaminophen-caffeine (FIORICET) 50-325-40 MG tablet TAKE 1 TO 2 TABLETS BY MOUTH EVERY 6 HOURS AS NEEDED FOR HEADACHE 05/26/19  Yes Baity, Coralie Keens, NP  Multiple Vitamin (MULTIVITAMIN WITH MINERALS) TABS tablet Take 1 tablet by mouth daily.   Yes [provider]  Omega-3 Fatty Acids (SALMON OIL-1000 PO) Take 2 capsules by mouth every evening.    Yes [provider]  propranolol (INDERAL) 20 MG tablet Take 1 tablet (20 mg total) by mouth 2 (two) times daily. 05/25/19  Yes Jearld Fenton, NP    Allergies Latex, Morphine, Morphine and related, Nickel, and Other  Family Hx Family History  Problem Relation Age of Onset  . Arrhythmia Mother  pacemaker  . Fainting Mother   . Hypertension Mother   . Hyperlipidemia Mother   . Breast cancer Neg Hx     Social Hx Social History   Tobacco Use  . Smoking status: Former Smoker    Packs/day: 1.00    Years: 15.00    Pack years: 15.00    Types: Cigarettes  . Smokeless tobacco: Never Used  . Tobacco comment: quit 1986  Substance Use Topics  . Alcohol use: No    Alcohol/week: 0.0 standard drinks  . Drug use: No     Review of  Systems  Constitutional: Negative for fever, chills. Eyes: Negative for visual changes. ENT: Negative for sore throat. Cardiovascular: Positive for chest pain. Respiratory: Negative for shortness of breath. Gastrointestinal: Positive for diarrhea. Genitourinary: Negative for dysuria. Musculoskeletal: Negative for leg swelling. Skin: Negative for rash. Neurological: Negative for headaches.   Physical Exam  Vital Signs: ED Triage Vitals  Enc Vitals Group     BP 07/15/19 0840 (!) 154/89     Pulse Rate 07/15/19 0840 72     Resp 07/15/19 0840 14     Temp 07/15/19 0840 98.1 F (36.7 C)     Temp Source 07/15/19 0840 Oral     SpO2 07/15/19 0840 100 %     Weight 07/15/19 0841 143 lb (64.9 kg)     Height 07/15/19 0841 5\' 4"  (1.626 m)     Head Circumference --      Peak Flow --      Pain Score 07/15/19 0840 6     Pain Loc --      Pain Edu? --      Excl. in [place]? --     Constitutional: Alert and oriented.  Head: Normocephalic. Atraumatic. Eyes: Conjunctivae clear. Sclera anicteric. Nose: No congestion. No rhinorrhea. Mouth/Throat: Wearing mask.  Neck: No stridor.   Cardiovascular: Normal rate, regular rhythm. Extremities well perfused. Respiratory: Normal respiratory effort.  Lungs CTAB. Gastrointestinal: Soft. Non-tender. Non-distended.  Musculoskeletal: No lower extremity edema. No deformities. Neurologic:  Normal speech and language. No gross focal neurologic deficits are appreciated.  Skin: Skin is warm, dry and intact. No rash noted. Psychiatric: Mood and affect are appropriate for situation.  EKG  Personally reviewed.   Rate: 76 Rhythm: sinus Axis: normal Intervals: WNL Nonspecific ST/T wave findings No acute ischemic changes No STEMI    Radiology  CXR: IMPRESSION:  1. Subtle increase in bilateral pulmonary reticulonodular opacity  since 2019. Consider viral/atypical respiratory infection.  2. Otherwise stable chest with chronic right apical scarring and    large lung volumes.    Procedures  Procedure(s) performed (including critical care):  Procedures   Initial Impression / Assessment and Plan / ED Course  64 y.o. female who presents to the ED for chest discomfort, as above  Ddx: ACS, pulmonary infection, bronchitis, GERD, COVID  Will evaluate with labs, EKG, imaging.  Troponin x 2 negative. EKG w/o evidence of acute ischemia. CXR with possible viral/atypical infection.  No evidence of respiratory distress or hypoxia. As such, patient is stable for discharge with outpatient follow-up.  Will provide Rx for azithromycin in case of atypical respiratory infection as well as Rx for Carafate.  COVID swab performed and pending, patient aware that swab will result in 6 to 24 hours and she will receive a phone call if positive.  Otherwise, advised continued supportive care and outpatient follow-up. She is agreeable w/ plan.    Final Clinical Impression(s) / ED Diagnosis  Final  diagnoses:  Chest pain in adult       Note:  This document was prepared using Dragon voice recognition software and may include unintentional dictation errors.   Lilia Pro., MD 07/15/19 1352

## 2019-07-25 ENCOUNTER — Other Ambulatory Visit: Payer: Self-pay | Admitting: Internal Medicine

## 2019-07-25 NOTE — Telephone Encounter (Signed)
Last filled 05/26/2019... please advise

## 2019-09-13 ENCOUNTER — Other Ambulatory Visit: Payer: Self-pay | Admitting: Internal Medicine

## 2019-09-13 NOTE — Telephone Encounter (Signed)
Name of Medication: Fioricet 50-325-40 mg Name of [facility]: [facility] Last Fill or Written Date and Quantity: # 5 on 07/26/19 Last Office Visit and Type:01/31/19 annual exam Next Office Visit and Type: none scheduled Last Controlled Substance Agreement Date: none noted Last [id number] noted.  Avie Echevaria NP is out of office until 09/19/19.Please advise. Pt left v/m wanting to know status of Fioricet refill requested by [facility] on 09/12/19.

## 2019-09-20 ENCOUNTER — Telehealth: Payer: Self-pay

## 2019-09-20 ENCOUNTER — Other Ambulatory Visit: Payer: Self-pay

## 2019-09-20 DIAGNOSIS — R911 Solitary pulmonary nodule: Secondary | ICD-10-CM

## 2019-09-20 NOTE — Telephone Encounter (Signed)
Patient has been scheduled for a CT chest without contrast at [facility] on 10/24/19 at 9:00 am. She will arrive there by 8:45 am. No prep needed. Patient verbalizes understanding. She will follow up with Dr Genevive Bi the following week.

## 2019-09-20 NOTE — Telephone Encounter (Signed)
Patient is due for a CT Chest w/o contrast and follow up with Dr Genevive Bi in May. Message left for the patient to call back to let us know good day/time to schedule her CT and follow up.

## 2019-10-12 ENCOUNTER — Other Ambulatory Visit: Payer: Self-pay | Admitting: Internal Medicine

## 2019-10-17 ENCOUNTER — Other Ambulatory Visit: Payer: Self-pay | Admitting: Internal Medicine

## 2019-10-17 NOTE — Telephone Encounter (Signed)
Last filled 09-13-19 #60 Last OV 01-31-19 No Future OV [facility]

## 2019-10-17 NOTE — Telephone Encounter (Signed)
Rx faxed to [facility]  

## 2019-10-24 ENCOUNTER — Other Ambulatory Visit: Payer: Self-pay

## 2019-10-24 ENCOUNTER — Ambulatory Visit
Admission: RE | Admit: 2019-10-24 | Discharge: 2019-10-24 | Disposition: A | Discharge status: Home / Self Care | Payer: Managed Care, Other (non HMO) | Source: Ambulatory Visit | Attending: Cardiothoracic Surgery | Admitting: Cardiothoracic Surgery

## 2019-10-24 ENCOUNTER — Ambulatory Visit: Payer: Managed Care, Other (non HMO)

## 2019-10-24 DIAGNOSIS — R911 Solitary pulmonary nodule: Secondary | ICD-10-CM | POA: Diagnosis present

## 2019-10-26 ENCOUNTER — Other Ambulatory Visit: Payer: Self-pay | Admitting: Internal Medicine

## 2019-10-26 NOTE — Telephone Encounter (Signed)
Last filled 07/2018 to take PRN.... please advise

## 2019-10-28 ENCOUNTER — Telehealth: Payer: Self-pay | Admitting: Emergency Medicine

## 2019-10-28 ENCOUNTER — Other Ambulatory Visit: Payer: Self-pay

## 2019-10-28 ENCOUNTER — Encounter: Payer: Self-pay | Admitting: Cardiothoracic Surgery

## 2019-10-28 ENCOUNTER — Ambulatory Visit (INDEPENDENT_AMBULATORY_CARE_PROVIDER_SITE_OTHER): Payer: Managed Care, Other (non HMO) | Admitting: Cardiothoracic Surgery

## 2019-10-28 VITALS — BP 124/79 | HR 87 | Temp 97.0°F | Resp 12 | Wt 148.4 lb

## 2019-10-28 DIAGNOSIS — Z85818 Personal history of malignant neoplasm of other sites of lip, oral cavity, and pharynx: Secondary | ICD-10-CM | POA: Diagnosis not present

## 2019-10-28 DIAGNOSIS — R911 Solitary pulmonary nodule: Secondary | ICD-10-CM | POA: Diagnosis not present

## 2019-10-28 NOTE — Progress Notes (Signed)
Krystal Walker Follow Up Note  Patient ID: Krystal Walker, female   DOB: 09/11/1955, 64 y.o.   MRN: [id number]  HISTORY: Krystal Walker returns today in follow-up of her right upper lobe mass.  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  She had a right upper lobe mass identified on a neck CT and we have been following this for about 2 years now.  She recently had another CT scan which did not reveal any change in the right upper lobe partially calcified 2 cm lesion.  Her only complaint is that she did have an episode of substernal chest pain and went to the emergency room and was diagnosed with reflux.  In addition she has noticed a little bit of swelling on the right side of her neck.  This is in her prior radiation field.    Vitals:   10/28/19 0902  BP: 124/79  Pulse: 87  Resp: 12  Temp: (!) 97 F (36.1 C)  SpO2: 97%     EXAM:  Resp: Lungs are clear bilaterally.  No respiratory distress, normal effort. Heart:  Regular without murmurs Abd:  Abdomen is soft, non distended and non tender. No masses are palpable.  There is no rebound and no guarding.  Neurological: Alert and oriented to person, place, and time. Coordination normal.  Skin: Skin is warm and dry. No rash noted. No diaphoretic. No erythema. No pallor.  Psychiatric: Normal mood and affect. Normal behavior. Judgment and thought content normal.    Palpation of the right neck reveals an irregular vague 1.5 cm mass.  There is some fullness on the right side compared to the left.  I have independently reviewed the patient's CT scan I reviewed those films with her.  I see no change over a 2-year period of the right upper lobe process.  ASSESSMENT: Right upper lobe mass   PLAN:   It has not been 2 years since we have been following the right upper lobe mass and there is been no change in that.  I did not recommend any additional follow-up  at this time.  I would like Dr. Richardson Landry to review her physical findings and get his opinion about the vague right sided neck mass.    Nestor Lewandowsky, MD

## 2019-10-28 NOTE — Patient Instructions (Signed)
We have placed a referral to [facility] their office will contact you to schedule an appointment. If you do not hear from their office call the office back.   Follow up as needed with Dr Genevive Bi. Call the office if you have any questions or concerns.

## 2019-10-28 NOTE — Telephone Encounter (Signed)
Referral sent to [facility] at this time. Encounter note, demo sheet included.   P: [phone] F: [id number]

## 2019-10-31 ENCOUNTER — Telehealth: Payer: Self-pay | Admitting: Emergency Medicine

## 2019-10-31 NOTE — Telephone Encounter (Signed)
[facility] received referral for pt. They called patient to schedule appt with no answer. Left vm to for pt to give their office a call.   I called patient she states she received vm and has been busy but will call them back to schedule appt.

## 2019-11-24 ENCOUNTER — Other Ambulatory Visit: Payer: Self-pay | Admitting: Internal Medicine

## 2019-11-24 NOTE — Telephone Encounter (Signed)
Last filled 10/17/2019... please advise

## 2019-12-28 ENCOUNTER — Other Ambulatory Visit: Payer: Self-pay | Admitting: Internal Medicine

## 2019-12-29 NOTE — Telephone Encounter (Signed)
Last filled 11/24/2019...Marland Kitchen please advise

## 2020-01-23 ENCOUNTER — Other Ambulatory Visit: Payer: Self-pay | Admitting: Internal Medicine

## 2020-01-24 ENCOUNTER — Other Ambulatory Visit: Payer: Self-pay

## 2020-01-24 DIAGNOSIS — Z1231 Encounter for screening mammogram for malignant neoplasm of breast: Secondary | ICD-10-CM

## 2020-01-24 NOTE — Progress Notes (Signed)
Pt needs mammogram order for Krystal Walker to schedule her screening mammogram.  Placed order

## 2020-01-31 ENCOUNTER — Other Ambulatory Visit: Payer: Self-pay | Admitting: Internal Medicine

## 2020-02-01 NOTE — Telephone Encounter (Signed)
Last filled 12/29/2019... please advise--- CPE scheduled for October

## 2020-03-05 ENCOUNTER — Other Ambulatory Visit: Payer: Self-pay | Admitting: Internal Medicine

## 2020-03-07 NOTE — Telephone Encounter (Signed)
Last filled 02/01/2020... please advise

## 2020-03-12 ENCOUNTER — Ambulatory Visit
Admission: RE | Admit: 2020-03-12 | Discharge: 2020-03-12 | Disposition: A | Discharge status: Home / Self Care | Payer: Managed Care, Other (non HMO) | Source: Ambulatory Visit | Attending: Internal Medicine | Admitting: Internal Medicine

## 2020-03-12 ENCOUNTER — Other Ambulatory Visit: Payer: Self-pay

## 2020-03-12 DIAGNOSIS — Z1231 Encounter for screening mammogram for malignant neoplasm of breast: Secondary | ICD-10-CM

## 2020-03-22 ENCOUNTER — Other Ambulatory Visit: Payer: Self-pay

## 2020-03-22 ENCOUNTER — Encounter: Payer: Self-pay | Admitting: Internal Medicine

## 2020-03-22 ENCOUNTER — Ambulatory Visit (INDEPENDENT_AMBULATORY_CARE_PROVIDER_SITE_OTHER): Payer: Managed Care, Other (non HMO) | Admitting: Internal Medicine

## 2020-03-22 VITALS — BP 126/78 | HR 73 | Temp 97.7°F | Ht 64.25 in | Wt 140.0 lb

## 2020-03-22 DIAGNOSIS — M1990 Unspecified osteoarthritis, unspecified site: Secondary | ICD-10-CM

## 2020-03-22 DIAGNOSIS — I10 Essential (primary) hypertension: Secondary | ICD-10-CM | POA: Diagnosis not present

## 2020-03-22 DIAGNOSIS — Z85818 Personal history of malignant neoplasm of other sites of lip, oral cavity, and pharynx: Secondary | ICD-10-CM

## 2020-03-22 DIAGNOSIS — Z0000 Encounter for general adult medical examination without abnormal findings: Secondary | ICD-10-CM | POA: Diagnosis not present

## 2020-03-22 DIAGNOSIS — M816 Localized osteoporosis [Lequesne]: Secondary | ICD-10-CM

## 2020-03-22 DIAGNOSIS — F325 Major depressive disorder, single episode, in full remission: Secondary | ICD-10-CM | POA: Diagnosis not present

## 2020-03-22 DIAGNOSIS — K219 Gastro-esophageal reflux disease without esophagitis: Secondary | ICD-10-CM

## 2020-03-22 DIAGNOSIS — K582 Mixed irritable bowel syndrome: Secondary | ICD-10-CM

## 2020-03-22 DIAGNOSIS — Z23 Encounter for immunization: Secondary | ICD-10-CM | POA: Diagnosis not present

## 2020-03-22 DIAGNOSIS — R519 Headache, unspecified: Secondary | ICD-10-CM | POA: Diagnosis not present

## 2020-03-22 DIAGNOSIS — E782 Mixed hyperlipidemia: Secondary | ICD-10-CM

## 2020-03-22 DIAGNOSIS — I7300 Raynaud's syndrome without gangrene: Secondary | ICD-10-CM

## 2020-03-22 NOTE — Assessment & Plan Note (Signed)
Continue CBD and Tramadol Will monitor

## 2020-03-22 NOTE — Assessment & Plan Note (Signed)
No issues off meds °Will monitor °

## 2020-03-22 NOTE — Assessment & Plan Note (Signed)
Continue Calcium and Vit D today Encouraged daily weightbearing exercise

## 2020-03-22 NOTE — Assessment & Plan Note (Signed)
Continue Propranolol and Fioricet Will monitor

## 2020-03-22 NOTE — Progress Notes (Signed)
Subjective:    Patient ID: Krystal Walker, female    DOB: 29-Jan-1956, 64 y.o.   MRN: [id number]  HPI  Pt presents to the clinic today for her annual exam. She is also due to follow up chronic conditions.   Depression: In remission. She is not taking any antidepressant medications at this time. She does not see a therapist. She denies anxiety, SI/HI.  GERD: Triggered by NSAID's. She no longer takes Omeprazole as she is no longer taking NSAID's. Upper GI From 07/2019 reviewed.  HTN: Her BP today is 126/78. She is taking Propranolol as prescribed. ECG from 22021 reviewed.  IBS: Alternating constipation and diarrhea. Managed on Hyoscyamine, Align, Mylanta, Benefiber and Mirilax as needed. She does not follow with GI  Raynaud's: Currently not an issue. She is not taking any CCB.  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 She no longer follows with oncology.  Osteoporosis: She is taking Calcium and Vit D daily. She gets 30 minutes of weight bearing exercise daily.  Frequent Headaches: Managed on Propranolol and Fioricet. She follows with Dr. Melrose Nakayama.  OA: Mainly in her takes. She takes CBD oil and Tramadol as needed with good relief of sypmtoms.  HLD: Her last LDL was 153. She is not taking any cholesterol lowering medication at this time. She tries to consume a low fat diet.   Flu: 03/2019 Tetanus: 06/2012 Covid: Moderna Zostovax: 04/2016 Shingrix: 04/2019,10/2019 Pap Smear: hysterectomy Mammogram: 03/2020 Bone Density: 02/2019 Colon Screening: 08/2016 Vision Screening: annually Dentist: biannually  Diet: She does eat meat. She consumes fruits and veggies daily. She rarely eats fried foods. She drinks mostly water, coffee. Exercise: Walking  Review of Systems      Past Medical History:  Diagnosis Date  . Depression   . GERD (gastroesophageal reflux disease)   . GI bleed   . Hyperlipidemia   . Hypertension   . IBS (irritable bowel syndrome)   . Osteoporosis    . Personal history of radiation therapy 1999  . Raynaud disease   . 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 butalbital-acetaminophen-caffeine (FIORICET) 50-325-40 MG tablet TAKE 1-2 TABLETS EVERY 6 HOURS AS NEEDEDFOR HEADACHE 60 tablet 0  . acidophilus (RISAQUAD) CAPS capsule Take 1 capsule by mouth daily.    . Multiple Vitamin (MULTIVITAMIN WITH MINERALS) TABS tablet Take 1 tablet by mouth daily.    . Omega-3 Fatty Acids (SALMON OIL-1000 PO) Take 2 capsules by mouth every evening.     . propranolol (INDERAL) 20 MG tablet Take 1 tablet (20 mg total) by mouth 2 (two) times daily. SCHEDULE PHYSICAL FOR Aug 2021 180 tablet 0  . sucralfate (CARAFATE) 1 g tablet Take 1 tablet (1 g total) by mouth 3 (three) times daily before meals for 7 days. 21 tablet 0  . traMADol (ULTRAM) 50 MG tablet TAKE ONE TABLET BY MOUTH EVERY 6 HOURS AS NEEDED FOR MODERATE PAIN 30 tablet 0   No current facility-administered medications for this visit.    Allergies  Allergen Reactions  . Latex Hives and Itching  . Morphine Nausea And Vomiting  . Morphine And Related Nausea And Vomiting  . Nickel Other (See Comments)  . Other     Family History  Problem Relation Age of Onset  . Arrhythmia Mother        pacemaker  . Fainting Mother   . Hypertension  Mother   . Hyperlipidemia Mother   . Breast cancer Neg Hx     Social History   Socioeconomic History  . Marital status: Single    Spouse name: Not on file  . Number of children: Not on file  . Years of education: Not on file  . Highest education level: Not on file  Occupational History  . Not on file  Tobacco Use  . Smoking status: Former Smoker    Packs/day: 1.00    Years: 15.00    Pack years: 15.00    Types: Cigarettes  . Smokeless tobacco: Never Used  . Tobacco comment: quit 1986  Vaping Use  . Vaping Use: Never used  Substance  and Sexual Activity  . Alcohol use: No    Alcohol/week: 0.0 standard drinks  . Drug use: No  . Sexual activity: Not Currently  Other Topics Concern  . Not on file  Social History Narrative  . Not on file   Social Determinants of Health   Financial Resource Strain:   . Difficulty of Paying Living Expenses: Not on file  Food Insecurity:   . Worried About Charity fundraiser in the Last Year: Not on file  . Ran Out of Food in the Last Year: Not on file  Transportation Needs:   . Lack of Transportation (Medical): Not on file  . Lack of Transportation (Non-Medical): Not on file  Physical Activity:   . Days of Exercise per Week: Not on file  . Minutes of Exercise per Session: Not on file  Stress:   . Feeling of Stress : Not on file  Social Connections:   . Frequency of Communication with Friends and Family: Not on file  . Frequency of Social Gatherings with Friends and Family: Not on file  . Attends Religious Services: Not on file  . Active Member of Clubs or Organizations: Not on file  . Attends Archivist Meetings: Not on file  . Marital Status: Not on file  Intimate Partner Violence:   . Fear of Current or Ex-Partner: Not on file  . Emotionally Abused: Not on file  . Physically Abused: Not on file  . Sexually Abused: Not on file     Constitutional: Pt reports intermittent headaches. Denies fever, malaise, fatigue, or abrupt weight changes.  HEENT: Denies eye pain, eye redness, ear pain, ringing in the ears, wax buildup, runny nose, nasal congestion, bloody nose, or sore throat. Respiratory: Denies difficulty breathing, shortness of breath, cough or sputum production.   Cardiovascular: Denies chest pain, chest tightness, palpitations or swelling in the hands or feet.  Gastrointestinal: Pt reports intermittent constipation and diarrhea. Denies abdominal pain, bloating, or blood in the stool.  GU: Denies urgency, frequency, pain with urination, burning sensation,  blood in urine, odor or discharge. Musculoskeletal: Denies decrease in range of motion, difficulty with gait, muscle pain or joint pain and swelling.  Skin: Denies redness, rashes, lesions or ulcercations.  Neurological: Denies dizziness, difficulty with memory, difficulty with speech or problems with balance and coordination.  Psych: Pt has a history of depression. Denies anxiety, SI/HI.  No other specific complaints in a complete review of systems (except as listed in HPI above).  Objective:   Physical Exam  There were no vitals taken for this visit. Wt Readings from Last 3 Encounters:  10/28/19 148 lb 6.4 oz (67.3 kg)  07/15/19 143 lb (64.9 kg)  01/31/19 148 lb (67.1 kg)    General: Appears her stated age, well  developed, well nourished in NAD. Skin: Warm, dry and intact. No rashes noted. HEENT: Head: normal shape and size; Eyes: sclera white, no icterus, conjunctiva pink, PERRLA and EOMs intact;  Neck:  Neck supple, trachea midline. No masses, lumps or thyromegaly present.  Cardiovascular: Normal rate and rhythm. S1,S2 noted.  No murmur, rubs or gallops noted. No JVD or BLE edema. No carotid bruits noted. Pulmonary/Chest: Normal effort and positive vesicular breath sounds. No respiratory distress. No wheezes, rales or ronchi noted.  Abdomen: Soft and nontender. Normal bowel sounds. No distention or masses noted. Liver, spleen and kidneys non palpable. Musculoskeletal: Strength 5/5 BUE/BLE. No difficulty with gait.  Neurological: Alert and oriented. Cranial nerves II-XII grossly intact. Coordination normal.  Psychiatric: Mood and affect normal. Behavior is normal. Judgment and thought content normal.    BMET    Component Value Date/Time   NA 140 07/15/2019 0904   NA 141 01/31/2019 1014   K 3.4 (L) 07/15/2019 0904   CL 104 07/15/2019 0904   CO2 26 07/15/2019 0904   GLUCOSE 90 07/15/2019 0904   BUN 12 07/15/2019 0904   BUN 12 01/31/2019 1014   CREATININE 0.62 07/15/2019 0904    CALCIUM 9.6 07/15/2019 0904   GFRNONAA >60 07/15/2019 0904   GFRAA >60 07/15/2019 0904    Lipid Panel     Component Value Date/Time   CHOL 242 (H) 01/31/2019 1014   TRIG 89 01/31/2019 1014   HDL 74 01/31/2019 1014   CHOLHDL 3.3 01/31/2019 1014   LDLCALC 150 (H) 01/31/2019 1014    CBC    Component Value Date/Time   WBC 6.0 07/15/2019 0904   RBC 4.17 07/15/2019 0904   HGB 13.5 07/15/2019 0904   HGB 14.3 01/31/2019 1014   HCT 39.0 07/15/2019 0904   HCT 40.4 01/31/2019 1014   PLT 154 07/15/2019 0904   PLT 174 01/31/2019 1014   MCV 93.5 07/15/2019 0904   MCV 94 01/31/2019 1014   MCH 32.4 07/15/2019 0904   MCHC 34.6 07/15/2019 0904   RDW 12.1 07/15/2019 0904   RDW 12.1 01/31/2019 1014   LYMPHSABS 1.3 01/12/2017 0904   MONOABS 0.7 11/20/2016 1046   EOSABS 0.1 01/12/2017 0904   BASOSABS 0.0 01/12/2017 0904    Hgb A1C Lab Results  Component Value Date   HGBA1C 5.4 01/31/2019            Assessment & Plan:   Preventative Health Maintneance:  Flu shot today Tetanus UTD Covid UTD Zosotovax UTD Shingrix UTD She no longer needs pap smears Mammogram UTD Bone density UTD Colon screening UTD Encouraged her to consume a balanced diet and exercise regimen Advised her to see an eye doctor and dentist annually Will check CBC, CMET, and Vit D today  RTC in 1 year, sooner if needed Webb Silversmith, NP This visit occurred during the SARS-CoV-2 public health emergency.  Safety protocols were in place, including screening questions prior to the visit, additional usage of staff PPE, and extensive cleaning of exam room while observing appropriate contact time as indicated for disinfecting solutions.

## 2020-03-22 NOTE — Assessment & Plan Note (Signed)
CMET and Lipid profile today Encouraged her to consume a low fat diet 

## 2020-03-22 NOTE — Assessment & Plan Note (Signed)
Stable on Propranolol Will monitor

## 2020-03-22 NOTE — Patient Instructions (Signed)

## 2020-03-22 NOTE — Assessment & Plan Note (Signed)
In remission.

## 2020-03-22 NOTE — Assessment & Plan Note (Signed)
Continue Hyoscyamine, Align, Mylanta, Benefiber and Mirilax.

## 2020-03-22 NOTE — Assessment & Plan Note (Signed)
No issues off CCB Will monitor

## 2020-03-22 NOTE — Assessment & Plan Note (Signed)
Support offered Will monitor

## 2020-03-23 LAB — COMPREHENSIVE METABOLIC PANEL
ALT: 23 IU/L (ref 0–32)
AST: 28 IU/L (ref 0–40)
Albumin/Globulin Ratio: 1.7 (ref 1.2–2.2)
Albumin: 4.4 g/dL (ref 3.8–4.8)
Alkaline Phosphatase: 80 IU/L (ref 44–121)
BUN/Creatinine Ratio: 20 (ref 12–28)
BUN: 17 mg/dL (ref 8–27)
Bilirubin Total: <0.2 mg/dL (ref 0.0–1.2)
CO2: 23 mmol/L (ref 20–29)
Calcium: 9.8 mg/dL (ref 8.7–10.3)
Chloride: 103 mmol/L (ref 96–106)
Creatinine, Ser: 0.84 mg/dL (ref 0.57–1.00)
GFR calc Af Amer: 85 mL/min/{1.73_m2} (ref >59)
GFR calc non Af Amer: 74 mL/min/{1.73_m2} (ref >59)
Globulin, Total: 2.6 g/dL (ref 1.5–4.5)
Glucose: 109 mg/dL — ABNORMAL HIGH (ref 65–99)
Potassium: 4.6 mmol/L (ref 3.5–5.2)
Sodium: 141 mmol/L (ref 134–144)
Total Protein: 7 g/dL (ref 6.0–8.5)

## 2020-03-23 LAB — CBC
Hematocrit: 38.6 % (ref 34.0–46.6)
Hemoglobin: 13.6 g/dL (ref 11.1–15.9)
MCH: 33.6 pg — ABNORMAL HIGH (ref 26.6–33.0)
MCHC: 35.2 g/dL (ref 31.5–35.7)
MCV: 95 fL (ref 79–97)
Platelets: 182 10*3/uL (ref 150–450)
RBC: 4.05 x10E6/uL (ref 3.77–5.28)
RDW: 11.8 % (ref 11.7–15.4)
WBC: 3.5 10*3/uL (ref 3.4–10.8)

## 2020-03-23 LAB — VITAMIN D 25 HYDROXY (VIT D DEFICIENCY, FRACTURES): Vit D, 25-Hydroxy: 37.5 ng/mL (ref 30.0–100.0)

## 2020-04-10 ENCOUNTER — Other Ambulatory Visit: Payer: Self-pay | Admitting: Internal Medicine

## 2020-04-11 NOTE — Telephone Encounter (Signed)
Last filled 03/07/2020...Marland Kitchen please advise

## 2020-05-14 ENCOUNTER — Other Ambulatory Visit: Payer: Self-pay | Admitting: Internal Medicine

## 2020-05-14 NOTE — Telephone Encounter (Signed)
Last filled 04/11/2020... please advise

## 2020-06-12 ENCOUNTER — Ambulatory Visit
Admission: EM | Admit: 2020-06-12 | Discharge: 2020-06-12 | Disposition: A | Discharge status: Home / Self Care | Payer: Managed Care, Other (non HMO)

## 2020-06-13 ENCOUNTER — Other Ambulatory Visit: Payer: Self-pay | Admitting: Internal Medicine

## 2020-07-12 ENCOUNTER — Other Ambulatory Visit: Payer: Self-pay | Admitting: Internal Medicine

## 2020-07-13 NOTE — Telephone Encounter (Signed)
Last filled 05/15/2020 with 1 refill... please advise

## 2020-08-13 ENCOUNTER — Other Ambulatory Visit: Payer: Self-pay | Admitting: Internal Medicine

## 2020-08-14 NOTE — Telephone Encounter (Signed)
Last filled 07/13/2020...Marland Kitchen please advise

## 2020-08-24 ENCOUNTER — Telehealth: Payer: Self-pay

## 2020-08-24 NOTE — Telephone Encounter (Signed)
Error

## 2020-08-30 ENCOUNTER — Ambulatory Visit (INDEPENDENT_AMBULATORY_CARE_PROVIDER_SITE_OTHER): Payer: Managed Care, Other (non HMO)

## 2020-08-30 ENCOUNTER — Other Ambulatory Visit: Payer: Self-pay

## 2020-08-30 DIAGNOSIS — Z23 Encounter for immunization: Secondary | ICD-10-CM | POA: Diagnosis not present

## 2020-08-30 NOTE — Progress Notes (Signed)
Per orders of Webb Silversmith, NP, injection of Prevnar 13 given by Randall An. Patient tolerated injection well.

## 2020-09-20 ENCOUNTER — Ambulatory Visit: Payer: Managed Care, Other (non HMO)

## 2020-10-11 ENCOUNTER — Other Ambulatory Visit: Payer: Self-pay | Admitting: Internal Medicine

## 2020-10-12 NOTE — Telephone Encounter (Signed)
Last filled 08/15/2020 with 1 refill-- has upcoming TOC appt

## 2020-10-15 ENCOUNTER — Other Ambulatory Visit: Payer: Self-pay

## 2020-10-22 ENCOUNTER — Ambulatory Visit: Payer: Managed Care, Other (non HMO) | Admitting: Adult Health

## 2020-10-22 ENCOUNTER — Other Ambulatory Visit: Payer: Self-pay

## 2020-10-22 ENCOUNTER — Encounter: Payer: Self-pay | Admitting: Adult Health

## 2020-10-22 VITALS — BP 130/90 | HR 72 | Temp 98.1°F | Ht 64.96 in | Wt 143.2 lb

## 2020-10-22 DIAGNOSIS — Z85818 Personal history of malignant neoplasm of other sites of lip, oral cavity, and pharynx: Secondary | ICD-10-CM | POA: Diagnosis not present

## 2020-10-22 DIAGNOSIS — E785 Hyperlipidemia, unspecified: Secondary | ICD-10-CM | POA: Diagnosis not present

## 2020-10-22 DIAGNOSIS — F419 Anxiety disorder, unspecified: Secondary | ICD-10-CM

## 2020-10-22 DIAGNOSIS — K582 Mixed irritable bowel syndrome: Secondary | ICD-10-CM

## 2020-10-22 MED ORDER — ESCITALOPRAM OXALATE 5 MG PO TABS: 5.0000 mg | ORAL_TABLET | Freq: Every day | ORAL | 0 refills | Status: DC

## 2020-10-22 NOTE — Patient Instructions (Addendum)
LEXAPRO    Discussed known black box warning for anti depression/ anxiety medication. Need to report any behavioral changes right, if any homicidal or suicidal thoughts or ideas seek medical attention right away. Call 911.    Health Maintenance, Female Adopting a healthy lifestyle and getting preventive care are important in promoting health and wellness. Ask your health care provider about:  The right schedule for you to have regular tests and exams.  Things you can do on your own to prevent diseases and keep yourself healthy. What should I know about diet, weight, and exercise? Eat a healthy diet  Eat a diet that includes plenty of vegetables, fruits, low-fat dairy products, and lean protein.  Do not eat a lot of foods that are high in solid fats, added sugars, or sodium.   Maintain a healthy weight Body mass index (BMI) is used to identify weight problems. It estimates body fat based on height and weight. Your health care provider can help determine your BMI and help you achieve or maintain a healthy weight. Get regular exercise Get regular exercise. This is one of the most important things you can do for your health. Most adults should:  Exercise for at least 150 minutes each week. The exercise should increase your heart rate and make you sweat (moderate-intensity exercise).  Do strengthening exercises at least twice a week. This is in addition to the moderate-intensity exercise.  Spend less time sitting. Even light physical activity can be beneficial. Watch cholesterol and blood lipids Have your blood tested for lipids and cholesterol at 66 years of age, then have this test every 5 years. Have your cholesterol levels checked more often if:  Your lipid or cholesterol levels are high.  You are older than 66 years of age.  You are at high risk for heart disease. What should I know about cancer screening? Depending on your health history and family history, you may need to have  cancer screening at various ages. This may include screening for:  Breast cancer.  Cervical cancer.  Colorectal cancer.  Skin cancer.  Lung cancer. What should I know about heart disease, diabetes, and high blood pressure? Blood pressure and heart disease  High blood pressure causes heart disease and increases the risk of stroke. This is more likely to develop in people who have high blood pressure readings, are of African descent, or are overweight.  Have your blood pressure checked: ? Every 3-5 years if you are 67-74 years of age. ? Every year if you are 30 years old or older. Diabetes Have regular diabetes screenings. This checks your fasting blood sugar level. Have the screening done:  Once every three years after age 9 if you are at a normal weight and have a low risk for diabetes.  More often and at a younger age if you are overweight or have a high risk for diabetes. What should I know about preventing infection? Hepatitis B If you have a higher risk for hepatitis B, you should be screened for this virus. Talk with your health care provider to find out if you are at risk for hepatitis B infection. Hepatitis C Testing is recommended for:  Everyone born from [birth year] through 1966.  Anyone with known risk factors for hepatitis C. Sexually transmitted infections (STIs)  Get screened for STIs, including gonorrhea and chlamydia, if: ? You are sexually active and are younger than 66 years of age. ? You are older than 66 years of age and your health care  provider tells you that you are at risk for this type of infection. ? Your sexual activity has changed since you were last screened, and you are at increased risk for chlamydia or gonorrhea. Ask your health care provider if you are at risk.  Ask your health care provider about whether you are at high risk for HIV. Your health care provider may recommend a prescription medicine to help prevent HIV infection. If you choose to take  medicine to prevent HIV, you should first get tested for HIV. You should then be tested every 3 months for as long as you are taking the medicine. Pregnancy  If you are about to stop having your period (premenopausal) and you may become pregnant, seek counseling before you get pregnant.  Take 400 to 800 micrograms (mcg) of folic acid every day if you become pregnant.  Ask for birth control (contraception) if you want to prevent pregnancy. Osteoporosis and menopause Osteoporosis is a disease in which the bones lose minerals and strength with aging. This can result in bone fractures. If you are 55 years old or older, or if you are at risk for osteoporosis and fractures, ask your health care provider if you should:  Be screened for bone loss.  Take a calcium or vitamin D supplement to lower your risk of fractures.  Be given hormone replacement therapy (HRT) to treat symptoms of menopause. Follow these instructions at home: Lifestyle  Do not use any products that contain nicotine or tobacco, such as cigarettes, e-cigarettes, and chewing tobacco. If you need help quitting, ask your health care provider.  Do not use street drugs.  Do not share needles.  Ask your health care provider for help if you need support or information about quitting drugs. Alcohol use  Do not drink alcohol if: ? Your health care provider tells you not to drink. ? You are pregnant, may be pregnant, or are planning to become pregnant.  If you drink alcohol: ? Limit how much you use to 0-1 drink a day. ? Limit intake if you are breastfeeding.  Be aware of how much alcohol is in your drink. In the [place] one drink equals one 12 oz bottle of beer (355 mL), one 5 oz glass of wine (148 mL), or one 1 oz glass of hard liquor (44 mL). General instructions  Schedule regular health, dental, and eye exams.  Stay current with your vaccines.  Tell your health care provider if: ? You often feel depressed. ? You have  ever been abused or do not feel safe at home. Summary  Adopting a healthy lifestyle and getting preventive care are important in promoting health and wellness.  Follow your health care provider's instructions about healthy diet, exercising, and getting tested or screened for diseases.  Follow your health care provider's instructions on monitoring your cholesterol and blood pressure. This information is not intended to replace advice given to you by your health care provider. Make sure you discuss any questions you have with your health care provider. Document Revised: 05/19/2018 Document Reviewed: 05/19/2018 Elsevier Patient Education  2021 [place]. Escitalopram Tablets What is this medicine? ESCITALOPRAM (es sye TAL oh pram) is used to treat depression and certain types of anxiety. This medicine may be used for other purposes; ask your health care provider or pharmacist if you have questions. COMMON BRAND NAME(S): Lexapro What should I tell my health care provider before I take this medicine? They need to know if you have any of these conditions:  bipolar disorder or a family history of bipolar disorder  diabetes  glaucoma  heart disease  kidney or liver disease  receiving electroconvulsive therapy  seizures (convulsions)  suicidal thoughts, plans, or attempt by you or a family member  an unusual or allergic reaction to escitalopram, the related drug citalopram, other medicines, foods, dyes, or preservatives  pregnant or trying to become pregnant  breast-feeding How should I use this medicine? Take this medicine by mouth with a glass of water. Follow the directions on the prescription label. You can take it with or without food. If it upsets your stomach, take it with food. Take your medicine at regular intervals. Do not take it more often than directed. Do not stop taking this medicine suddenly except upon the advice of your doctor. Stopping this medicine too quickly  may cause serious side effects or your condition may worsen. A special MedGuide will be given to you by the pharmacist with each prescription and refill. Be sure to read this information carefully each time. Talk to your pediatrician regarding the use of this medicine in children. Special care may be needed. Overdosage: If you think you have taken too much of this medicine contact a poison control center or emergency room at once. NOTE: This medicine is only for you. Do not share this medicine with others. What if I miss a dose? If you miss a dose, take it as soon as you can. If it is almost time for your next dose, take only that dose. Do not take double or extra doses. What may interact with this medicine? Do not take this medicine with any of the following medications:  certain medicines for fungal infections like fluconazole, itraconazole, ketoconazole, posaconazole, voriconazole  cisapride  citalopram  dronedarone  linezolid  MAOIs like Carbex, Eldepryl, Marplan, Nardil, and Parnate  methylene blue (injected into a vein)  pimozide  thioridazine This medicine may also interact with the following medications:  alcohol  amphetamines  aspirin and aspirin-like medicines  carbamazepine  certain medicines for depression, anxiety, or psychotic disturbances  certain medicines for migraine headache like almotriptan, eletriptan, frovatriptan, naratriptan, rizatriptan, sumatriptan, zolmitriptan  certain medicines for sleep  certain medicines that treat or prevent blood clots like warfarin, enoxaparin, dalteparin  cimetidine  diuretics  dofetilide  fentanyl  furazolidone  isoniazid  lithium  metoprolol  NSAIDs, medicines for pain and inflammation, like ibuprofen or naproxen  other medicines that prolong the QT interval (cause an abnormal heart rhythm)  procarbazine  rasagiline  supplements like St. John's wort, kava kava,  valerian  tramadol  tryptophan  ziprasidone This list may not describe all possible interactions. Give your health care provider a list of all the medicines, herbs, non-prescription drugs, or dietary supplements you use. Also tell them if you smoke, drink alcohol, or use illegal drugs. Some items may interact with your medicine. What should I watch for while using this medicine? Tell your doctor if your symptoms do not get better or if they get worse. Visit your doctor or health care professional for regular checks on your progress. Because it may take several weeks to see the full effects of this medicine, it is important to continue your treatment as prescribed by your doctor. Patients and their families should watch out for new or worsening thoughts of suicide or depression. Also watch out for sudden changes in feelings such as feeling anxious, agitated, panicky, irritable, hostile, aggressive, impulsive, severely restless, overly excited and hyperactive, or not being able to sleep.  If this happens, especially at the beginning of treatment or after a change in dose, call your health care professional. Dennis Bast may get drowsy or dizzy. Do not drive, use machinery, or do anything that needs mental alertness until you know how this medicine affects you. Do not stand or sit up quickly, especially if you are an older patient. This reduces the risk of dizzy or fainting spells. Alcohol may interfere with the effect of this medicine. Avoid alcoholic drinks. Your mouth may get dry. Chewing sugarless gum or sucking hard candy, and drinking plenty of water may help. Contact your doctor if the problem does not go away or is severe. What side effects may I notice from receiving this medicine? Side effects that you should report to your doctor or health care professional as soon as possible:  allergic reactions like skin rash, itching or hives, swelling of the face, lips, or tongue  anxious  black, tarry  stools  changes in vision  confusion  elevated mood, decreased need for sleep, racing thoughts, impulsive behavior  eye pain  fast, irregular heartbeat  feeling faint or lightheaded, falls  feeling agitated, angry, or irritable  hallucination, loss of contact with reality  loss of balance or coordination  loss of memory  painful or prolonged erections  restlessness, pacing, inability to keep still  seizures  stiff muscles  suicidal thoughts or other mood changes  trouble sleeping  unusual bleeding or bruising  unusually weak or tired  vomiting Side effects that usually do not require medical attention (report to your doctor or health care professional if they continue or are bothersome):  changes in appetite  change in sex drive or performance  headache  increased sweating  indigestion, nausea  tremors This list may not describe all possible side effects. Call your doctor for medical advice about side effects. You may report side effects to FDA at 1-800-FDA-1088. Where should I keep my medicine? Keep out of reach of children. Store at room temperature between 15 and 30 degrees C (59 and 86 degrees F). Throw away any unused medicine after the expiration date. NOTE: This sheet is a summary. It may not cover all possible information. If you have questions about this medicine, talk to your doctor, pharmacist, or health care provider.  2021 Elsevier/Gold Standard (2020-04-16 09:53:34)

## 2020-10-22 NOTE — Progress Notes (Addendum)
New Patient Office Visit  Subjective:  Patient ID: Krystal Walker, female    DOB: March 21, 1956  Age: 65 y.o. MRN: [id number]  CC:  Chief Complaint  Patient presents with  . Establish Care  . Referral    Pt wants a referral to a cancer specialist to monitor her neck.    HPI Krystal Walker presents for new patient care transfer from  Webb Silversmith NP at [place].  She is having anxiety in regards to an upcoming change in her life that is going to change her life. She is also thinking of retirement in about 10 months. She is reporting it is not a bad thing that is coming in her life but does say she may get financial gain with property sale.  She lives at home but does not feel alone she is strong in her faith.   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 She has since noticed a lump in the past  Year on her right side of neck, this was noticed over a year ago.  She saw Dr. Genevive Bi and also found " spots on her right lung" was told may be scar tissue.  She wants to be seen by Oncology.   She is on propanolol for hypertension.   Migraines are controlled on Fioricet.   Tramadol she takes for spurs on her spine takes occasionally and this is mostly when she is sitting.   History of upper GI bleed over 2 years ago unknown cause seen Dr. Vicente Males GI colonoscopy and endoscopy. Denies any bleeding or rectal pain.   Patient  denies any fever, body aches,chills, rash, chest pain, shortness of breath, nausea, vomiting, or diarrhea.  Denies dizziness, lightheadedness, pre syncopal or syncopal episodes.    Past Medical History:  Diagnosis Date  . Depression   . GERD (gastroesophageal reflux disease)   . GI bleed   . Hyperlipidemia   . Hypertension   . IBS (irritable bowel syndrome)   . Osteoporosis   . Personal history of radiation therapy 1999  . Raynaud disease   . 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 ABDOMINAL HYSTERECTOMY  2004   partial  . BREAST BIOPSY Left 02-23-12   w/clip - benign  . COLONOSCOPY WITH PROPOFOL N/A 08/11/2016   Procedure: COLONOSCOPY WITH PROPOFOL;  Surgeon: Jonathon Bellows, MD;  Location: [facility] ENDOSCOPY;  Service: Endoscopy;  Laterality: N/A;  . ESOPHAGOGASTRODUODENOSCOPY (EGD) WITH PROPOFOL N/A 08/10/2016   Procedure: ESOPHAGOGASTRODUODENOSCOPY (EGD) WITH PROPOFOL;  Surgeon: Wilford Corner, MD;  Location: [facility];  Service: Endoscopy;  Laterality: N/A;  . PAROTIDECTOMY Right    adenoid cystic carcinoma  . SALIVARY GLAND SURGERY  1999    Family History  Problem Relation Age of Onset  . Arrhythmia Mother        pacemaker  . Fainting Mother   . Hypertension Mother   . Hyperlipidemia Mother   . Breast cancer Neg Hx     Social History   Socioeconomic History  . Marital status: Single    Spouse name: Not on file  . Number of children: Not on file  . Years of education: Not on file  . Highest education level: Not on file  Occupational History  . Not on file  Tobacco  Use  . Smoking status: Former Smoker    Packs/day: 1.00    Years: 15.00    Pack years: 15.00    Types: Cigarettes  . Smokeless tobacco: Never Used  . Tobacco comment: quit 1986  Vaping Use  . Vaping Use: Never used  Substance and Sexual Activity  . Alcohol use: No    Alcohol/week: 0.0 standard drinks  . Drug use: No  . Sexual activity: Not Currently  Other Topics Concern  . Not on file  Social History Narrative  . Not on file   Social Determinants of Health   Financial Resource Strain: Not on file  Food Insecurity: Not on file  Transportation Needs: Not on file  Physical Activity: Not on file  Stress: Not on file  Social Connections: Not on file  Intimate Partner Violence: Not on file    ROS Review of Systems  Constitutional: Positive for fatigue.  Negative for activity change, appetite change, chills, diaphoresis, fever and unexpected weight change.  HENT: Negative.   Eyes: Negative.   Respiratory: Negative.   Cardiovascular: Negative.   Gastrointestinal: Positive for constipation and diarrhea. Negative for abdominal distention, abdominal pain, anal bleeding, blood in stool, nausea, rectal pain and vomiting.       Chronic IBS not worsening has seen Dr. Vicente Males. Denies any worsening symptoms.   Genitourinary: Negative.   Musculoskeletal: Negative.   Skin: Negative.   Neurological: Negative.   Hematological: Positive for adenopathy.  Psychiatric/Behavioral: Negative for agitation, behavioral problems, confusion, decreased concentration, dysphoric mood, hallucinations, self-injury, sleep disturbance and suicidal ideas. The patient is nervous/anxious. The patient is not hyperactive.     Objective:   Today's Vitals: BP 130/90 (BP Location: Left Arm, Patient Position: Sitting)   Pulse 72   Temp 98.1 F (36.7 C)   Ht 5' 4.96" (1.65 m)   Wt 143 lb 3.2 oz (65 kg)   SpO2 98%   BMI 23.86 kg/m   Physical Exam Vitals reviewed.  Constitutional:      General: She is not in acute distress.    Appearance: Normal appearance. She is well-developed. She is not ill-appearing, toxic-appearing or diaphoretic.     Interventions: She is not intubated. HENT:     Head: Normocephalic and atraumatic.     Right Ear: External ear normal.     Left Ear: External ear normal.     Nose: Nose normal.     Mouth/Throat:     Pharynx: No oropharyngeal exudate.  Eyes:     General: Lids are normal. No scleral icterus.       Right eye: No discharge.        Left eye: No discharge.     Conjunctiva/sclera: Conjunctivae normal.     Right eye: Right conjunctiva is not injected. No exudate or hemorrhage.    Left eye: Left conjunctiva is not injected. No exudate or hemorrhage.    Pupils: Pupils are equal, round, and reactive to light.  Neck:     Thyroid: No  thyroid mass or thyromegaly.     Vascular: Normal carotid pulses. No carotid bruit, hepatojugular reflux or JVD.     Trachea: Trachea and phonation normal. No tracheal tenderness or tracheal deviation.     Meningeal: Brudzinski's sign and Kernig's sign absent.  Cardiovascular:     Rate and Rhythm: Normal rate and regular rhythm.     Pulses: Normal pulses.          Radial pulses are 2+ on the right side and 2+  on the left side.       Dorsalis pedis pulses are 2+ on the right side and 2+ on the left side.       Posterior tibial pulses are 2+ on the right side and 2+ on the left side.     Heart sounds: Normal heart sounds, S1 normal and S2 normal. Heart sounds not distant. No murmur heard. No friction rub. No gallop.   Pulmonary:     Effort: Pulmonary effort is normal. No tachypnea, bradypnea, accessory muscle usage or respiratory distress. She is not intubated.     Breath sounds: Normal breath sounds. No stridor. No wheezing, rhonchi or rales.  Chest:     Chest wall: No tenderness.  Breasts:     Right: No supraclavicular adenopathy.     Left: No supraclavicular adenopathy.    Abdominal:     General: Bowel sounds are normal. There is no distension or abdominal bruit.     Palpations: Abdomen is soft. There is no shifting dullness, fluid wave, hepatomegaly, splenomegaly, mass or pulsatile mass.     Tenderness: There is no abdominal tenderness. There is no right CVA tenderness, left CVA tenderness, guarding or rebound.     Hernia: No hernia is present.  Musculoskeletal:        General: No tenderness or deformity. Normal range of motion.     Cervical back: Full passive range of motion without pain, normal range of motion and neck supple. No edema, erythema, rigidity or tenderness. No spinous process tenderness or muscular tenderness. Normal range of motion.  Lymphadenopathy:     Head:     Right side of head: No submental, submandibular, tonsillar, preauricular, posterior auricular or  occipital adenopathy.     Left side of head: No submental, submandibular, tonsillar, preauricular, posterior auricular or occipital adenopathy.     Cervical: Cervical adenopathy (right neck ) present.     Right cervical: Superficial cervical adenopathy and deep cervical adenopathy present. No posterior cervical adenopathy.    Left cervical: No superficial, deep or posterior cervical adenopathy.     Upper Body:     Right upper body: No supraclavicular or pectoral adenopathy.     Left upper body: No supraclavicular or pectoral adenopathy.  Skin:    General: Skin is warm and dry.     Coloration: Skin is not pale.     Findings: No abrasion, bruising, burn, ecchymosis, erythema, lesion, petechiae or rash.     Nails: There is no clubbing.  Neurological:     Mental Status: She is alert and oriented to person, place, and time.     GCS: GCS eye subscore is 4. GCS verbal subscore is 5. GCS motor subscore is 6.     Cranial Nerves: No cranial nerve deficit.     Sensory: No sensory deficit.     Motor: No weakness, tremor, atrophy, abnormal muscle tone or seizure activity.     Coordination: Coordination normal.     Gait: Gait normal.     Deep Tendon Reflexes: Reflexes are normal and symmetric. Reflexes normal. Babinski sign absent on the right side. Babinski sign absent on the left side.     Reflex Scores:      Tricep reflexes are 2+ on the right side and 2+ on the left side.      Bicep reflexes are 2+ on the right side and 2+ on the left side.      Brachioradialis reflexes are 2+ on the right side and 2+ on  the left side.      Patellar reflexes are 2+ on the right side and 2+ on the left side.      Achilles reflexes are 2+ on the right side and 2+ on the left side. Psychiatric:        Speech: Speech normal.        Behavior: Behavior normal.        Thought Content: Thought content normal.        Judgment: Judgment normal.     Assessment & Plan:   Problem List Items Addressed This Visit       Digestive   IBS (irritable bowel syndrome)   Relevant Orders   CBC with Differential/Platelet   Comprehensive metabolic panel   Thyroid Panel With TSH     Other   Hyperlipidemia   Relevant Orders   Lipid panel   History of salivary gland cancer - Primary   Relevant Orders   Ambulatory referral to Hematology / Oncology    Other Visit Diagnoses    Anxiety       Relevant Medications   escitalopram (LEXAPRO) 5 MG tablet   Other Relevant Orders   CBC with Differential/Platelet   Comprehensive metabolic panel   Thyroid Panel With TSH     Meds ordered this encounter  Medications  . escitalopram (LEXAPRO) 5 MG tablet    Sig: Take 1 tablet (5 mg total) by mouth daily.    Dispense:  90 tablet    Refill:  0   Labs ordered.    Discussed known black box warning for anti depression/ anxiety medication. Need to report any behavioral changes right, if any homicidal or suicidal thoughts or ideas seek medical attention right away. Call 911.   She will call if she has not heard from oncology for follow up within 2 weeks of todays visit.    Outpatient Encounter Medications as of 10/22/2020  Medication Sig  . acidophilus (RISAQUAD) CAPS capsule Take 1 capsule by mouth daily.  . butalbital-acetaminophen-caffeine (FIORICET) 50-325-40 MG tablet TAKE 1-2 TABLETS EVERY 6 HOURS AS NEEDEDFOR HEADACHE  . escitalopram (LEXAPRO) 5 MG tablet Take 1 tablet (5 mg total) by mouth daily.  . fluticasone (FLONASE) 50 MCG/ACT nasal spray fluticasone propionate 50 mcg/actuation nasal spray,suspension  . Multiple Vitamin (MULTIVITAMIN WITH MINERALS) TABS tablet Take 1 tablet by mouth daily.  . propranolol (INDERAL) 20 MG tablet Take 1 tablet (20 mg total) by mouth 2 (two) times daily.  . traMADol (ULTRAM) 50 MG tablet TAKE ONE TABLET BY MOUTH EVERY 6 HOURS AS NEEDED FOR MODERATE PAIN   No facility-administered encounter medications on file as of 10/22/2020.    Follow-up: Return in about 6 weeks (around  12/03/2020), or if symptoms worsen or fail to improve, for at any time for any worsening symptoms, Go to Emergency room/ urgent care if worse.    Return precautions given. Red Flags discussed. The patient was given clear instructions to go to ER or return to medical center if any red flags develop, symptoms do not improve, worsen or new problems develop. They verbalized understanding.    Risks, benefits, and alternatives of the medications and treatment plan prescribed today were discussed, and patient expressed understanding.    Education regarding symptom management and diagnosis given to patient on AVS.  Patient was in agreement with treatment plan.   Continue to follow with  Kelby Aline. Mohammedali Bedoy AGNP-C, FNP-C for routine health maintenance.   Kelby Aline. Oluwatimileyin Vivier AGNP-C, FNP-C  [employer] Sergei Delo,  FNP 

## 2020-10-23 ENCOUNTER — Telehealth: Payer: Self-pay | Admitting: Adult Health

## 2020-10-23 NOTE — Telephone Encounter (Signed)
Pt called and wanted to discuss the escitalopram (LEXAPRO) 5 MG tablet she started last night. Since starting the medication she has had sever diarrhea

## 2020-10-23 NOTE — Telephone Encounter (Signed)
Pt has IBS and has an issue of diarrhea in the AM. Pt takes 2 immodium in the morning. Pt thinks that the Lexapro is worsening the diarrhea. She started to use the restroom without notice, and the diarrhea was very watery. Pt is off the last week of May and wants to start taking the Lexapro around then to build up her tolerance to the medication.  Pt declines making an appointment and states that she is not in any distress nor needs to go to the urgent care. She has kept her appointment for 12/03/2020. She asks if it would be acceptable to start the lexapro during the last week of may to build a tolerance and stop the diarrhea.

## 2020-10-23 NOTE — Telephone Encounter (Signed)
Lexapro was just started 10/22/20 yesterday, doubtful that it is the cause of sudden onset of diarrhea. If she continues to have symptoms she should be evaluated for the diarrhea.  Seek care if worsening at anytime.  Ok to withhold Lexapro she has only had for one day.

## 2020-10-24 NOTE — Telephone Encounter (Signed)
Left a message to call back.

## 2020-10-24 NOTE — Telephone Encounter (Signed)
Krystal Walker returned the call for lab results. Krystal Walker verbalized understanding and had no further questions. She states that the diarrhea has continued and she doubts that it is due to the lexapro but she is agreeable to take it starting Memorial Day 2022. She declined scheduling an appointment with our office and states that she will call her gastro doctor Dr. Vicente Males and schedule an appointment with them.

## 2020-11-03 ENCOUNTER — Other Ambulatory Visit: Payer: Self-pay | Admitting: Internal Medicine

## 2020-11-07 ENCOUNTER — Other Ambulatory Visit: Payer: Self-pay | Admitting: Adult Health

## 2020-11-07 MED ORDER — PROPRANOLOL HCL 20 MG PO TABS: 20.0000 mg | ORAL_TABLET | Freq: Two times a day (BID) | ORAL | 1 refills | Status: AC

## 2020-11-08 ENCOUNTER — Other Ambulatory Visit: Payer: Self-pay | Admitting: Family

## 2020-11-12 ENCOUNTER — Telehealth: Payer: Self-pay

## 2020-11-12 NOTE — Telephone Encounter (Signed)
FYI

## 2020-11-12 NOTE — Telephone Encounter (Signed)
Pt called and wanted PCP to know she tested positive for covid yesterday. She has congestion, fatigue, and headache. She declined a VV or to speak with a nurse. She just wanted her to be aware and have it in her chart. FYI

## 2020-11-15 ENCOUNTER — Other Ambulatory Visit: Payer: Self-pay | Admitting: Adult Health

## 2020-11-15 ENCOUNTER — Encounter: Payer: Self-pay | Admitting: Adult Health

## 2020-11-15 MED ORDER — BUTALBITAL-APAP-CAFFEINE 50-325-40 MG PO TABS
ORAL_TABLET | ORAL | 0 refills | Status: DC
Start: 2020-11-15 — End: 2020-12-20

## 2020-11-16 NOTE — Telephone Encounter (Signed)
Pt wants provider to know that she's had a fever and the fever started on 5/30 . Pt states she took to at home test today 6/10 and they both came back positive. Pt states she's been to work the last two weeks and wont be going tomorrow. Pt stated she tested positive for COVID originally on 6/6 with an at home test. I informed pt that since it was so late in the day we couldn't get her scheduled for one. Pt states she is going to try and get a PCR done at [facility]. Pt is still having symptoms and is expected to return to work Tuesday.

## 2020-11-16 NOTE — Telephone Encounter (Signed)
Pt would like a call back to discuss a positive covid test and to see if she needs a PCR test since her work only allows her to be out for 5 days and she is still testing positive

## 2020-11-18 ENCOUNTER — Encounter: Payer: Self-pay | Admitting: Adult Health

## 2020-11-19 ENCOUNTER — Other Ambulatory Visit: Payer: Self-pay | Admitting: Adult Health

## 2020-11-19 MED ORDER — FLUOXETINE HCL 10 MG PO TABS: 10.0000 mg | ORAL_TABLET | Freq: Every day | ORAL | 0 refills | Status: DC

## 2020-11-19 NOTE — Progress Notes (Signed)
Medications Discontinued During This Encounter  Medication Reason   escitalopram (LEXAPRO) 5 MG tablet Completed Course   traMADol (ULTRAM) 50 MG tablet Completed Course     Meds ordered this encounter  Medications   FLUoxetine (PROZAC) 10 MG tablet    Sig: Take 1 tablet (10 mg total) by mouth daily.    Dispense:  90 tablet    Refill:  0

## 2020-12-03 ENCOUNTER — Ambulatory Visit: Payer: Managed Care, Other (non HMO) | Admitting: Adult Health

## 2020-12-11 ENCOUNTER — Telehealth: Payer: Self-pay

## 2020-12-11 NOTE — Telephone Encounter (Signed)
Pt was supposed to have a follow up visit after being prescribed Prozac. She states that Laverna Peace, FNP told her that she needed to see her prior to getting refills. Pt states that she is almost out and that she is tolerating it well as of right now. Please advise. Pt prefers Mondays if she needs to come in

## 2020-12-12 NOTE — Telephone Encounter (Signed)
Called pt. Pt states pharmacy only gave her 30 tablets. I informed pt that 90 day supply was sent and she should be able to pick up the rest from pharmacy. Pt was scheduled for follow up visit with michelle.

## 2020-12-17 ENCOUNTER — Other Ambulatory Visit: Payer: Self-pay | Admitting: Adult Health

## 2020-12-17 NOTE — Telephone Encounter (Signed)
Pt needs refill on butalbital-acetaminophen-caffeine (FIORICET) 50-325-40 MG tablet sent in to [facility]. She only has a few left.

## 2020-12-18 ENCOUNTER — Other Ambulatory Visit: Payer: Self-pay | Admitting: Adult Health

## 2020-12-18 NOTE — Telephone Encounter (Signed)
RX Refill:fioricet Last Seen:10-22-20 Last ordered:11-15-20

## 2020-12-18 NOTE — Telephone Encounter (Signed)
Need to clarify a few things.  Per review of chart, she just had fioricet refilled on 11/15/20 - (received 60 tablets). She should not be out ( or near out of medication).  How often is she taking medication and why is she taking so frequently (if she is).  Taking an increased amount of fioricet can lead to rebound headaches.

## 2020-12-19 NOTE — Telephone Encounter (Signed)
Left message for patient to return call back.  

## 2020-12-19 NOTE — Telephone Encounter (Signed)
Spoken to patient. She stated that she take two tablets everyday around 10 am. Reason is that when she works at [employer] she is straining her eyes an will develop a migraine. She does not take the medication on her days off. She only take the two tablets once a day and that seems to rid her of her developing migraine/headaches for the full 6 hours. She doesn't take anymore than the two.

## 2020-12-20 ENCOUNTER — Other Ambulatory Visit: Payer: Self-pay | Admitting: Adult Health

## 2020-12-20 NOTE — Telephone Encounter (Signed)
Left message for patient to return call back.  

## 2020-12-20 NOTE — Telephone Encounter (Signed)
Please notify Ms Kleinman that fioricet should be used to abort headaches.  Taking this amount of fioricet can lead to rebound headaches.  I can refill the medication, but would like for her to start taking one fioricet alternating with two fioricet qod.  Can then continue to work to taper the dose more.  See me about this.

## 2020-12-20 NOTE — Telephone Encounter (Signed)
PT called to return missed call

## 2020-12-21 ENCOUNTER — Other Ambulatory Visit: Payer: Self-pay | Admitting: Internal Medicine

## 2020-12-21 MED ORDER — BUTALBITAL-APAP-CAFFEINE 50-325-40 MG PO TABS
ORAL_TABLET | ORAL | 0 refills | Status: DC
Start: 2020-12-21 — End: 2021-03-07

## 2020-12-21 MED ORDER — BUTALBITAL-APAP-CAFFEINE 50-325-40 MG PO TABS
ORAL_TABLET | ORAL | 0 refills | Status: DC
Start: 2020-12-21 — End: 2020-12-21

## 2020-12-21 NOTE — Telephone Encounter (Signed)
I have sent in rx for fioricet as directed.

## 2020-12-21 NOTE — Progress Notes (Signed)
Rx sent in for fioricet #45 with no refills.  See presription request message.  Tapering.

## 2020-12-21 NOTE — Telephone Encounter (Signed)
Patient has been informed. She stated she will comply with directions tho reluctantly due to how long she has been taking it and it has been working just fine. She would [facility]

## 2020-12-21 NOTE — Telephone Encounter (Signed)
Left message for patient to return call back.  

## 2021-01-22 ENCOUNTER — Other Ambulatory Visit: Payer: Self-pay

## 2021-01-22 ENCOUNTER — Ambulatory Visit
Admission: EM | Admit: 2021-01-22 | Discharge: 2021-01-22 | Disposition: A | Discharge status: Home / Self Care | Payer: Managed Care, Other (non HMO) | Attending: Internal Medicine | Admitting: Internal Medicine

## 2021-01-22 ENCOUNTER — Ambulatory Visit (INDEPENDENT_AMBULATORY_CARE_PROVIDER_SITE_OTHER): Payer: Managed Care, Other (non HMO)

## 2021-01-22 DIAGNOSIS — S90122A Contusion of left lesser toe(s) without damage to nail, initial encounter: Secondary | ICD-10-CM | POA: Diagnosis not present

## 2021-01-22 DIAGNOSIS — S92502A Displaced unspecified fracture of left lesser toe(s), initial encounter for closed fracture: Secondary | ICD-10-CM

## 2021-01-22 MED ORDER — TRAMADOL HCL 50 MG PO TABS: 50.0000 mg | ORAL_TABLET | Freq: Four times a day (QID) | ORAL | 0 refills | Status: AC | PRN

## 2021-01-22 NOTE — ED Triage Notes (Signed)
Pt reports hitting her little toe on L foot this morning on an exercise machine. Toe is swollen and discolored.

## 2021-01-22 NOTE — ED Provider Notes (Addendum)
[facility]    CSN: [id number] Arrival date & time: 01/22/21  1523      History   Chief Complaint Chief Complaint  Patient presents with   Toe Pain    HPI Krystal Walker is a 65 y.o. female who presents with L small toe pain since she hit it on an exercise machine this am. 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 She has taken Tramadol in the past for back pain, but has expired.     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   Osteoporosis 04/20/2017   Frequent headaches 08/13/2015   Raynaud phenomenon 04/12/2012   IBS (irritable bowel syndrome) 04/12/2012   Essential hypertension    Hyperlipidemia     Past Surgical History:  Procedure Laterality Date   ABDOMINAL HYSTERECTOMY  2004   partial   BREAST BIOPSY Left 02-23-12   w/clip - benign   COLONOSCOPY WITH PROPOFOL N/A 08/11/2016   Procedure: COLONOSCOPY WITH PROPOFOL;  Surgeon: Jonathon Bellows, MD;  Location: [facility] ENDOSCOPY;  Service: Endoscopy;  Laterality: N/A;   ESOPHAGOGASTRODUODENOSCOPY (EGD) WITH PROPOFOL N/A 08/10/2016   Procedure: ESOPHAGOGASTRODUODENOSCOPY (EGD) WITH PROPOFOL;  Surgeon: Wilford Corner, MD;  Location: [facility] ENDOSCOPY;  Service: Endoscopy;  Laterality: N/A;   PAROTIDECTOMY Right    adenoid cystic carcinoma   SALIVARY GLAND SURGERY  1999    OB History     Gravida  1   Para      Term      Preterm      AB      Living  0      SAB      IAB      Ectopic      Multiple      Live Births           Obstetric Comments   Age first menstrual cycle 40 Age first pregnancy 73 Last menstrual cycle 2004          Home Medications    Prior to Admission medications   Medication Sig Start Date End Date Taking? Authorizing Provider  FLUoxetine (PROZAC) 10 MG tablet Take 1 tablet (10 mg total) by mouth daily. 11/19/20   Flinchum, Kelby Aline, FNP  traMADol (ULTRAM) 50 MG tablet Take 1 tablet (50 mg total) by mouth every 6 (six) hours as needed. 01/22/21  Yes Rodriguez-Southworth, Sunday Spillers, PA-C  acidophilus (RISAQUAD) CAPS capsule Take 1 capsule by mouth daily.    [provider]  butalbital-acetaminophen-caffeine (FIORICET) 50-325-40 MG tablet TAKE 1 tablet alternating with 2 tablets qod. 12/21/20   Einar Pheasant, MD  fluticasone (FLONASE) 50 MCG/ACT nasal spray fluticasone propionate 50 mcg/actuation nasal spray,suspension    [provider]  Multiple Vitamin (MULTIVITAMIN WITH MINERALS) TABS tablet Take 1 tablet by mouth daily.    [provider]  propranolol (INDERAL) 20 MG tablet Take 1 tablet (20 mg total) by mouth 2 (two) times daily. 11/07/20   Flinchum,  Kelby Aline, FNP    Family History Family History  Problem Relation Age of Onset   Arrhythmia Mother        pacemaker   Fainting Mother    Hypertension Mother    Hyperlipidemia Mother    Breast cancer Neg Hx     Social History Social History   Tobacco Use   Smoking status: Former    Packs/day: 1.00    Years: 15.00    Pack years: 15.00    Types: Cigarettes   Smokeless tobacco: Never   Tobacco comments:    quit 1986  Vaping Use   Vaping Use: Never used  Substance Use Topics   Alcohol use: No    Alcohol/week: 0.0 standard drinks   Drug use: No     Allergies   Morphine, Nickel, Other, Latex, and Morphine and related   Review of Systems Review of Systems  Musculoskeletal:  Positive for arthralgias and gait problem.  Skin:  Positive for color change. Negative for rash and wound.  Neurological:  Negative for  numbness.    Physical Exam Triage Vital Signs ED Triage Vitals [01/22/21 1530]  Enc Vitals Group     BP (!) 173/97     Pulse Rate 72     Resp 18     Temp 98.8 F (37.1 C)     Temp Source Oral     SpO2 98 %     Weight 132 lb (59.9 kg)     Height '5\' 4"'$  (1.626 m)     Head Circumference      Peak Flow      Pain Score 8     Pain Loc      Pain Edu?      Excl. in [place]?    No data found.  Updated Vital Signs BP (!) 173/97   Pulse 72   Temp 98.8 F (37.1 C) (Oral)   Resp 18   Ht '5\' 4"'$  (1.626 m)   Wt 132 lb (59.9 kg)   SpO2 98%   BMI 22.66 kg/m   Visual Acuity Right Eye Distance:   Left Eye Distance:   Bilateral Distance:    Right Eye Near:   Left Eye Near:    Bilateral Near:     Physical Exam Constitutional:      Appearance: She is normal weight.  HENT:     Head: Normocephalic.     Right Ear: External ear normal.     Left Ear: External ear normal.  Eyes:     General: No scleral icterus.    Conjunctiva/sclera: Conjunctivae normal.  Pulmonary:     Effort: Pulmonary effort is normal.  Musculoskeletal:        General: Signs of injury present. No deformity.     Cervical back: Neck supple.     Comments: L FOOT- with swelling, ecchymosis and very tender L 5th toe. She is not tender on the other toes  Skin:    General: Skin is warm and dry.     Findings: Bruising present. No rash.  Neurological:     Mental Status: She is alert and oriented to person, place, and time.     Sensory: No sensory deficit.     Gait: Gait abnormal.  Psychiatric:        Mood and Affect: Mood normal.        Thought Content: Thought content normal.        Judgment: Judgment normal.     UC Treatments /  Results  Labs (all labs ordered are listed, but only abnormal results are displayed) Labs Reviewed - No data to display  EKG   Radiology DG Foot Complete Left  Result Date: 01/22/2021 CLINICAL DATA:  Contusion of left fifth toe. Stubbed pinky toe against exercise bike. Pain.  EXAM: LEFT FOOT - COMPLETE 3+ VIEW COMPARISON:  None. FINDINGS: Question of nondisplaced fracture involving the fourth toe proximal phalanx at the metatarsal phalangeal joint. No additional fracture, particularly, no fifth toe fracture. Small Achilles tendon enthesophyte. There is no evidence of arthropathy or other focal bone abnormality. Mild dorsal and lateral soft tissue edema. IMPRESSION: 1. Question of nondisplaced fracture involving the fourth toe proximal phalanx at the metatarsophalangeal joint. 2. No fifth toe fracture. Electronically Signed   By: Keith Rake M.D.   On: 01/22/2021 16:28    Procedures Procedures (including critical care time)  Medications Ordered in UC Medications - No data to display  Initial Impression / Assessment and Plan / UC Course  I have reviewed the triage vital signs and the nursing notes. Pertinent imaging results that were available during my care of the patient were reviewed by me and considered in my medical decision making (see chart for details). Her fractured toe was buddy taped and she was placed on a post op shoe. 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 I prescribed her Tramadol for pain. See instructions.   Final Clinical Impressions(s) / UC Diagnoses   Final diagnoses:  Closed displaced fracture of phalanx of lesser toe of left foot, unspecified phalanx, initial encounter     Discharge Instructions      Keep the toe buddy taped for 6 weeks. Follow up with your family doctor or [facility] in 2 weeks.  Ice area of pain for 20 minutes at a time today and tomorrow.      ED Prescriptions     Medication Sig Dispense Auth. Provider   traMADol (ULTRAM) 50 MG tablet Take 1 tablet (50 mg total) by mouth every 6 (six) hours as needed. 15 tablet Rodriguez-Southworth, Sunday Spillers, PA-C      I have reviewed the PDMP during this encounter.   Shelby Mattocks,  PA-C 01/22/21 [place], [place], PA-C 01/22/21 1731

## 2021-01-22 NOTE — Discharge Instructions (Addendum)
Keep the toe buddy taped for 6 weeks. Follow up with your family doctor or [facility] in 2 weeks.  Ice area of pain for 20 minutes at a time today and tomorrow.

## 2021-01-29 LAB — LIPID PANEL
Cholesterol: 242 — AB (ref 0–200)
HDL: 60 (ref 35–70)
LDL Cholesterol: 155
LDl/HDL Ratio: 4

## 2021-01-29 LAB — BASIC METABOLIC PANEL
Creatinine: 0.8 (ref <=1.1)
Glucose: 96

## 2021-01-29 LAB — HEMOGLOBIN A1C: Hemoglobin A1C: 5.5

## 2021-02-04 ENCOUNTER — Other Ambulatory Visit: Payer: Self-pay | Admitting: Adult Health

## 2021-02-04 DIAGNOSIS — Z1231 Encounter for screening mammogram for malignant neoplasm of breast: Secondary | ICD-10-CM

## 2021-02-18 ENCOUNTER — Ambulatory Visit: Payer: Managed Care, Other (non HMO) | Admitting: Gastroenterology

## 2021-02-18 ENCOUNTER — Other Ambulatory Visit: Payer: Self-pay

## 2021-02-18 ENCOUNTER — Encounter: Payer: Self-pay | Admitting: Gastroenterology

## 2021-02-18 VITALS — BP 138/97 | HR 71 | Temp 98.3°F | Ht 64.96 in | Wt 143.0 lb

## 2021-02-18 DIAGNOSIS — K582 Mixed irritable bowel syndrome: Secondary | ICD-10-CM

## 2021-02-18 NOTE — Patient Instructions (Signed)
High-Fiber Eating Plan °Fiber, also called dietary fiber, is a type of carbohydrate. It is found foods such as fruits, vegetables, whole grains, and beans. A high-fiber diet can have many health benefits. Your health care provider may recommend a high-fiber diet to help: °Prevent constipation. Fiber can make your bowel movements more regular. °Lower your cholesterol. °Relieve the following conditions: °Inflammation of veins in the anus (hemorrhoids). °Inflammation of specific areas of the digestive tract (uncomplicated diverticulosis). °A problem of the large intestine, also called the colon, that sometimes causes pain and diarrhea (irritable bowel syndrome, or IBS). °Prevent overeating as part of a weight-loss plan. °Prevent heart disease, type 2 diabetes, and certain cancers. °What are tips for following this plan? °Reading food labels ° °Check the nutrition facts label on food products for the amount of dietary fiber. Choose foods that have 5 grams of fiber or more per serving. °The goals for recommended daily fiber intake include: °Men (age 50 or younger): 34-38 g. °Men (over age 50): 28-34 g. °Women (age 50 or younger): 25-28 g. °Women (over age 50): 22-25 g. °Your daily fiber goal is _____________ g. °Shopping °Choose whole fruits and vegetables instead of processed forms, such as apple juice or applesauce. °Choose a wide variety of high-fiber foods such as avocados, lentils, oats, and kidney beans. °Read the nutrition facts label of the foods you choose. Be aware of foods with added fiber. These foods often have high sugar and sodium amounts per serving. °Cooking °Use whole-grain flour for baking and cooking. °Cook with brown rice instead of white rice. °Meal planning °Start the day with a breakfast that is high in fiber, such as a cereal that contains 5 g of fiber or more per serving. °Eat breads and cereals that are made with whole-grain flour instead of refined flour or white flour. °Eat brown rice, bulgur  wheat, or millet instead of white rice. °Use beans in place of meat in soups, salads, and pasta dishes. °Be sure that half of the grains you eat each day are whole grains. °General information °You can get the recommended daily intake of dietary fiber by: °Eating a variety of fruits, vegetables, grains, nuts, and beans. °Taking a fiber supplement if you are not able to take in enough fiber in your diet. It is better to get fiber through food than from a supplement. °Gradually increase how much fiber you consume. If you increase your intake of dietary fiber too quickly, you may have bloating, cramping, or gas. °Drink plenty of water to help you digest fiber. °Choose high-fiber snacks, such as berries, raw vegetables, nuts, and popcorn. °What foods should I eat? °Fruits °Berries. Pears. Apples. Oranges. Avocado. Prunes and raisins. Dried figs. °Vegetables °Sweet potatoes. Spinach. Kale. Artichokes. Cabbage. Broccoli. Cauliflower. Green peas. Carrots. Squash. °Grains °Whole-grain breads. Multigrain cereal. Oats and oatmeal. Brown rice. Barley. Bulgur wheat. Millet. Quinoa. Bran muffins. Popcorn. Rye wafer crackers. °Meats and other proteins °Navy beans, kidney beans, and pinto beans. Soybeans. Split peas. Lentils. Nuts and seeds. °Dairy °Fiber-fortified yogurt. °Beverages °Fiber-fortified soy milk. Fiber-fortified orange juice. °Other foods °Fiber bars. °The items listed above may not be a complete list of recommended foods and beverages. Contact a dietitian for more information. °What foods should I avoid? °Fruits °Fruit juice. Cooked, strained fruit. °Vegetables °Fried potatoes. Canned vegetables. Well-cooked vegetables. °Grains °White bread. Pasta made with refined flour. White rice. °Meats and other proteins °Fatty cuts of meat. Fried chicken or fried fish. °Dairy °Milk. Yogurt. Cream cheese. Sour cream. °Fats and   oils °Butters. °Beverages °Soft drinks. °Other foods °Cakes and pastries. °The items listed above may  not be a complete list of foods and beverages to avoid. Talk with your dietitian about what choices are best for you. °Summary °Fiber is a type of carbohydrate. It is found in foods such as fruits, vegetables, whole grains, and beans. °A high-fiber diet has many benefits. It can help to prevent constipation, lower blood cholesterol, aid weight loss, and reduce your risk of heart disease, diabetes, and certain cancers. °Increase your intake of fiber gradually. Increasing fiber too quickly may cause cramping, bloating, and gas. Drink plenty of water while you increase the amount of fiber you consume. °The best sources of fiber include whole fruits and vegetables, whole grains, nuts, seeds, and beans. °This information is not intended to replace advice given to you by your health care provider. Make sure you discuss any questions you have with your health care provider. °Document Revised: 09/29/2019 Document Reviewed: 09/29/2019 °Elsevier Patient Education © 2022 Elsevier Inc. ° °

## 2021-02-18 NOTE — Progress Notes (Signed)
Jonathon Bellows MD, MRCP(U.K) [address]  [place]  [place],  [address]  Main: [phone]  Fax: [phone]   Gastroenterology Consultation  Referring Provider:     Sharmon Leyden* Primary Care Physician:  Doreen Beam, FNP Primary Gastroenterologist:  Dr. Jonathon Bellows  Reason for Consultation:   IBS        HPI:   Krystal Walker is a 65 y.o. y/o female referred for IBS.  She was previously my patient was last seen in November 2018.  History of NSAID use.  At that point of time I performed an EGD valve which was normal and a colonoscopy was also performed which had a poor prep.  Over 20 years history of IBS.  Was doing well on a high-fiber diet.  She had a lot of bloating and gas probably due to use of artificial sugars and large quantities.  I had advised her to stop it at that point of time.  Her main complaint presently is constipation followed by diarrhea ongoing for many years.  She still takes fiber supplements but has not tried any MiraLAX.  She has a diary which shows a few days of months she has diarrhea and always preceded by constipation. 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    Past Surgical History:  Procedure Laterality Date   ABDOMINAL HYSTERECTOMY  2004   partial   BREAST BIOPSY Left 02-23-12   w/clip - benign   COLONOSCOPY WITH PROPOFOL N/A 08/11/2016   Procedure: COLONOSCOPY WITH PROPOFOL;  Surgeon: Jonathon Bellows, MD;  Location: [facility] ENDOSCOPY;  Service: Endoscopy;  Laterality: N/A;   ESOPHAGOGASTRODUODENOSCOPY (EGD) WITH PROPOFOL N/A 08/10/2016   Procedure: ESOPHAGOGASTRODUODENOSCOPY (EGD) WITH PROPOFOL;  Surgeon: Wilford Corner, MD;   Location: [facility] ENDOSCOPY;  Service: Endoscopy;  Laterality: N/A;   PAROTIDECTOMY Right    adenoid cystic carcinoma   SALIVARY GLAND SURGERY  1999    Prior to Admission medications   Medication Sig Start Date End Date Taking? Authorizing Provider  FLUoxetine (PROZAC) 10 MG tablet Take 1 tablet (10 mg total) by mouth daily. 11/19/20   Flinchum, Kelby Aline, FNP  acidophilus (RISAQUAD) CAPS capsule Take 1 capsule by mouth daily.    [provider]  butalbital-acetaminophen-caffeine (FIORICET) 50-325-40 MG tablet TAKE 1 tablet alternating with 2 tablets qod. 12/21/20   Einar Pheasant, MD  fluticasone (FLONASE) 50 MCG/ACT nasal spray fluticasone propionate 50 mcg/actuation nasal spray,suspension    [provider]  Multiple Vitamin (MULTIVITAMIN WITH MINERALS) TABS tablet Take 1 tablet by mouth daily.    [provider]  propranolol (INDERAL) 20 MG tablet Take 1 tablet (20 mg total) by mouth 2 (two) times daily. 11/07/20   Flinchum, Kelby Aline, FNP  traMADol (ULTRAM) 50 MG tablet Take 1 tablet (50 mg total) by mouth every 6 (six) hours as needed. 01/22/21   Rodriguez-Southworth, Sunday Spillers, PA-C    Family History  Problem Relation Age of Onset   Arrhythmia Mother        pacemaker   Fainting Mother    Hypertension Mother    Hyperlipidemia Mother    Breast  cancer Neg Hx      Social History   Tobacco Use   Smoking status: Former    Packs/day: 1.00    Years: 15.00    Pack years: 15.00    Types: Cigarettes   Smokeless tobacco: Never   Tobacco comments:    quit 1986  Vaping Use   Vaping Use: Never used  Substance Use Topics   Alcohol use: No    Alcohol/week: 0.0 standard drinks   Drug use: No    Allergies as of 02/18/2021 - Review Complete 01/22/2021  Allergen Reaction Noted   Morphine Nausea And Vomiting 11/06/2011   Nickel Other (See Comments) and Rash 09/03/2015   Other Rash 03/01/2018   Latex Hives and Itching 12/08/2011   Morphine and related Nausea  And Vomiting 11/06/2011    Review of Systems:    All systems reviewed and negative except where noted in HPI.   Physical Exam:  There were no vitals taken for this visit. No LMP recorded. Patient has had a hysterectomy. Psych:  Alert and cooperative. Normal mood and affect. General:   Alert,  Well-developed, well-nourished, pleasant and cooperative in NAD Head:  Normocephalic and atraumatic. Eyes:  Sclera clear, no icterus.   Conjunctiva pink. Ears:  Normal auditory acuity..    Neurologic:  Alert and oriented x3;  grossly normal neurologically. Psych:  Alert and cooperative. Normal mood and affect.  Imaging Studies: DG Foot Complete Left  Result Date: 01/22/2021 CLINICAL DATA:  Contusion of left fifth toe. Stubbed pinky toe against exercise bike. Pain. EXAM: LEFT FOOT - COMPLETE 3+ VIEW COMPARISON:  None. FINDINGS: Question of nondisplaced fracture involving the fourth toe proximal phalanx at the metatarsal phalangeal joint. No additional fracture, particularly, no fifth toe fracture. Small Achilles tendon enthesophyte. There is no evidence of arthropathy or other focal bone abnormality. Mild dorsal and lateral soft tissue edema. IMPRESSION: 1. Question of nondisplaced fracture involving the fourth toe proximal phalanx at the metatarsophalangeal joint. 2. No fifth toe fracture. Electronically Signed   By: Keith Rake M.D.   On: 01/22/2021 16:28    Assessment and Plan:   Krystal Walker is a 65 y.o. y/o female has been referred for IBS.  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  In the past I have seen her and had tried suggesting that she commence on MiraLAX she is already scheduled to commence on MiraLAX.  He is already on a high-fiber diet and supplement.  Reassured her to try half a capful of MiraLAX every day for a week.  In addition I have given her a sample of probiotic which may or may not help her underlying cause.  If she is not better in a week may have to consider colonoscopy as  her prep was inadequate in 2018 and its been over 10 years since she had her last complete colonoscopy and evaluation.  She will call my office in 5 to 7 days to inform me if things are better or not  Follow up in as needed  Dr Jonathon Bellows MD,MRCP(U.K)

## 2021-02-20 ENCOUNTER — Telehealth: Payer: Self-pay | Admitting: Gastroenterology

## 2021-02-20 ENCOUNTER — Other Ambulatory Visit: Payer: Self-pay

## 2021-02-20 NOTE — Telephone Encounter (Signed)
Pt. Requesting call back she is having trouble finding Restora that she was given in samples at her last appt. She woud like a call baxck to get help nwith that.

## 2021-02-20 NOTE — Telephone Encounter (Signed)
Called patient back and she stated that she felt that Restora is actually helping her. Patient wanted to know if we could provide her with more samples. I told her that we could and that I would leave it at the front desk. Patient also stated that she went to he pharmacy and asked them if they had these capsules and they told her that they have not heard of it. She looked and see if she was able to find it without a prescription, on [email or web address] and nothing. Patient wanted to know if there was something similar to that so she could get. Please advise.

## 2021-02-21 NOTE — Telephone Encounter (Signed)
Give more samples- she can pick up alternative at any pharmacy with a probiotic that contains " lactobacillus casei" I believe [facility] has a store brand which has this available

## 2021-02-21 NOTE — Telephone Encounter (Signed)
I message patient via MyChart with Dr. Georgeann Oppenheim recommendations.

## 2021-03-06 ENCOUNTER — Other Ambulatory Visit: Payer: Self-pay | Admitting: Internal Medicine

## 2021-03-07 NOTE — Telephone Encounter (Signed)
RX Refill:fioricet Last Seen:11-19-20 Last ordered:12-21-20

## 2021-03-18 ENCOUNTER — Ambulatory Visit: Payer: Managed Care, Other (non HMO) | Admitting: Gastroenterology

## 2021-03-18 ENCOUNTER — Other Ambulatory Visit: Payer: Self-pay

## 2021-03-18 ENCOUNTER — Ambulatory Visit
Admission: RE | Admit: 2021-03-18 | Discharge: 2021-03-18 | Disposition: A | Discharge status: Home / Self Care | Payer: Managed Care, Other (non HMO) | Source: Ambulatory Visit | Attending: Adult Health | Admitting: Adult Health

## 2021-03-18 DIAGNOSIS — Z1231 Encounter for screening mammogram for malignant neoplasm of breast: Secondary | ICD-10-CM | POA: Insufficient documentation

## 2021-04-01 ENCOUNTER — Encounter: Payer: Managed Care, Other (non HMO) | Admitting: Internal Medicine

## 2021-04-01 ENCOUNTER — Encounter: Payer: Managed Care, Other (non HMO) | Admitting: Adult Health

## 2021-04-05 ENCOUNTER — Telehealth: Payer: Self-pay | Admitting: Family

## 2021-04-10 ENCOUNTER — Telehealth: Payer: Self-pay

## 2021-04-10 ENCOUNTER — Other Ambulatory Visit: Payer: Self-pay | Admitting: Family

## 2021-04-10 MED ORDER — BUTALBITAL-APAP-CAFFEINE 50-325-40 MG PO TABS: ORAL_TABLET | ORAL | 1 refills | Status: AC

## 2021-04-10 NOTE — Telephone Encounter (Signed)
duplicate

## 2021-04-22 ENCOUNTER — Encounter: Payer: Self-pay | Admitting: Adult Health

## 2021-04-24 ENCOUNTER — Telehealth: Payer: Self-pay

## 2021-04-24 NOTE — Telephone Encounter (Signed)
See telephone note.

## 2021-04-24 NOTE — Telephone Encounter (Signed)
Placed call to pt to schedule physical with Sharyn Lull. Per pt message on mychart. Pt can be scheduled 11/28 and on with [facility] for physical if she chooses. LMTCB

## 2021-04-29 ENCOUNTER — Encounter: Payer: Managed Care, Other (non HMO) | Admitting: Family Medicine

## 2021-05-09 ENCOUNTER — Encounter: Payer: Self-pay | Admitting: Adult Health

## 2021-05-09 ENCOUNTER — Other Ambulatory Visit: Payer: Self-pay

## 2021-05-09 ENCOUNTER — Ambulatory Visit (INDEPENDENT_AMBULATORY_CARE_PROVIDER_SITE_OTHER): Payer: Managed Care, Other (non HMO) | Admitting: Adult Health

## 2021-05-09 VITALS — BP 122/88 | HR 93 | Temp 97.9°F | Ht 64.95 in | Wt 141.6 lb

## 2021-05-09 DIAGNOSIS — Z85818 Personal history of malignant neoplasm of other sites of lip, oral cavity, and pharynx: Secondary | ICD-10-CM

## 2021-05-09 DIAGNOSIS — G44229 Chronic tension-type headache, not intractable: Secondary | ICD-10-CM

## 2021-05-09 DIAGNOSIS — Z0000 Encounter for general adult medical examination without abnormal findings: Secondary | ICD-10-CM | POA: Diagnosis not present

## 2021-05-09 DIAGNOSIS — E782 Mixed hyperlipidemia: Secondary | ICD-10-CM | POA: Diagnosis not present

## 2021-05-09 DIAGNOSIS — I10 Essential (primary) hypertension: Secondary | ICD-10-CM

## 2021-05-09 DIAGNOSIS — Z1231 Encounter for screening mammogram for malignant neoplasm of breast: Secondary | ICD-10-CM

## 2021-05-09 NOTE — Progress Notes (Signed)
Established Patient Office Visit  Subjective:  Patient ID: Krystal Walker, female    DOB: 1955/12/06  Age: 65 y.o. MRN: [id number]  CC:  Chief Complaint  Patient presents with   [place] presents for transfer of care visit. She previously has seen Webb Silversmith NP at [place]. She reports she has Fioricet taking one in the morning, and one in the evening due to headache that she feels is causing headaches. She has seen neurology in the past was told " scar tissue from migraines" headaches since 65 years old she reports. She has seen neurology [facility]. She has been using Fioricet since she was seeing Rollene Fare NP at [facility].She reports she has been on Fioricet for two years. She drinks no caffeine hardly and reports she is taking for energy/ caffeine. Yesterday she took one.  She is still working at [employer] working 8 hour plus and work is stressful.  IMPRESSION: No acute intracranial abnormality or acute traumatic injury identified. Normal for age non contrast CT appearance of the brain. Electronically Signed   By: Genevie Ann M.D.   On: 11/20/2016 10:18  Propanolol for blood pressure 20 mg in the morning as one in the evening for hypertension.   She had diarrhea with Lexapro and was discontinued and switched to Prozac per patient request as she had been on Prozac in past and tolerated well.  Patient is currently not taking Prozac.  Has any suicidal or homicidal ideations or intents  Otherwise she feels well today.   Was referred to oncology 6 months ago for history of  history salivary gland tumor right side face. IMPRESSION: Postop parotidectomy on the right. No recurrent tumor or adenopathy in the neck.   17 x 27 mm right upper lobe mass with calcification. This may represent scar versus tumor. No prior CT of the chest is available. Follow-up CT chest with contrast recommended for further evaluation. If prior CTs are available at other locations,  correlation would be very helpful to document stability. Electronically Signed   By: Franchot Gallo M.D.   On: 10/12/2017 15:17   Referral Notes Number of Notes: 2 . Type Date User Summary Attachment  General 10/25/2020  3:40 PM [place] - -  Note   Called patient she decided she wants to go back to ENT first and then go from there. MR         . 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 Exposure to carcinogens at [place] wants to see oncology to folloe.        Patient did not go to ENT either she reports. She was referred to Dr. Virgia Land by Dr. Genevive Bi.    She was followed by Dr. Genevive Bi for lung in past, and has had no follow up since.  Per Dr. Genevive Bi office visit 10/28/2019 she was referred again to ENT, patient did not go for this.  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  He recommended that patient follow-up with Dr. Richardson Landry for physical findings in her neck and her vague right-sided neck mass.  Patient does see Dr. Richardson Landry ENT in 2019 but not since Dr. Faith Rogue referred her back for  further evaluation IMPRESSION: 1. Stable partially calcified right apical lesion, likely postinflammatory scarring. No new or enlarging pulmonary nodules. 2. No adenopathy or acute findings. 3. Aortic Atherosclerosis (ICD10-I70.0).     Electronically Signed   By: Richardean Sale M.D.   On: 10/24/2019 15:36   Patient  denies any fever, body aches,chills, rash, chest pain, shortness of breath, nausea, vomiting, or diarrhea.   Mammogram last 03/18/2021 repeat 1 year was within normal limits. Denies any post menopausal bleeding.  Past Medical History:  Diagnosis Date   Depression    GERD (gastroesophageal reflux disease)    GI  bleed    Hyperlipidemia    Hypertension    IBS (irritable bowel syndrome)    Osteoporosis    Personal history of radiation therapy 1999   Raynaud disease    Salivary gland cancer ([place]) 1999   s/p Right salivary gland resection and radiation    Past Surgical History:  Procedure Laterality Date   ABDOMINAL HYSTERECTOMY  2004   partial   BREAST BIOPSY Left 02-23-12   w/clip - benign   COLONOSCOPY WITH PROPOFOL N/A 08/11/2016   Procedure: COLONOSCOPY WITH PROPOFOL;  Surgeon: Jonathon Bellows, MD;  Location: [facility] ENDOSCOPY;  Service: Endoscopy;  Laterality: N/A;   ESOPHAGOGASTRODUODENOSCOPY (EGD) WITH PROPOFOL N/A 08/10/2016   Procedure: ESOPHAGOGASTRODUODENOSCOPY (EGD) WITH PROPOFOL;  Surgeon: Wilford Corner, MD;  Location: [facility] ENDOSCOPY;  Service: Endoscopy;  Laterality: N/A;   PAROTIDECTOMY Right    adenoid cystic carcinoma   SALIVARY GLAND SURGERY  1999    Family History  Problem Relation Age of Onset   Arrhythmia Mother        pacemaker   Fainting Mother    Hypertension Mother    Hyperlipidemia Mother    Breast cancer Neg Hx     Social History   Socioeconomic History   Marital status: Single    Spouse name: Not on file   Number of children: Not on file   Years of education: Not on file   Highest education level: Not on file  Occupational History   Not on file  Tobacco Use   Smoking status: Former    Packs/day: 1.00    Years: 15.00    Pack years: 15.00    Types: Cigarettes   Smokeless tobacco: Never   Tobacco comments:    quit 1986  Vaping Use   Vaping Use: Never used  Substance and Sexual Activity   Alcohol use: No    Alcohol/week: 0.0 standard drinks   Drug use: No   Sexual activity: Not Currently  Other Topics Concern   Not on file  Social History Narrative   Not on file   Social Determinants of Health   Financial Resource Strain: Not on file  Food Insecurity: Not on file  Transportation Needs: Not on file  Physical Activity: Not on file  Stress: Not  on file  Social Connections: Not on file  Intimate Partner Violence: Not on file    Outpatient Medications Prior to Visit  Medication Sig Dispense Refill   butalbital-acetaminophen-caffeine (FIORICET) 50-325-40 MG tablet As directed (Patient taking differently: Take 1 tablet by mouth 2 (two) times daily. As directed) 60 tablet 1   fluticasone (FLONASE) 50 MCG/ACT nasal spray fluticasone propionate 50 mcg/actuation nasal spray,suspension     Multiple Vitamin (MULTIVITAMIN WITH MINERALS) TABS tablet Take 1 tablet by mouth daily.     Multiple Vitamins-Minerals (OCUVITE ADULT 50+ PO) Take 1 tablet by mouth daily.     propranolol (INDERAL)  20 MG tablet Take 1 tablet (20 mg total) by mouth 2 (two) times daily. (Patient taking differently: Take 20 mg by mouth 2 (two) times daily. Take one tablet in the morning and .5 tablets on the evening.) 180 tablet 1   traMADol (ULTRAM) 50 MG tablet Take 1 tablet (50 mg total) by mouth every 6 (six) hours as needed. 15 tablet 0   No facility-administered medications prior to visit.    Allergies  Allergen Reactions   Morphine Nausea And Vomiting   Nickel Other (See Comments) and Rash   Other Rash   Latex Hives and Itching   Morphine And Related Nausea And Vomiting    ROS Review of Systems  Constitutional:  Positive for fatigue. Negative for activity change, appetite change, chills, diaphoresis, fever and unexpected weight change.  HENT: Negative.    Respiratory: Negative.    Cardiovascular: Negative.   Gastrointestinal: Negative.   Genitourinary: Negative.   Musculoskeletal: Negative.   Skin: Negative.   Neurological:  Positive for headaches. Negative for tremors, seizures, syncope, facial asymmetry, speech difficulty, weakness, light-headedness and numbness.  Psychiatric/Behavioral:  Positive for decreased concentration. Negative for agitation, behavioral problems, confusion, dysphoric mood, hallucinations, self-injury, sleep disturbance and  suicidal ideas. The patient is nervous/anxious. The patient is not hyperactive.      Objective:    Physical Exam Vitals and nursing note reviewed.  Constitutional:      General: She is not in acute distress.    Appearance: She is well-developed. She is not ill-appearing, toxic-appearing or diaphoretic.     Interventions: She is not intubated. HENT:     Head: Normocephalic and atraumatic.     Right Ear: Tympanic membrane, ear canal and external ear normal. There is no impacted cerumen.     Left Ear: Tympanic membrane, ear canal and external ear normal. There is no impacted cerumen.     Nose: Nose normal. No congestion or rhinorrhea.     Mouth/Throat:     Pharynx: No oropharyngeal exudate or posterior oropharyngeal erythema.  Eyes:     General: Lids are normal. No scleral icterus.       Right eye: No discharge.        Left eye: No discharge.     Extraocular Movements: Extraocular movements intact.     Conjunctiva/sclera: Conjunctivae normal.     Right eye: Right conjunctiva is not injected. No exudate or hemorrhage.    Left eye: Left conjunctiva is not injected. No exudate or hemorrhage.    Pupils: Pupils are equal, round, and reactive to light.  Neck:     Thyroid: No thyroid mass or thyromegaly.     Vascular: Normal carotid pulses. No carotid bruit, hepatojugular reflux or JVD.     Trachea: Trachea and phonation normal. No tracheal tenderness or tracheal deviation.     Meningeal: Brudzinski's sign and Kernig's sign absent.  Cardiovascular:     Rate and Rhythm: Normal rate and regular rhythm.     Pulses: Normal pulses.          Radial pulses are 2+ on the right side and 2+ on the left side.       Dorsalis pedis pulses are 2+ on the right side and 2+ on the left side.       Posterior tibial pulses are 2+ on the right side and 2+ on the left side.     Heart sounds: Normal heart sounds, S1 normal and S2 normal. Heart sounds not distant. No murmur heard.  No friction rub. No gallop.   Pulmonary:     Effort: Pulmonary effort is normal. No tachypnea, bradypnea, accessory muscle usage or respiratory distress. She is not intubated.     Breath sounds: Normal breath sounds. No stridor. No wheezing, rhonchi or rales.  Chest:     Chest wall: No tenderness.  Abdominal:     General: Bowel sounds are normal. There is no distension or abdominal bruit.     Palpations: Abdomen is soft. There is no shifting dullness, fluid wave, hepatomegaly, splenomegaly, mass or pulsatile mass.     Tenderness: There is no abdominal tenderness. There is no right CVA tenderness, left CVA tenderness, guarding or rebound.     Hernia: No hernia is present.  Musculoskeletal:        General: No tenderness or deformity. Normal range of motion.     Cervical back: Full passive range of motion without pain, normal range of motion and neck supple. No edema, erythema, rigidity or tenderness. No spinous process tenderness or muscular tenderness. Normal range of motion.     Right lower leg: No edema.     Left lower leg: No edema.  Lymphadenopathy:     Head:     Right side of head: No submental, submandibular, tonsillar, preauricular, posterior auricular or occipital adenopathy.     Left side of head: No submental, submandibular, tonsillar, preauricular, posterior auricular or occipital adenopathy.     Cervical: No cervical adenopathy.     Right cervical: No superficial, deep or posterior cervical adenopathy.    Left cervical: No superficial, deep or posterior cervical adenopathy.     Upper Body:     Right upper body: No supraclavicular or pectoral adenopathy.     Left upper body: No supraclavicular or pectoral adenopathy.  Skin:    General: Skin is warm and dry.     Coloration: Skin is not pale.     Findings: No abrasion, bruising, burn, ecchymosis, erythema, lesion, petechiae or rash.     Nails: There is no clubbing.  Neurological:     Mental Status: She is alert and oriented to person, place, and time.      GCS: GCS eye subscore is 4. GCS verbal subscore is 5. GCS motor subscore is 6.     Cranial Nerves: No cranial nerve deficit.     Sensory: No sensory deficit.     Motor: No tremor, atrophy, abnormal muscle tone or seizure activity.     Coordination: Coordination normal.     Gait: Gait normal.     Deep Tendon Reflexes: Reflexes are normal and symmetric. Reflexes normal. Babinski sign absent on the right side. Babinski sign absent on the left side.     Reflex Scores:      Tricep reflexes are 2+ on the right side and 2+ on the left side.      Bicep reflexes are 2+ on the right side and 2+ on the left side.      Brachioradialis reflexes are 2+ on the right side and 2+ on the left side.      Patellar reflexes are 2+ on the right side and 2+ on the left side.      Achilles reflexes are 2+ on the right side and 2+ on the left side. Psychiatric:        Mood and Affect: Mood normal.        Speech: Speech normal.        Behavior: Behavior normal.  Thought Content: Thought content normal.        Judgment: Judgment normal.    BP 122/88 (BP Location: Left Arm, Patient Position: Sitting, Cuff Size: Normal)   Pulse 93   Temp 97.9 F (36.6 C) (Oral)   Ht 5' 4.95" (1.65 m)   Wt 141 lb 9.6 oz (64.2 kg)   SpO2 98%   BMI 23.60 kg/m  Wt Readings from Last 3 Encounters:  05/09/21 141 lb 9.6 oz (64.2 kg)  02/18/21 143 lb (64.9 kg)  01/22/21 132 lb (59.9 kg)     Health Maintenance Due  Topic Date Due   COVID-19 Vaccine (4 - Booster for Moderna series) 06/05/2020   INFLUENZA VACCINE  01/07/2021    There are no preventive care reminders to display for this patient.  Lab Results  Component Value Date   TSH 1.990 01/31/2019   Lab Results  Component Value Date   WBC 3.5 03/22/2020   HGB 13.6 03/22/2020   HCT 38.6 03/22/2020   MCV 95 03/22/2020   PLT 182 03/22/2020   Lab Results  Component Value Date   NA 141 03/22/2020   K 4.6 03/22/2020   CO2 23 03/22/2020   GLUCOSE 109 (H)  03/22/2020   BUN 17 03/22/2020   CREATININE 0.8 01/29/2021   BILITOT <0.2 03/22/2020   ALKPHOS 80 03/22/2020   AST 28 03/22/2020   ALT 23 03/22/2020   PROT 7.0 03/22/2020   ALBUMIN 4.4 03/22/2020   CALCIUM 9.8 03/22/2020   ANIONGAP 10 07/15/2019   Lab Results  Component Value Date   CHOL 242 (A) 01/29/2021   Lab Results  Component Value Date   HDL 60 01/29/2021   Lab Results  Component Value Date   LDLCALC 155 01/29/2021   Lab Results  Component Value Date   TRIG 89 01/31/2019   Lab Results  Component Value Date   CHOLHDL 3.3 01/31/2019   Lab Results  Component Value Date   HGBA1C 5.5 01/29/2021      Assessment & Plan:   Problem List Items Addressed This Visit       Cardiovascular and Mediastinum   Essential hypertension   Relevant Orders   Comprehensive metabolic panel     Other   Hyperlipidemia   Relevant Orders   Comprehensive metabolic panel   History of salivary gland cancer   Relevant Orders   Ambulatory referral to ENT   Other Visit Diagnoses     Routine adult health maintenance    -  Primary   Relevant Orders   VITAMIN D 25 Hydroxy (Vit-D Deficiency, Fractures)   Hemoglobin A1c   TSH   CBC with Differential/Platelet   Comprehensive metabolic panel   Urine Microalbumin w/creat. ratio   Chronic tension-type headache, not intractable       Relevant Orders   Ambulatory referral to Neurology   CT HEAD W & WO CONTRAST (5MM)   Ambulatory referral to ENT   Encounter for screening mammogram for breast cancer       Relevant Orders   MM 3D [place] today.  A follow-up CT scan ordered to rule out any tumor mass or growth given patient's chronic headaches.  Patient has continued to take 2 Fioricet most days with good relief and better concentration.  Discussed with patient could consider Wellbutrin however patient would like to continue current medications she was previously on Prozac and Lexapro and discontinued both of  those.  She is feels well  on the Fioricet, however she is on propanolol 20 mg by mouth, she is taking 2 tablets in the morning and 1 in the evening. Discussed with her side effects of Fioricet and that this is a controlled substance, would like her to see neurology for work-up and if they are in agreement with continued Fioricet then we can possibly continue but would like her to titrate this down currently to 1 tablet daily and should hear from neurology within 2 weeks to schedule an appointment She has been taking this for the last 2 years from previous provider. She also has Ultram which she reports she only takes 1/month usually if she has not had a refill on this since August 2022  Patient is in agreement with this plan.   Follow-up: Return in about 1 month (around 06/09/2021), or if symptoms worsen or fail to improve, for at any time for any worsening symptoms, Go to Emergency room/ urgent care if worse.    Marcille Buffy, FNP

## 2021-05-09 NOTE — Patient Instructions (Addendum)
CT of head ordered, they will call to schedule and will also call you for neurology office visit. Need to decrease Fioricet medication to one tablet once daily and only PRN. We will need to taper and have you follow up with neurology.  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   Health Maintenance, Female Adopting a healthy lifestyle and getting preventive care are important in promoting health and wellness. Ask your health care provider about: The right schedule for you to have regular tests and exams. Things you can do on your own to prevent diseases and keep yourself healthy. What should I know about diet, weight, and exercise? Eat a healthy diet  Eat a diet that includes plenty of vegetables, fruits, low-fat dairy products, and lean protein. Do not eat a lot of foods that are high in solid fats, added sugars, or sodium. Maintain a healthy weight Body mass index (BMI) is used to identify weight problems. It estimates body fat based on height and weight. Your health care provider can help determine your BMI and help you achieve or maintain a healthy weight. Get regular exercise Get regular exercise. This is one of the most important things you can do for your health. Most adults should: Exercise for at least 150 minutes each week. The exercise should increase your heart rate and make you sweat (moderate-intensity exercise). Do strengthening exercises at least twice a week. This is in addition to the moderate-intensity exercise. Spend less time sitting. Even light physical activity can be beneficial. Watch cholesterol and blood lipids Have your blood tested for lipids and cholesterol at 66 years of age, then have this test every 5 years. Have your cholesterol levels checked more often if: Your lipid or cholesterol levels are high. You are older than 66 years of age. You are at high risk for heart disease. What should I know about cancer screening? Depending  on your health history and family history, you may need to have cancer screening at various ages. This may include screening for: Breast cancer. Cervical cancer. Colorectal cancer. Skin cancer. Lung cancer. What should I know about heart disease, diabetes, and high blood pressure? Blood pressure and heart disease High blood pressure causes heart disease and increases the risk of stroke. This is more likely to develop in people who have high blood pressure readings or are overweight. Have your blood pressure checked: Every 3-5 years if you are 28-14 years of age. Every year if you are 88 years old or older. Diabetes Have regular diabetes screenings. This checks your fasting blood sugar level. Have the screening done: Once every three years after age 49 if you are at a normal weight and have a low risk for diabetes. More often and at a younger age if you are overweight or have a high risk for diabetes. What should I know about preventing infection? Hepatitis B If you have a higher risk for hepatitis B, you should be screened for this virus. Talk with your health care provider to find out if you are at risk for hepatitis B infection. Hepatitis C Testing is recommended for: Everyone born from [birth year] through 1966. Anyone with known risk factors for hepatitis C. Sexually transmitted infections (STIs) Get screened for STIs, including gonorrhea and chlamydia, if: You are sexually active and are younger than 66 years of age. You are older than 66 years of age and your health care provider tells you that you are at risk for this type  of infection. Your sexual activity has changed since you were last screened, and you are at increased risk for chlamydia or gonorrhea. Ask your health care provider if you are at risk. Ask your health care provider about whether you are at high risk for HIV. Your health care provider may recommend a prescription medicine to help prevent HIV infection. If you choose to  take medicine to prevent HIV, you should first get tested for HIV. You should then be tested every 3 months for as long as you are taking the medicine. Pregnancy If you are about to stop having your period (premenopausal) and you may become pregnant, seek counseling before you get pregnant. Take 400 to 800 micrograms (mcg) of folic acid every day if you become pregnant. Ask for birth control (contraception) if you want to prevent pregnancy. Osteoporosis and menopause Osteoporosis is a disease in which the bones lose minerals and strength with aging. This can result in bone fractures. If you are 68 years old or older, or if you are at risk for osteoporosis and fractures, ask your health care provider if you should: Be screened for bone loss. Take a calcium or vitamin D supplement to lower your risk of fractures. Be given hormone replacement therapy (HRT) to treat symptoms of menopause. Follow these instructions at home: Alcohol use Do not drink alcohol if: Your health care provider tells you not to drink. You are pregnant, may be pregnant, or are planning to become pregnant. If you drink alcohol: Limit how much you have to: 0-1 drink a day. Know how much alcohol is in your drink. In the [place] one drink equals one 12 oz bottle of beer (355 mL), one 5 oz glass of wine (148 mL), or one 1 oz glass of hard liquor (44 mL). Lifestyle Do not use any products that contain nicotine or tobacco. These products include cigarettes, chewing tobacco, and vaping devices, such as e-cigarettes. If you need help quitting, ask your health care provider. Do not use street drugs. Do not share needles. Ask your health care provider for help if you need support or information about quitting drugs. General instructions Schedule regular health, dental, and eye exams. Stay current with your vaccines. Tell your health care provider if: You often feel depressed. You have ever been abused or do not feel safe at  home. Summary Adopting a healthy lifestyle and getting preventive care are important in promoting health and wellness. Follow your health care provider's instructions about healthy diet, exercising, and getting tested or screened for diseases. Follow your health care provider's instructions on monitoring your cholesterol and blood pressure. This information is not intended to replace advice given to you by your health care provider. Make sure you discuss any questions you have with your health care provider. Document Revised: 10/15/2020 Document Reviewed: 10/15/2020 Elsevier Patient Education  [place]. Chronic Migraine Headache A migraine is a type of headache that is usually stronger and more sudden than other headaches. Migraines are characterized by an intense pulsing, throbbing pain that is usually only present on one side of the head. Migraine pain usually gets worse with activity. Migraines can cause nausea, vomiting, sensitivity to light and sound, and vision changes. Migraines that keep coming back are called recurrent migraines. A migraine is called a chronic migraine if it happens at least 15 days in a month for more than 3 months. Talk with your health care provider about what things may bring on (trigger) your migraines. What are the causes?  The exact cause of this condition is not known. However, a migraine may be caused when nerves in the brain become irritated and release chemicals that cause inflammation of blood vessels. The inflammation of the blood vessels causes pain. Migraines may be triggered or caused by: Smoking. Certain foods and drinks, such as: Aged cheese. Chocolate. Alcohol. Caffeine. Foods or drinks that contain nitrates, glutamate, aspartame, MSG, or tyramine. Medicines, such as birth control pills or some blood pressure medicines. Other things that may trigger a migraine include: Menstruation. Emotional stress. Lack of sleep or too much  sleep. Tiredness (fatigue). Bright lights or loud noises. Odors. Weather changes and high altitude. What increases the risk? The following factors may make you more likely to experience chronic migraine: Having migraines or a family history of migraines. Having a mental health condition, such as depression or anxiety. Having to take a lot of pain medicine. Having sleep problems. Having heart disease, diabetes, or obesity. What are the signs or symptoms? Symptoms of a migraine vary for each person and may include: Pulsating or throbbing pain. Pain that is usually only present on one side of the head. In some cases, the pain may be on both sides of the head or around the head or neck. Severe pain that prevents you from doing daily activities. Pain that gets worse with physical activity. Nausea, vomiting, or both. Pain with exposure to bright lights, loud noises, or activity. General sensitivity to bright lights, loud noises, or smells. Dizziness. A sign that a migraine is becoming chronic is an increasing number of migraine episodes. It is considered chronic if the migraine happens at least 15 days in a month for more than 3 months. How is this diagnosed? This condition is often diagnosed based on: Your symptoms and medical history. A physical exam. You may also have tests, including: A CT scan or an MRI of your brain. These imaging tests cannot diagnose migraines, but they can help to rule out other causes of headaches. Taking fluid from the spine (lumbar puncture) and analyzing it (cerebrospinal fluid analysis, or CSF analysis). Blood tests. How is this treated? This condition is treated with: Medicines. These help to: Lessen pain and nausea. Prevent migraines. Lifestyle changes, such as changes to your diet or sleeping patterns. Behavior therapy. This may include: Relaxation training. Biofeedback. This is a treatment that teaches you to relax and use your brain to lower your  heart rate and control your breathing. Cognitive behavioral therapy (CBT). This is a form of talk therapy. This therapy helps you set goals and follow up on the changes that you make. Acupuncture. Using a device that provides electrical stimulation to your nerves, which can relieve pain (neuromodulation therapy). Surgery, if the other treatments are not working. Follow these instructions at home: Medicines Take over-the-counter and prescription medicines only as told by your health care provider. Ask your health care provider if the medicine prescribed to you requires you to avoid driving or using machinery. Lifestyle  Do not use any products that contain nicotine or tobacco, such as cigarettes, e-cigarettes, and chewing tobacco. If you need help quitting, ask your health care provider. Do not drink alcohol. Get 7-9 hours of sleep each night, or the amount of sleep recommended by your health care provider. Find ways to manage stress, such as meditation, deep breathing, or yoga. Maintain a healthy weight. If you need help losing weight, ask your health care provider. Exercise regularly. Aim for 150 minutes of moderate-intensity exercise, such as walking, biking,  or yoga, or 75 minutes of vigorous exercise each week. Vigorous exercise includes running, circuit training, and swimming. General instructions  Keep a journal to find out what triggers your migraines so you can avoid these triggers. For example, write down: What you eat and drink. How much sleep you get. Any change to your diet or medicines. Lie down in a dark, quiet room when you have a migraine. Try placing a cool towel over your head when you have a migraine. Keep lights dim, if bright lights bother you or make your migraines worse. Keep all follow-up visits as told by your health care provider. This is important. Where to find more information Coalition for Headache and Migraine Patients (CHAMP): headachemigraine.org American  Migraine Foundation: americanmigrainefoundation.org National Headache Foundation: headaches.org Contact a health care provider if: Your pain does not improve, even with medicine. Your migraines continue to return, even with medicine. Get help right away if: Your migraine becomes severe and medicine does not help. You have a stiff neck and fever. You have a loss of vision. You have muscle weakness or loss of muscle control. You start losing your balance, or you have trouble walking. You feel like you may faint, or you faint. You start having sudden and unexpected, severe headaches. You have a seizure. Summary Migraine headaches are usually stronger and more sudden than other headaches. Migraines are characterized by an intense pulsing, throbbing pain that is usually only present on one side of the head. Migraines that keep coming back are called recurrent migraines. A migraine is called a chronic migraine if it happens 15 days in a month for more than 3 months. Certain things may trigger migraines, such as lack of sleep or too much sleep, smoking, certain foods, alcohol, stress, and certain medicines. Your treatment plan may include medicines, lifestyle changes, and behavior therapy. This information is not intended to replace advice given to you by your health care provider. Make sure you discuss any questions you have with your health care provider. Document Revised: 07/13/2019 Document Reviewed: 07/13/2019 Elsevier Patient Education  [place].

## 2021-05-10 LAB — CBC WITH DIFFERENTIAL/PLATELET
Basophils Absolute: 0 10*3/uL (ref 0.0–0.2)
Basos: 1 %
EOS (ABSOLUTE): 0.1 10*3/uL (ref 0.0–0.4)
Eos: 3 %
Hematocrit: 38 % (ref 34.0–46.6)
Hemoglobin: 13.2 g/dL (ref 11.1–15.9)
Immature Grans (Abs): 0 10*3/uL (ref 0.0–0.1)
Immature Granulocytes: 0 %
Lymphocytes Absolute: 1.2 10*3/uL (ref 0.7–3.1)
Lymphs: 39 %
MCH: 33 pg (ref 26.6–33.0)
MCHC: 34.7 g/dL (ref 31.5–35.7)
MCV: 95 fL (ref 79–97)
Monocytes Absolute: 0.4 10*3/uL (ref 0.1–0.9)
Monocytes: 12 %
Neutrophils Absolute: 1.4 10*3/uL (ref 1.4–7.0)
Neutrophils: 45 %
Platelets: 165 10*3/uL (ref 150–450)
RBC: 4 x10E6/uL (ref 3.77–5.28)
RDW: 12.2 % (ref 11.7–15.4)
WBC: 3 10*3/uL — ABNORMAL LOW (ref 3.4–10.8)

## 2021-05-10 LAB — COMPREHENSIVE METABOLIC PANEL
ALT: 28 IU/L (ref 0–32)
AST: 33 IU/L (ref 0–40)
Albumin/Globulin Ratio: 2.4 — ABNORMAL HIGH (ref 1.2–2.2)
Albumin: 4.4 g/dL (ref 3.8–4.8)
Alkaline Phosphatase: 79 IU/L (ref 44–121)
BUN/Creatinine Ratio: 14 (ref 12–28)
BUN: 12 mg/dL (ref 8–27)
Bilirubin Total: 0.4 mg/dL (ref 0.0–1.2)
CO2: 25 mmol/L (ref 20–29)
Calcium: 9.7 mg/dL (ref 8.7–10.3)
Chloride: 102 mmol/L (ref 96–106)
Creatinine, Ser: 0.85 mg/dL (ref 0.57–1.00)
Globulin, Total: 1.8 g/dL (ref 1.5–4.5)
Glucose: 97 mg/dL (ref 70–99)
Potassium: 4.2 mmol/L (ref 3.5–5.2)
Sodium: 142 mmol/L (ref 134–144)
Total Protein: 6.2 g/dL (ref 6.0–8.5)
eGFR: 76 mL/min/{1.73_m2} (ref >59)

## 2021-05-10 LAB — HEMOGLOBIN A1C
Est. average glucose Bld gHb Est-mCnc: 111 mg/dL
Hgb A1c MFr Bld: 5.5 % (ref 4.8–5.6)

## 2021-05-10 LAB — MICROALBUMIN / CREATININE URINE RATIO
Creatinine, Urine: 89.8 mg/dL
Microalb/Creat Ratio: 13 mg/g creat (ref 0–29)
Microalbumin, Urine: 11.3 ug/mL

## 2021-05-10 LAB — VITAMIN D 25 HYDROXY (VIT D DEFICIENCY, FRACTURES): Vit D, 25-Hydroxy: 34.2 ng/mL (ref 30.0–100.0)

## 2021-05-10 LAB — TSH: TSH: 2.14 u[IU]/mL (ref 0.450–4.500)

## 2021-07-28 ENCOUNTER — Other Ambulatory Visit: Payer: Self-pay | Admitting: Adult Health

## 2022-01-17 ENCOUNTER — Ambulatory Visit
Admission: EM | Admit: 2022-01-17 | Discharge: 2022-01-17 | Disposition: A | Discharge status: Home / Self Care | Payer: Managed Care, Other (non HMO)

## 2022-01-17 DIAGNOSIS — J0110 Acute frontal sinusitis, unspecified: Secondary | ICD-10-CM | POA: Diagnosis not present

## 2022-01-17 MED ORDER — AMOXICILLIN 875 MG PO TABS: 875.0000 mg | ORAL_TABLET | Freq: Two times a day (BID) | ORAL | 0 refills | Status: AC

## 2022-01-17 NOTE — ED Provider Notes (Signed)
Roderic Palau    CSN: [id number] Arrival date & time: 01/17/22  1608      History   Chief Complaint Chief Complaint  Patient presents with   Headache    HPI Krystal Walker is a 66 y.o. female.  Patient presents with 1 week history of sinus congestion, sinus pressure, headache, postnasal drip, productive cough.  Treating symptoms at home with OTC sinus medication and eucalyptus to penicillin.  She reports low-grade fever of 99 at the onset of her symptoms but none since.  No sore throat, shortness of breath, chest pain, vomiting, diarrhea, or other symptoms.  Her medical history includes hypertension, hyperlipidemia, GERD, IBS, salivary gland cancer.  The history is provided by the patient and medical records.    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   Osteoporosis 04/20/2017   Frequent headaches 08/13/2015   Raynaud phenomenon 04/12/2012   IBS (irritable bowel syndrome) 04/12/2012   Essential hypertension    Hyperlipidemia     Past Surgical History:  Procedure Laterality Date   ABDOMINAL HYSTERECTOMY  2004   partial   BREAST BIOPSY Left 02-23-12   w/clip - benign   COLONOSCOPY WITH PROPOFOL N/A 08/11/2016   Procedure: COLONOSCOPY WITH PROPOFOL;  Surgeon: Jonathon Bellows, MD;  Location: [facility] ENDOSCOPY;  Service: Endoscopy;  Laterality: N/A;   ESOPHAGOGASTRODUODENOSCOPY (EGD) WITH PROPOFOL N/A 08/10/2016   Procedure: ESOPHAGOGASTRODUODENOSCOPY (EGD) WITH PROPOFOL;  Surgeon: Wilford Corner, MD;  Location: [facility] ENDOSCOPY;  Service: Endoscopy;   Laterality: N/A;   PAROTIDECTOMY Right    adenoid cystic carcinoma   SALIVARY GLAND SURGERY  1999    OB History     Gravida  1   Para      Term      Preterm      AB      Living  0      SAB      IAB      Ectopic      Multiple      Live Births           Obstetric Comments  Age first menstrual cycle 44 Age first pregnancy 71 Last menstrual cycle 2004          Home Medications    Prior to Admission medications   Medication Sig Start Date End Date Taking? Authorizing Provider  amoxicillin (AMOXIL) 875 MG tablet Take 1 tablet (875 mg total) by mouth 2 (two) times daily for 7 days. 01/17/22 01/24/22 Yes Sharion Balloon, NP  busPIRone (BUSPAR) 7.5 MG tablet Take 7.5 mg by mouth daily. 01/03/22  Yes [provider]  butalbital-acetaminophen-caffeine (FIORICET) 50-325-40 MG tablet As directed Patient taking differently: Take 1 tablet by mouth 2 (two) times daily. As directed 04/10/21  Yes Kennyth Arnold, FNP  fluticasone (FLONASE) 50 MCG/ACT nasal spray fluticasone propionate 50 mcg/actuation nasal spray,suspension    [provider]  Multiple Vitamin (MULTIVITAMIN WITH MINERALS) TABS tablet Take 1 tablet by mouth daily.  [provider]  Multiple Vitamins-Minerals (OCUVITE ADULT 50+ PO) Take 1 tablet by mouth daily. 02/15/21   [provider]  propranolol (INDERAL) 20 MG tablet Take 1 tablet (20 mg total) by mouth 2 (two) times daily. Patient taking differently: Take 20 mg by mouth 2 (two) times daily. Take one tablet in the morning and .5 tablets on the evening. 11/07/20   Flinchum, Kelby Aline, FNP  traMADol (ULTRAM) 50 MG tablet Take 1 tablet (50 mg total) by mouth every 6 (six) hours as needed. 01/22/21   Rodriguez-Southworth, Sunday Spillers, PA-C  traZODone (DESYREL) 50 MG tablet Take 50 mg by mouth daily. 12/26/21   [provider]    Family History Family History  Problem Relation Age of Onset   Arrhythmia Mother         pacemaker   Fainting Mother    Hypertension Mother    Hyperlipidemia Mother    Breast cancer Neg Hx     Social History Social History   Tobacco Use   Smoking status: Former    Packs/day: 1.00    Years: 15.00    Total pack years: 15.00    Types: Cigarettes   Smokeless tobacco: Never   Tobacco comments:    quit 1986  Vaping Use   Vaping Use: Never used  Substance Use Topics   Alcohol use: No    Alcohol/week: 0.0 standard drinks of alcohol   Drug use: No     Allergies   Morphine, Nickel, Other, Latex, and Morphine and related   Review of Systems Review of Systems  Constitutional:  Negative for chills and fever.  HENT:  Positive for congestion, postnasal drip, rhinorrhea and sinus pressure. Negative for ear pain and sore throat.   Respiratory:  Positive for cough. Negative for shortness of breath.   Cardiovascular:  Negative for chest pain and palpitations.  Gastrointestinal:  Negative for diarrhea and vomiting.  Skin:  Negative for color change and rash.  All other systems reviewed and are negative.    Physical Exam Triage Vital Signs ED Triage Vitals  Enc Vitals Group     BP      Pulse      Resp      Temp      Temp src      SpO2      Weight      Height      Head Circumference      Peak Flow      Pain Score      Pain Loc      Pain Edu?      Excl. in [place]?    No data found.  Updated Vital Signs BP (!) 132/92   Pulse 90   Temp 98.4 F (36.9 C)   Resp 18   Ht '5\' 4"'$  (1.626 m)   Wt 142 lb (64.4 kg)   SpO2 97%   BMI 24.37 kg/m   Visual Acuity Right Eye Distance:   Left Eye Distance:   Bilateral Distance:    Right Eye Near:   Left Eye Near:    Bilateral Near:     Physical Exam Vitals and nursing note reviewed.  Constitutional:      General: She is not in acute distress.    Appearance: Normal appearance. She is well-developed. She is not ill-appearing.  HENT:     Right Ear: Tympanic membrane normal.     Left Ear: Tympanic membrane  normal.     Nose: Nose normal.  Mouth/Throat:     Mouth: Mucous membranes are moist.     Pharynx: Oropharynx is clear.  Cardiovascular:     Rate and Rhythm: Normal rate and regular rhythm.     Heart sounds: Normal heart sounds.  Pulmonary:     Effort: Pulmonary effort is normal. No respiratory distress.     Breath sounds: Normal breath sounds.  Musculoskeletal:     Cervical back: Neck supple. No rigidity.  Skin:    General: Skin is warm and dry.  Neurological:     Mental Status: She is alert.  Psychiatric:        Mood and Affect: Mood normal.        Behavior: Behavior normal.      UC Treatments / Results  Labs (all labs ordered are listed, but only abnormal results are displayed) Labs Reviewed - No data to display  EKG   Radiology No results found.  Procedures Procedures (including critical care time)  Medications Ordered in UC Medications - No data to display  Initial Impression / Assessment and Plan / UC Course  I have reviewed the triage vital signs and the nursing notes.  Pertinent labs & imaging results that were available during my care of the patient were reviewed by me and considered in my medical decision making (see chart for details).    Acute sinusitis.  Patient has been symptomatic for 1 week and is not improving with OTC treatment.  Afebrile and vital signs are stable.  Treating with amoxicillin.  Instructed patient to follow up with her PCP if her symptoms are not improving.  She agrees to plan of care.    Final Clinical Impressions(s) / UC Diagnoses   Final diagnoses:  Acute non-recurrent frontal sinusitis     Discharge Instructions      Take the amoxicillin as directed.  Follow up with your primary care provider if your symptoms are not improving.        ED Prescriptions     Medication Sig Dispense Auth. Provider   amoxicillin (AMOXIL) 875 MG tablet Take 1 tablet (875 mg total) by mouth 2 (two) times daily for 7 days. 14 tablet  Sharion Balloon, NP      PDMP not reviewed this encounter.   Sharion Balloon, NP 01/17/22 1650

## 2022-01-17 NOTE — ED Triage Notes (Signed)
Patient to urgent care with complaints of headache x1 week. Reports neck stiffness. Patient has been using "severe allery plus sinus headache", vicks vapor rub and using a eucalyptus diffuser without relief.

## 2022-01-17 NOTE — Discharge Instructions (Addendum)
Take the amoxicillin as directed.  Follow up with your primary care provider if your symptoms are not improving.   ° ° °

## 2022-02-09 ENCOUNTER — Ambulatory Visit
Admission: EM | Admit: 2022-02-09 | Discharge: 2022-02-09 | Disposition: A | Discharge status: Home / Self Care | Payer: Managed Care, Other (non HMO) | Attending: Emergency Medicine | Admitting: Emergency Medicine

## 2022-02-09 DIAGNOSIS — N3001 Acute cystitis with hematuria: Secondary | ICD-10-CM | POA: Diagnosis not present

## 2022-02-09 LAB — POCT URINALYSIS DIP (MANUAL ENTRY)
Bilirubin, UA: NEGATIVE
Glucose, UA: NEGATIVE mg/dL
Ketones, POC UA: NEGATIVE mg/dL
Nitrite, UA: POSITIVE — AB
Protein Ur, POC: >=300 mg/dL — AB
Spec Grav, UA: 1.025 (ref 1.010–1.025)
Urobilinogen, UA: 0.2 E.U./dL
pH, UA: 8.5 — AB (ref 5.0–8.0)

## 2022-02-09 MED ORDER — SULFAMETHOXAZOLE-TRIMETHOPRIM 800-160 MG PO TABS: 1.0000 | ORAL_TABLET | Freq: Two times a day (BID) | ORAL | 0 refills | Status: AC

## 2022-02-09 NOTE — Discharge Instructions (Addendum)
You were advised to begin antibiotics today because your urinalysis is abnormal and you are having active symptoms of an acute lower urinary tract infection also known as cystitis.  It is very important that you take all doses exactly as prescribed.  Incomplete antibiotic therapy can cause worsening urinary tract infection that can become aggressive, escape from urinary tract into your bloodstream causing sepsis which will require hospitalization.   Please pick up and begin taking your prescription for Bactrim DS (trimethoprim sulfamethoxazole) as soon as possible.  The pharmacy you have chosen, [facility], opens at 9 AM.    Please take all doses exactly as prescribed.  You can take this medication with or without food.  This medication is safe to take with your other medications.   If you have not had complete resolution of your symptoms after completing treatment as prescribed, please return to urgent care for repeat evaluation or follow-up with your primary care provider.   Thank you for visiting urgent care today.  I appreciate the opportunity to participate in your care.

## 2022-02-09 NOTE — ED Provider Notes (Signed)
Krystal Walker    CSN: [id number] Arrival date & time: 02/09/22  0815    HISTORY   Chief Complaint  Patient presents with   Urinary Frequency   HPI Krystal Walker is a pleasant, 66 y.o. female who presents to urgent care today. Pt c/o painful and frequent urination, and odor that started yesterday.  Patient endorses abnormal odor of urine, burning with urination, increased frequency of urination, and increased urge to urinate.  Patient denies sensation of incomplete emptying, suprapubic pain, perineal pain, incontinence of urine, altered mental status, left-sided flank pain, right-sided flank pain, fever, chills, malaise, rigors, significant fatigue, abnormal vaginal discharge, dyspareunia, and known exposure to STD.  Home interventions include bathing and drinking green tea, states neither have improved her symptoms.  The history is provided by the patient.   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   Osteoporosis 04/20/2017   Frequent headaches 08/13/2015   Raynaud phenomenon 04/12/2012   IBS (irritable bowel syndrome) 04/12/2012   Essential hypertension    Hyperlipidemia    Past Surgical History:  Procedure Laterality Date   ABDOMINAL HYSTERECTOMY  2004   partial   BREAST BIOPSY Left 02-23-12   w/clip - benign   COLONOSCOPY WITH PROPOFOL N/A 08/11/2016   Procedure: COLONOSCOPY WITH PROPOFOL;  Surgeon: Jonathon Bellows, MD;  Location: [facility] ENDOSCOPY;  Service: Endoscopy;  Laterality: N/A;   ESOPHAGOGASTRODUODENOSCOPY (EGD) WITH PROPOFOL N/A 08/10/2016    Procedure: ESOPHAGOGASTRODUODENOSCOPY (EGD) WITH PROPOFOL;  Surgeon: Wilford Corner, MD;  Location: [facility] ENDOSCOPY;  Service: Endoscopy;  Laterality: N/A;   PAROTIDECTOMY Right    adenoid cystic carcinoma   SALIVARY GLAND SURGERY  1999   OB History     Gravida  1   Para      Term      Preterm      AB      Living  0      SAB      IAB      Ectopic      Multiple      Live Births           Obstetric Comments  Age first menstrual cycle 1 Age first pregnancy 4 Last menstrual cycle 2004        Home Medications    Prior to Admission medications   Medication Sig Start Date End Date Taking? Authorizing Provider  busPIRone (BUSPAR) 7.5 MG tablet Take 7.5 mg by mouth daily. 01/03/22   [provider]  butalbital-acetaminophen-caffeine (FIORICET) 50-325-40 MG tablet As directed Patient taking differently: Take 1 tablet by mouth 2 (two) times daily. As directed 04/10/21   Kennyth Arnold, FNP  fluticasone (FLONASE) 50 MCG/ACT nasal spray fluticasone propionate 50 mcg/actuation nasal spray,suspension    [provider]  Multiple Vitamin (MULTIVITAMIN WITH MINERALS) TABS tablet Take 1 tablet by mouth daily.    [provider]  Multiple Vitamins-Minerals (OCUVITE ADULT 50+ PO) Take 1 tablet by mouth daily. 02/15/21   [provider]  propranolol (INDERAL) 20 MG tablet Take 1 tablet (20 mg total) by mouth 2 (two) times daily. Patient taking differently: Take 20 mg by mouth 2 (two) times daily. Take one tablet in the morning and .5 tablets on the evening. 11/07/20   Flinchum, Kelby Aline, FNP  traMADol (ULTRAM) 50 MG tablet Take 1 tablet (50 mg total) by mouth every 6 (six) hours as needed. 01/22/21   Rodriguez-Southworth, Sunday Spillers, PA-C  traZODone (DESYREL) 50 MG tablet Take 50 mg by mouth daily. 12/26/21   [provider]    Family History Family History  Problem Relation Age of Onset   Arrhythmia Mother        pacemaker   Fainting  Mother    Hypertension Mother    Hyperlipidemia Mother    Breast cancer Neg Hx    Social History Social History   Tobacco Use   Smoking status: Former    Packs/day: 1.00    Years: 15.00    Total pack years: 15.00    Types: Cigarettes   Smokeless tobacco: Never   Tobacco comments:    quit 1986  Vaping Use   Vaping Use: Never used  Substance Use Topics   Alcohol use: No    Alcohol/week: 0.0 standard drinks of alcohol   Drug use: No   Allergies   Morphine, Nickel, Other, Amoxicillin, Latex, and Morphine and related  Review of Systems Review of Systems Pertinent findings revealed after performing a 14 point review of systems has been noted in the history of present illness.  Physical Exam Triage Vital Signs ED Triage Vitals  Enc Vitals Group     BP 04/05/21 0827 (!) 147/82     Pulse Rate 04/05/21 0827 72     Resp 04/05/21 0827 18     Temp 04/05/21 0827 98.3 F (36.8 C)     Temp Source 04/05/21 0827 Oral     SpO2 04/05/21 0827 98 %     Weight --      Height --      Head Circumference --      Peak Flow --      Pain Score 04/05/21 0826 5     Pain Loc --      Pain Edu? --      Excl. in [place]? --   No data found.  Updated Vital Signs BP 138/83 (BP Location: Left Arm)   Pulse 84   Temp 97.7 F (36.5 C) (Oral)   Resp 16   SpO2 98%   Physical Exam Vitals and nursing note reviewed.  Constitutional:      General: She is not in acute distress.    Appearance: Normal appearance. She is not ill-appearing.  HENT:     Head: Normocephalic and atraumatic.  Eyes:     General: Lids are normal.        Right eye: No discharge.        Left eye: No discharge.     Extraocular Movements: Extraocular movements intact.     Conjunctiva/sclera: Conjunctivae normal.     Right eye: Right conjunctiva is not injected.     Left eye: Left conjunctiva is not injected.  Neck:     Trachea: Trachea and phonation normal.  Cardiovascular:     Rate and Rhythm: Normal rate and regular  rhythm.     Pulses: Normal pulses.     Heart sounds: Normal heart sounds. No murmur heard.    No friction rub. No gallop.  Pulmonary:  Effort: Pulmonary effort is normal. No accessory muscle usage, prolonged expiration or respiratory distress.     Breath sounds: Normal breath sounds. No stridor, decreased air movement or transmitted upper airway sounds. No decreased breath sounds, wheezing, rhonchi or rales.  Chest:     Chest wall: No tenderness.  Abdominal:     General: Abdomen is flat. Bowel sounds are normal. There is no distension.     Palpations: Abdomen is soft.     Tenderness: There is no abdominal tenderness. There is no right CVA tenderness or left CVA tenderness.     Hernia: No hernia is present.  Musculoskeletal:        General: Normal range of motion.     Cervical back: Normal range of motion and neck supple. Normal range of motion.  Lymphadenopathy:     Cervical: No cervical adenopathy.  Skin:    General: Skin is warm and dry.     Findings: No erythema or rash.  Neurological:     General: No focal deficit present.     Mental Status: She is alert and oriented to person, place, and time.  Psychiatric:        Mood and Affect: Mood normal.        Behavior: Behavior normal.     Visual Acuity Right Eye Distance:   Left Eye Distance:   Bilateral Distance:    Right Eye Near:   Left Eye Near:    Bilateral Near:     UC Couse / Diagnostics / Procedures:     Radiology No results found.  Procedures Procedures (including critical care time) EKG  Pending results:  Labs Reviewed  POCT URINALYSIS DIP (MANUAL ENTRY) - Abnormal; Notable for the following components:      Result Value   Color, UA brown (*)    Clarity, UA cloudy (*)    Blood, UA large (*)    pH, UA 8.5 (*)    Protein Ur, POC >=300 (*)    Nitrite, UA Positive (*)    Leukocytes, UA Small (1+) (*)    All other components within normal limits    Medications Ordered in UC: Medications - No data  to display  UC Diagnoses / Final Clinical Impressions(s)   I have reviewed the triage vital signs and the nursing notes.  Pertinent labs & imaging results that were available during my care of the patient were reviewed by me and considered in my medical decision making (see chart for details).    Final diagnoses:  Acute cystitis with hematuria   Patient was advised to begin antibiotics now due to findings on urine dip. Patient was advised to begin antibiotics today due to having active symptoms of urinary tract infection.                    Patient was advised to take all doses exactly as prescribed.  Patient also advised of risks of worsening infection with incomplete antibiotic therapy. Return precautions advised.  ED Prescriptions     Medication Sig Dispense Auth. Provider   sulfamethoxazole-trimethoprim (BACTRIM DS) 800-160 MG tablet Take 1 tablet by mouth 2 (two) times daily for 3 days. 6 tablet Lynden Oxford Scales, PA-C      PDMP not reviewed this encounter.  Disposition Upon Discharge:  Condition: stable for discharge home  Patient presented with concern for an acute illness with associated systemic symptoms and significant discomfort requiring urgent management. In my opinion, this is a condition that a prudent  lay person (someone who possesses an average knowledge of health and medicine) may potentially expect to result in complications if not addressed urgently such as respiratory distress, impairment of bodily function or dysfunction of bodily organs.   As such, the patient has been evaluated and assessed, work-up was performed and treatment was provided in alignment with urgent care protocols and evidence based medicine.  Patient/parent/caregiver has been advised that the patient may require follow up for further testing and/or treatment if the symptoms continue in spite of treatment, as clinically indicated and appropriate.  Routine symptom specific, illness specific  and/or disease specific instructions were discussed with the patient and/or caregiver at length.  Prevention strategies for avoiding STD exposure were also discussed.  The patient will follow up with their current PCP if and as advised. If the patient does not currently have a PCP we will assist them in obtaining one.   The patient may need specialty follow up if the symptoms continue, in spite of conservative treatment and management, for further workup, evaluation, consultation and treatment as clinically indicated and appropriate.  Patient/parent/caregiver verbalized understanding and agreement of plan as discussed.  All questions were addressed during visit.  Please see discharge instructions below for further details of plan.  Discharge Instructions:   Discharge Instructions      You were advised to begin antibiotics today because your urinalysis is abnormal and you are having active symptoms of an acute lower urinary tract infection also known as cystitis.  It is very important that you take all doses exactly as prescribed.  Incomplete antibiotic therapy can cause worsening urinary tract infection that can become aggressive, escape from urinary tract into your bloodstream causing sepsis which will require hospitalization.   Please pick up and begin taking your prescription for Bactrim DS (trimethoprim sulfamethoxazole) as soon as possible.  The pharmacy you have chosen, [facility], opens at 9 AM.    Please take all doses exactly as prescribed.  You can take this medication with or without food.  This medication is safe to take with your other medications.   If you have not had complete resolution of your symptoms after completing treatment as prescribed, please return to urgent care for repeat evaluation or follow-up with your primary care provider.   Thank you for visiting urgent care today.  I appreciate the opportunity to participate in your care.       This office note has been  dictated using Museum/gallery curator.  Unfortunately, this method of dictation can sometimes lead to typographical or grammatical errors.  I apologize for your inconvenience in advance if this occurs.  Please do not hesitate to reach out to me if clarification is needed.       Lynden Oxford Scales, [place] 02/09/22 [phone]

## 2022-02-09 NOTE — ED Triage Notes (Signed)
Pt c/o painful and frequent urination, and odor that started yesterday.   Home intervention: bathing, green tea

## 2022-02-17 ENCOUNTER — Encounter: Payer: Self-pay | Admitting: Gastroenterology

## 2022-02-20 ENCOUNTER — Other Ambulatory Visit: Payer: Self-pay

## 2022-02-20 DIAGNOSIS — R197 Diarrhea, unspecified: Secondary | ICD-10-CM

## 2022-03-03 ENCOUNTER — Other Ambulatory Visit: Payer: Self-pay | Admitting: Family Medicine

## 2022-03-03 DIAGNOSIS — Z1231 Encounter for screening mammogram for malignant neoplasm of breast: Secondary | ICD-10-CM

## 2022-03-31 ENCOUNTER — Ambulatory Visit
Admission: RE | Admit: 2022-03-31 | Discharge: 2022-03-31 | Disposition: A | Discharge status: Home / Self Care | Payer: Managed Care, Other (non HMO) | Source: Ambulatory Visit | Attending: Family Medicine | Admitting: Family Medicine

## 2022-03-31 DIAGNOSIS — Z1231 Encounter for screening mammogram for malignant neoplasm of breast: Secondary | ICD-10-CM | POA: Diagnosis present

## 2022-05-13 ENCOUNTER — Emergency Department: Payer: Managed Care, Other (non HMO)

## 2022-05-13 ENCOUNTER — Emergency Department
Admission: EM | Admit: 2022-05-13 | Discharge: 2022-05-13 | Disposition: A | Discharge status: Home / Self Care | Payer: Managed Care, Other (non HMO) | Attending: Emergency Medicine | Admitting: Emergency Medicine

## 2022-05-13 ENCOUNTER — Other Ambulatory Visit: Payer: Self-pay

## 2022-05-13 ENCOUNTER — Encounter: Payer: Self-pay | Admitting: Emergency Medicine

## 2022-05-13 DIAGNOSIS — Z1152 Encounter for screening for COVID-19: Secondary | ICD-10-CM | POA: Insufficient documentation

## 2022-05-13 DIAGNOSIS — K5792 Diverticulitis of intestine, part unspecified, without perforation or abscess without bleeding: Secondary | ICD-10-CM

## 2022-05-13 DIAGNOSIS — R109 Unspecified abdominal pain: Secondary | ICD-10-CM | POA: Diagnosis present

## 2022-05-13 DIAGNOSIS — R197 Diarrhea, unspecified: Secondary | ICD-10-CM | POA: Diagnosis not present

## 2022-05-13 DIAGNOSIS — I10 Essential (primary) hypertension: Secondary | ICD-10-CM | POA: Insufficient documentation

## 2022-05-13 LAB — COMPREHENSIVE METABOLIC PANEL
ALT: 150 U/L — ABNORMAL HIGH (ref 0–44)
AST: 184 U/L — ABNORMAL HIGH (ref 15–41)
Albumin: 3.9 g/dL (ref 3.5–5.0)
Alkaline Phosphatase: 122 U/L (ref 38–126)
Anion gap: 11 (ref 5–15)
BUN: 13 mg/dL (ref 8–23)
CO2: 25 mmol/L (ref 22–32)
Calcium: 9.5 mg/dL (ref 8.9–10.3)
Chloride: 102 mmol/L (ref 98–111)
Creatinine, Ser: 0.74 mg/dL (ref 0.44–1.00)
GFR, Estimated: >60 mL/min (ref >60)
Glucose, Bld: 130 mg/dL — ABNORMAL HIGH (ref 70–99)
Potassium: 4.1 mmol/L (ref 3.5–5.1)
Sodium: 138 mmol/L (ref 135–145)
Total Bilirubin: 0.8 mg/dL (ref 0.3–1.2)
Total Protein: 7.2 g/dL (ref 6.5–8.1)

## 2022-05-13 LAB — CBC WITH DIFFERENTIAL/PLATELET
Abs Immature Granulocytes: 0.03 10*3/uL (ref 0.00–0.07)
Basophils Absolute: 0 10*3/uL (ref 0.0–0.1)
Basophils Relative: 0 %
Eosinophils Absolute: 0 10*3/uL (ref 0.0–0.5)
Eosinophils Relative: 0 %
HCT: 39.7 % (ref 36.0–46.0)
Hemoglobin: 13.6 g/dL (ref 12.0–15.0)
Immature Granulocytes: 0 %
Lymphocytes Relative: 14 %
Lymphs Abs: 1.1 10*3/uL (ref 0.7–4.0)
MCH: 32.4 pg (ref 26.0–34.0)
MCHC: 34.3 g/dL (ref 30.0–36.0)
MCV: 94.5 fL (ref 80.0–100.0)
Monocytes Absolute: 0.6 10*3/uL (ref 0.1–1.0)
Monocytes Relative: 8 %
Neutro Abs: 5.9 10*3/uL (ref 1.7–7.7)
Neutrophils Relative %: 78 %
Platelets: 165 10*3/uL (ref 150–400)
RBC: 4.2 MIL/uL (ref 3.87–5.11)
RDW: 11.9 % (ref 11.5–15.5)
WBC: 7.7 10*3/uL (ref 4.0–10.5)
nRBC: 0 % (ref 0.0–0.2)

## 2022-05-13 LAB — URINALYSIS, ROUTINE W REFLEX MICROSCOPIC
Bacteria, UA: NONE SEEN
Bilirubin Urine: NEGATIVE
Glucose, UA: NEGATIVE mg/dL
Ketones, ur: NEGATIVE mg/dL
Leukocytes,Ua: NEGATIVE
Nitrite: NEGATIVE
Protein, ur: NEGATIVE mg/dL
Specific Gravity, Urine: 1.017 (ref 1.005–1.030)
pH: 7 (ref 5.0–8.0)

## 2022-05-13 LAB — RESP PANEL BY RT-PCR (FLU A&B, COVID) ARPGX2
Influenza A by PCR: NEGATIVE
Influenza B by PCR: NEGATIVE
SARS Coronavirus 2 by RT PCR: NEGATIVE

## 2022-05-13 LAB — LIPASE, BLOOD: Lipase: 34 U/L (ref 11–51)

## 2022-05-13 MED ORDER — IOHEXOL 300 MG/ML  SOLN
100.0000 mL | Freq: Once | INTRAMUSCULAR | Status: AC | PRN
  Administered 2022-05-13: 100 mL via INTRAVENOUS

## 2022-05-13 MED ORDER — CIPROFLOXACIN HCL 500 MG PO TABS: 500.0000 mg | ORAL_TABLET | Freq: Two times a day (BID) | ORAL | 0 refills | Status: AC

## 2022-05-13 MED ORDER — METRONIDAZOLE 500 MG PO TABS: 500.0000 mg | ORAL_TABLET | Freq: Two times a day (BID) | ORAL | 0 refills | Status: AC

## 2022-05-13 MED ORDER — SODIUM CHLORIDE 0.9 % IV BOLUS
1000.0000 mL | Freq: Once | INTRAVENOUS | Status: AC
  Administered 2022-05-13: 1000 mL via INTRAVENOUS

## 2022-05-13 NOTE — Discharge Instructions (Addendum)
It is very importantly follow-up with Dr. Lysle Pearl next week.  He is expecting your phone call to schedule this appointment.  In the meantime take the antibiotics to treat your diverticulitis.  Please return to the emergency department if you develop fever, worsening pain, or any other concerns.  You may continue to take Imodium per package instructions as needed for diarrhea.  Please remember to stay hydrated.

## 2022-05-13 NOTE — ED Provider Notes (Signed)
[facility] Provider Note    Event Date/Time   First MD Initiated Contact with Patient 05/13/22 1214     (approximate)   History   Abdominal Pain and Diarrhea   HPI  Krystal Walker is a 66 y.o. female with a past medical history of IBS, hypertension, hyperlipidemia who presents today for evaluation of diarrhea that began 3 to 4 days ago.  She thought that it was from eating pizza.  She tried Pepto-Bismol without improvement of her symptoms.  She has had generalized abdominal pain.  Her diarrhea is nonbloody.  She denies any fevers or chills.  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   Osteoporosis 04/20/2017   Frequent headaches 08/13/2015   Raynaud phenomenon 04/12/2012   IBS (irritable bowel syndrome) 04/12/2012   Essential hypertension    Hyperlipidemia           Physical Exam   Triage Vital Signs: ED Triage Vitals  Enc Vitals Group     BP 05/13/22 1125 (!) 147/108     Pulse Rate 05/13/22 1125 (!) 110     Resp 05/13/22 1125 16     Temp --      Temp src --      SpO2 05/13/22 1125 97 %     Weight 05/13/22 1127 140 lb (63.5 kg)     Height 05/13/22 1127 '5\' 4"'$  (1.626 m)     Head Circumference --      Peak Flow --      Pain Score 05/13/22 1126 0     Pain Loc --      Pain Edu? --      Excl. in [place]? --     Most recent vital signs: Vitals:   05/13/22 1440 05/13/22 1537  BP: (!) 140/90 138/88  Pulse: 99 88  Resp: 16 16  Temp:  98 F (36.7 C)  SpO2: 98% 98%    Physical Exam Vitals and nursing note reviewed.  Constitutional:      General: Awake and alert. No acute distress.    Appearance: Normal appearance. The patient is normal weight.  HENT:     Head: Normocephalic and atraumatic.     Mouth: Mucous membranes are moist.  Eyes:     General: PERRL. Normal EOMs        Right eye: No discharge.        Left eye: No discharge.     Conjunctiva/sclera:  Conjunctivae normal.  Cardiovascular:     Rate and Rhythm: Normal rate and regular rhythm.     Pulses: Normal pulses.  Pulmonary:     Effort: Pulmonary effort is normal. No respiratory distress.     Breath sounds: Normal breath sounds.  Abdominal:     Abdomen is soft. There is left and right lower quadrant abdominal tenderness. No rebound or guarding. No distention. Musculoskeletal:        General: No swelling. Normal range of motion.     Cervical back: Normal range of motion and neck supple.  Skin:    General: Skin is warm and dry.     Capillary Refill: Capillary refill takes less than 2 seconds.     Findings: No rash.  Neurological:     Mental Status: The patient is awake and alert.      ED Results / Procedures / Treatments   Labs (all labs ordered are listed, but only  abnormal results are displayed) Labs Reviewed  COMPREHENSIVE METABOLIC PANEL - Abnormal; Notable for the following components:      Result Value   Glucose, Bld 130 (*)    AST 184 (*)    ALT 150 (*)    All other components within normal limits  URINALYSIS, ROUTINE W REFLEX MICROSCOPIC - Abnormal; Notable for the following components:   Color, Urine STRAW (*)    APPearance CLEAR (*)    Hgb urine dipstick SMALL (*)    All other components within normal limits  RESP PANEL BY RT-PCR (FLU A&B, COVID) ARPGX2  STOOL CULTURE  C DIFFICILE QUICK SCREEN W PCR REFLEX    LIPASE, BLOOD  CBC WITH DIFFERENTIAL/PLATELET     EKG     RADIOLOGY I independently reviewed and interpreted imaging and agree with radiologists findings.     PROCEDURES:  Critical Care performed:   Procedures   MEDICATIONS ORDERED IN ED: Medications  sodium chloride 0.9 % bolus 1,000 mL (0 mLs Intravenous Stopped 05/13/22 1536)  iohexol (OMNIPAQUE) 300 MG/ML solution 100 mL (100 mLs Intravenous Contrast Given 05/13/22 1402)     IMPRESSION / MDM / ASSESSMENT AND PLAN / ED COURSE  I reviewed the triage vital signs and the nursing  notes.   Differential diagnosis includes, but is not limited to, gastroenteritis, diverticulitis, appendicitis, dehydration, electrolyte disarray.  Patient is tachycardic on arrival, though normotensive and afebrile.  She has tenderness to palpation to her left and right lower quadrants, but no rebound or guarding.  Labs obtained are overall reassuring.  CT scan obtained demonstrates diverticulitis at the rectosigmoid colon with a small abscess.  These results were discussed with Dr. Lysle Pearl who reviewed the images as well, and does not feel that the patient requires admission to the hospital.  He feels that outpatient treatment is sufficient for her.  He recommends follow-up in his clinic, and I discussed this with the patient who reports that she will call his office to make this appointment.  In the meantime, she was written on Cipro and Flagyl given that she has an allergy to Augmentin.  We discussed all of her results and findings and I recommended close outpatient follow-up and strict return precautions.  Also advised that she should continue hydration given her diarrhea.  Dr. Lysle Pearl also reports that she may take Imodium, which I relayed to her.  She was also noted to have an elevated AST and ALT, and I advised her that she needs to have this rechecked to ensure that goes back to normal.  There are no hepatobiliary abnormalities noted on her CAT scan.  Patient understands and agrees with all recommendations.  She was discharged in stable condition.   Patient's presentation is most consistent with acute complicated illness / injury requiring diagnostic workup.   Clinical Course as of 05/13/22 1605  Tue May 13, 2022  1504 Acute diverticulitis at the rectosigmoid junction with a possible small intramural abscess measuring 1.6 x 1.0 cm. No evidence of pneumoperitoneum.   [JP]  1962 Discussed with Dr. Lysle Pearl who feels that she is safe to go home with oral antibiotics.  He will follow-up with her in  clinic next week. [JP]    Clinical Course User Index [JP] Lawerence Dery, Clarnce Flock, PA-C     FINAL CLINICAL IMPRESSION(S) / ED DIAGNOSES   Final diagnoses:  Diarrhea, unspecified type  Diverticulitis     Rx / DC Orders   ED Discharge Orders  Ordered    ciprofloxacin (CIPRO) 500 MG tablet  2 times daily        05/13/22 1547    metroNIDAZOLE (FLAGYL) 500 MG tablet  2 times daily        05/13/22 1547             Note:  This document was prepared using Dragon voice recognition software and may include unintentional dictation errors.   Emeline Gins 05/13/22 1606    Lavonia Drafts, MD 05/17/22 [phone]

## 2022-05-13 NOTE — ED Provider Triage Note (Signed)
Emergency Medicine Provider Triage Evaluation Note  Krystal Walker , a 66 y.o. female  was evaluated in triage.  Pt complains of diarrhea and possible dehydration..  Review of Systems  Positive:  Negative:   Physical Exam  BP (!) 147/108   Pulse (!) 110   Resp 16   Ht '5\' 4"'$  (1.626 m)   Wt 63.5 kg   SpO2 97%   BMI 24.03 kg/m  Gen:   Awake, no distress   Resp:  Normal effort  MSK:   Moves extremities without difficulty  Other:    Medical Decision Making  Medically screening exam initiated at 11:27 AM.  Appropriate orders placed.  Krystal Walker was informed that the remainder of the evaluation will be completed by another provider, this initial triage assessment does not replace that evaluation, and the importance of remaining in the ED until their evaluation is complete.      [name], PA-C 05/13/22 1127

## 2022-05-13 NOTE — ED Triage Notes (Signed)
Started diarrhea Saturday.  Thought it was from eating pizza.  Indigestion started it.  Tried pepto.  Continued Sunday. Still having watery diarrhea today.,

## 2022-05-20 ENCOUNTER — Other Ambulatory Visit: Payer: Self-pay | Admitting: Surgery

## 2022-05-20 DIAGNOSIS — K5732 Diverticulitis of large intestine without perforation or abscess without bleeding: Secondary | ICD-10-CM

## 2022-05-27 ENCOUNTER — Ambulatory Visit
Admission: RE | Admit: 2022-05-27 | Discharge: 2022-05-27 | Disposition: A | Discharge status: Home / Self Care | Payer: Managed Care, Other (non HMO) | Source: Ambulatory Visit | Attending: Surgery | Admitting: Surgery

## 2022-05-27 DIAGNOSIS — K5732 Diverticulitis of large intestine without perforation or abscess without bleeding: Secondary | ICD-10-CM | POA: Insufficient documentation

## 2022-05-27 MED ORDER — IOHEXOL 300 MG/ML  SOLN
100.0000 mL | Freq: Once | INTRAMUSCULAR | Status: AC | PRN
  Administered 2022-05-27: 100 mL via INTRAVENOUS

## 2022-07-15 ENCOUNTER — Emergency Department: Payer: Managed Care, Other (non HMO)

## 2022-07-15 ENCOUNTER — Emergency Department
Admission: EM | Admit: 2022-07-15 | Discharge: 2022-07-15 | Disposition: A | Discharge status: Home / Self Care | Payer: Managed Care, Other (non HMO) | Attending: Emergency Medicine | Admitting: Emergency Medicine

## 2022-07-15 ENCOUNTER — Other Ambulatory Visit: Payer: Self-pay

## 2022-07-15 DIAGNOSIS — R0789 Other chest pain: Secondary | ICD-10-CM | POA: Insufficient documentation

## 2022-07-15 DIAGNOSIS — I10 Essential (primary) hypertension: Secondary | ICD-10-CM | POA: Diagnosis not present

## 2022-07-15 DIAGNOSIS — R079 Chest pain, unspecified: Secondary | ICD-10-CM | POA: Diagnosis present

## 2022-07-15 LAB — CBC
HCT: 38.7 % (ref 36.0–46.0)
Hemoglobin: 13.5 g/dL (ref 12.0–15.0)
MCH: 32.8 pg (ref 26.0–34.0)
MCHC: 34.9 g/dL (ref 30.0–36.0)
MCV: 94.2 fL (ref 80.0–100.0)
Platelets: 157 10*3/uL (ref 150–400)
RBC: 4.11 MIL/uL (ref 3.87–5.11)
RDW: 12.5 % (ref 11.5–15.5)
WBC: 5.1 10*3/uL (ref 4.0–10.5)
nRBC: 0 % (ref 0.0–0.2)

## 2022-07-15 LAB — BASIC METABOLIC PANEL
Anion gap: 10 (ref 5–15)
BUN: 19 mg/dL (ref 8–23)
CO2: 27 mmol/L (ref 22–32)
Calcium: 9.9 mg/dL (ref 8.9–10.3)
Chloride: 103 mmol/L (ref 98–111)
Creatinine, Ser: 0.75 mg/dL (ref 0.44–1.00)
GFR, Estimated: >60 mL/min (ref >60)
Glucose, Bld: 107 mg/dL — ABNORMAL HIGH (ref 70–99)
Potassium: 4.5 mmol/L (ref 3.5–5.1)
Sodium: 140 mmol/L (ref 135–145)

## 2022-07-15 LAB — TROPONIN I (HIGH SENSITIVITY)
Troponin I (High Sensitivity): 4 ng/L (ref <18)
Troponin I (High Sensitivity): 4 ng/L (ref <18)

## 2022-07-15 NOTE — ED Notes (Signed)
Pts chest pain started on 2/4, she states it comes and goes. When it occurs, she says it effects her breathing. Denies cardiac hx. She denies chest pain and SOB at this time. Vitals are stable on the monitor. Pt alert and oriented x4.

## 2022-07-15 NOTE — ED Notes (Signed)
IV d/c'd

## 2022-07-15 NOTE — ED Provider Notes (Signed)
[facility] Provider Note   Event Date/Time   First MD Initiated Contact with Patient 07/15/22 1846     (approximate) History  Chest Pain  HPI Krystal Walker is a 67 y.o. female with a past medical history of hypertension presents for left-sided chest pain that began approximate 24 hours prior to arrival and has been stable since onset.  Patient states that it is somewhat intermittent however she denies any exacerbating or relieving factors.  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  Patient is also concerned as she has been checking her blood pressure over the last 24 hours and found to be elevated with systolic blood pressures in the 160s ROS: Patient currently denies any vision changes, tinnitus, difficulty speaking, facial droop, sore throat, shortness of breath, abdominal pain, nausea/vomiting/diarrhea, dysuria, or weakness/numbness/paresthesias in any extremity   Physical Exam  Triage Vital Signs: ED Triage Vitals  Enc Vitals Group     BP 07/15/22 1749 (!) 179/129     Pulse Rate 07/15/22 1749 92     Resp 07/15/22 1749 18     Temp 07/15/22 1749 99.2 F (37.3 C)     Temp Source 07/15/22 1749 Oral     SpO2 07/15/22 1749 96 %     Weight 07/15/22 1944 145 lb (65.8 kg)     Height 07/15/22 1944 '5\' 4"'$  (1.626 m)     Head Circumference --      Peak Flow --      Pain Score 07/15/22 1748 4     Pain Loc --      Pain Edu? --      Excl. in [place]? --    Most recent vital signs: Vitals:   07/15/22 1937 07/15/22 2134  BP:  (!) 153/95  Pulse: 78 79  Resp: 18 17  Temp:  98 F (36.7 C)  SpO2: 100% 100%   General: Awake, oriented x4. CV:  Good peripheral perfusion.  Resp:  Normal effort.  Abd:  No distention.  Other:  Elderly well-developed well-nourished Caucasian female laying in bed in no acute distress ED Results / Procedures / Treatments  Labs (all labs ordered  are listed, but only abnormal results are displayed) Labs Reviewed  BASIC METABOLIC PANEL - Abnormal; Notable for the following components:      Result Value   Glucose, Bld 107 (*)    All other components within normal limits  CBC  TROPONIN I (HIGH SENSITIVITY)  TROPONIN I (HIGH SENSITIVITY)   EKG ED ECG REPORT I, Naaman Plummer, the attending physician, personally viewed and interpreted this ECG. Date: 07/15/2022 EKG Time: 1751 Rate: 91 Rhythm: normal sinus rhythm QRS Axis: normal Intervals: normal ST/T Wave abnormalities: normal Narrative Interpretation: no evidence of acute ischemia RADIOLOGY ED MD interpretation: 2 view chest x-ray interpreted by me shows no evidence of acute abnormalities including no pneumonia, pneumothorax, or widened mediastinum -Agree with radiology assessment Official radiology report(s): DG Chest 2 View  Result Date: 07/15/2022 CLINICAL DATA:  Chest pain EXAM: CHEST - 2 VIEW COMPARISON:  CT 10/24/2019, chest x-ray 07/15/2019 FINDINGS: Stable right apical opacity presumably due to scarring. No acute airspace disease or effusion. Stable cardiomediastinal silhouette. No pneumothorax. IMPRESSION: No active cardiopulmonary disease. Stable right apical opacity presumably due to scarring. Electronically Signed   By: Donavan Foil M.D.   On: 07/15/2022 18:08   PROCEDURES: Critical Care performed: No .  1-3 Lead EKG Interpretation  Performed by: Naaman Plummer, MD Authorized by: Naaman Plummer, MD     Interpretation: normal     ECG rate:  71   ECG rate assessment: normal     Rhythm: sinus rhythm     Ectopy: none     Conduction: normal    MEDICATIONS ORDERED IN ED: Medications - No data to display IMPRESSION / MDM / [place] / ED COURSE  I reviewed the triage vital signs and the nursing notes.                             The patient is on the cardiac monitor to evaluate for evidence of arrhythmia and/or significant heart rate  changes. Patient's presentation is most consistent with acute presentation with potential threat to life or bodily function. 67 year old female with the above-stated past medical history presents for 24 hours of left-sided chest pain that is intermittent without any exacerbating or relieving factors Workup: ECG, CXR, CBC, BMP, Troponin Findings: ECG: No overt evidence of STEMI. No evidence of Brugadas sign, delta wave, epsilon wave, significantly prolonged QTc, or malignant arrhythmia HS Troponin: Negative x1 Other Labs unremarkable for emergent problems. CXR: Without PTX, PNA, or widened mediastinum Last Stress Test: Never Last Heart Catheterization: Never HEART Score: 4  Given History, Exam, and Workup I have low suspicion for ACS, Pneumothorax, Pneumonia, Pulmonary Embolus, Tamponade, Aortic Dissection or other emergent problem as a cause for this presentation.   Reassesment: Prior to discharge patients pain was controlled and they were well appearing.  Patient was encouraged to follow-up with her primary care physician within the next 1-3 days for further evaluation and management of her uncontrolled hypertension  Disposition:  Discharge. Strict return precautions discussed with patient with full understanding. Advised patient to follow up promptly with primary care provider   FINAL CLINICAL IMPRESSION(S) / ED DIAGNOSES   Final diagnoses:  Atypical chest pain  Uncontrolled hypertension   Rx / DC Orders   ED Discharge Orders     None      Note:  This document was prepared using Dragon voice recognition software and may include unintentional dictation errors.   Naaman Plummer, MD 07/15/22 [id number]

## 2022-07-15 NOTE — ED Triage Notes (Signed)
Pt comes with c/o CP that started on Sunday. Pt states left sided pain behind her breast. Pt states it comes and goes. Pt states 4/10. Pt states sob when the pain come. Pt states stabbing pain.  Pt also states BP has been elevated. Pt states she is on meds for this. Pt states she has been taking meds like she is suppose to. Pt takes them daily at night.

## 2022-07-28 ENCOUNTER — Other Ambulatory Visit: Payer: Self-pay | Admitting: Gastroenterology

## 2022-07-28 ENCOUNTER — Ambulatory Visit
Admission: RE | Admit: 2022-07-28 | Discharge: 2022-07-28 | Disposition: A | Discharge status: Home / Self Care | Payer: Managed Care, Other (non HMO) | Source: Ambulatory Visit | Attending: Gastroenterology | Admitting: Gastroenterology

## 2022-07-28 DIAGNOSIS — R1032 Left lower quadrant pain: Secondary | ICD-10-CM | POA: Diagnosis not present

## 2022-07-28 DIAGNOSIS — Z8719 Personal history of other diseases of the digestive system: Secondary | ICD-10-CM

## 2022-07-28 MED ORDER — IOHEXOL 9 MG/ML PO SOLN
500.0000 mL | ORAL | Status: AC
  Administered 2022-07-28 (×2): 500 mL via ORAL

## 2022-07-28 MED ORDER — IOHEXOL 300 MG/ML  SOLN
100.0000 mL | Freq: Once | INTRAMUSCULAR | Status: AC | PRN
  Administered 2022-07-28: 100 mL via INTRAVENOUS

## 2022-09-09 ENCOUNTER — Other Ambulatory Visit: Payer: Self-pay | Admitting: Family Medicine

## 2022-09-09 DIAGNOSIS — R221 Localized swelling, mass and lump, neck: Secondary | ICD-10-CM

## 2022-09-22 ENCOUNTER — Ambulatory Visit
Admission: RE | Admit: 2022-09-22 | Discharge: 2022-09-22 | Disposition: A | Discharge status: Home / Self Care | Payer: Managed Care, Other (non HMO) | Source: Ambulatory Visit | Attending: Family Medicine | Admitting: Family Medicine

## 2022-09-22 ENCOUNTER — Other Ambulatory Visit: Payer: Managed Care, Other (non HMO)

## 2022-09-22 DIAGNOSIS — R221 Localized swelling, mass and lump, neck: Secondary | ICD-10-CM

## 2022-09-22 MED ORDER — IOPAMIDOL (ISOVUE-300) INJECTION 61%
75.0000 mL | Freq: Once | INTRAVENOUS | Status: AC | PRN
  Administered 2022-09-22: 75 mL via INTRAVENOUS

## 2023-03-09 ENCOUNTER — Other Ambulatory Visit: Payer: Self-pay | Admitting: Family Medicine

## 2023-03-09 DIAGNOSIS — Z1231 Encounter for screening mammogram for malignant neoplasm of breast: Secondary | ICD-10-CM

## 2023-04-06 ENCOUNTER — Ambulatory Visit
Admission: RE | Admit: 2023-04-06 | Discharge: 2023-04-06 | Disposition: A | Discharge status: Home / Self Care | Payer: Managed Care, Other (non HMO) | Source: Ambulatory Visit | Attending: Family Medicine | Admitting: Family Medicine

## 2023-04-06 DIAGNOSIS — Z1231 Encounter for screening mammogram for malignant neoplasm of breast: Secondary | ICD-10-CM | POA: Diagnosis present

## 2023-05-04 ENCOUNTER — Ambulatory Visit: Payer: Managed Care, Other (non HMO)

## 2023-05-04 DIAGNOSIS — Z8719 Personal history of other diseases of the digestive system: Secondary | ICD-10-CM | POA: Diagnosis not present

## 2023-05-04 DIAGNOSIS — K5730 Diverticulosis of large intestine without perforation or abscess without bleeding: Secondary | ICD-10-CM | POA: Diagnosis not present

## 2023-05-04 DIAGNOSIS — R1031 Right lower quadrant pain: Secondary | ICD-10-CM | POA: Diagnosis not present

## 2023-05-04 DIAGNOSIS — K582 Mixed irritable bowel syndrome: Secondary | ICD-10-CM | POA: Diagnosis not present

## 2023-05-04 DIAGNOSIS — R1032 Left lower quadrant pain: Secondary | ICD-10-CM | POA: Diagnosis present

## 2023-05-04 DIAGNOSIS — K641 Second degree hemorrhoids: Secondary | ICD-10-CM | POA: Diagnosis not present

## 2023-08-10 ENCOUNTER — Other Ambulatory Visit: Payer: Self-pay | Admitting: Family Medicine

## 2023-08-10 DIAGNOSIS — Z9189 Other specified personal risk factors, not elsewhere classified: Secondary | ICD-10-CM

## 2023-08-10 DIAGNOSIS — Z0000 Encounter for general adult medical examination without abnormal findings: Secondary | ICD-10-CM

## 2023-08-19 ENCOUNTER — Ambulatory Visit
Admission: RE | Admit: 2023-08-19 | Discharge: 2023-08-19 | Disposition: A | Discharge status: Home / Self Care | Payer: Self-pay | Source: Ambulatory Visit | Attending: Family Medicine | Admitting: Family Medicine

## 2023-08-19 DIAGNOSIS — Z9189 Other specified personal risk factors, not elsewhere classified: Secondary | ICD-10-CM | POA: Insufficient documentation

## 2023-08-19 DIAGNOSIS — Z0000 Encounter for general adult medical examination without abnormal findings: Secondary | ICD-10-CM | POA: Insufficient documentation

## 2024-02-25 ENCOUNTER — Other Ambulatory Visit: Payer: Self-pay | Admitting: Family Medicine

## 2024-02-25 DIAGNOSIS — Z1231 Encounter for screening mammogram for malignant neoplasm of breast: Secondary | ICD-10-CM

## 2024-04-06 ENCOUNTER — Encounter

## 2024-05-12 ENCOUNTER — Ambulatory Visit
Admission: RE | Admit: 2024-05-12 | Discharge: 2024-05-12 | Disposition: A | Source: Ambulatory Visit | Attending: Family Medicine | Admitting: Family Medicine

## 2024-05-12 DIAGNOSIS — Z1231 Encounter for screening mammogram for malignant neoplasm of breast: Secondary | ICD-10-CM | POA: Diagnosis present
